# Patient Record
Sex: Female | Born: 1950 | Race: White | Hispanic: No | Smoking: Never smoker
Health system: Southern US, Community
[De-identification: ages and names within clinical notes are randomized; demographics above are authoritative.]

## PROBLEM LIST (undated history)

## (undated) DIAGNOSIS — F419 Anxiety disorder, unspecified: Secondary | ICD-10-CM

## (undated) DIAGNOSIS — I340 Nonrheumatic mitral (valve) insufficiency: Secondary | ICD-10-CM

## (undated) DIAGNOSIS — F32A Depression, unspecified: Secondary | ICD-10-CM

## (undated) DIAGNOSIS — C50919 Malignant neoplasm of unspecified site of unspecified female breast: Secondary | ICD-10-CM

## (undated) DIAGNOSIS — Z9221 Personal history of antineoplastic chemotherapy: Secondary | ICD-10-CM

## (undated) DIAGNOSIS — R519 Headache, unspecified: Secondary | ICD-10-CM

## (undated) DIAGNOSIS — Z923 Personal history of irradiation: Secondary | ICD-10-CM

## (undated) DIAGNOSIS — M810 Age-related osteoporosis without current pathological fracture: Secondary | ICD-10-CM

## (undated) HISTORY — DX: Nonrheumatic mitral (valve) insufficiency: I34.0

## (undated) HISTORY — PX: APPENDECTOMY: SHX54

## (undated) HISTORY — DX: Malignant neoplasm of unspecified site of unspecified female breast: C50.919

## (undated) HISTORY — DX: Depression, unspecified: F32.A

## (undated) HISTORY — DX: Headache, unspecified: R51.9

## (undated) HISTORY — PX: BREAST LUMPECTOMY: SHX2

## (undated) HISTORY — DX: Age-related osteoporosis without current pathological fracture: M81.0

## (undated) HISTORY — PX: CHOLECYSTECTOMY: SHX55

## (undated) HISTORY — DX: Anxiety disorder, unspecified: F41.9

---

## 1999-06-02 DIAGNOSIS — C50919 Malignant neoplasm of unspecified site of unspecified female breast: Secondary | ICD-10-CM

## 1999-06-02 HISTORY — DX: Malignant neoplasm of unspecified site of unspecified female breast: C50.919

## 1999-06-02 HISTORY — PX: BREAST LUMPECTOMY: SHX2

## 2010-06-01 HISTORY — PX: GALLBLADDER SURGERY: SHX652

## 2016-09-04 ENCOUNTER — Encounter: Payer: Self-pay | Admitting: Interventional Cardiology

## 2016-09-22 ENCOUNTER — Ambulatory Visit (INDEPENDENT_AMBULATORY_CARE_PROVIDER_SITE_OTHER): Payer: Medicare Other | Admitting: Interventional Cardiology

## 2016-09-22 ENCOUNTER — Encounter (INDEPENDENT_AMBULATORY_CARE_PROVIDER_SITE_OTHER): Payer: Self-pay

## 2016-09-22 ENCOUNTER — Encounter: Payer: Self-pay | Admitting: Interventional Cardiology

## 2016-09-22 VITALS — BP 120/80 | HR 51 | Ht 64.0 in | Wt 130.8 lb

## 2016-09-22 DIAGNOSIS — R5383 Other fatigue: Secondary | ICD-10-CM

## 2016-09-22 DIAGNOSIS — I340 Nonrheumatic mitral (valve) insufficiency: Secondary | ICD-10-CM

## 2016-09-22 DIAGNOSIS — R002 Palpitations: Secondary | ICD-10-CM

## 2016-09-22 NOTE — Patient Instructions (Signed)
Medication Instructions:  Your physician has recommended you make the following change in your medication:  Stop taking atenolol.   Labwork: None ordered.  Testing/Procedures: None ordered.  Follow-Up: Your physician recommends that you schedule a follow-up appointment in: 4-6 weeks with Dr. Irish Lack.   Any Other Special Instructions Will Be Listed Below (If Applicable).     If you need a refill on your cardiac medications before your next appointment, please call your pharmacy.Marland Kitchen

## 2016-09-22 NOTE — Progress Notes (Signed)
Cardiology Office Note   Date:  09/22/2016   ID:  Mariah Romero, DOB [birth year], MRN [id number]  PCP:  Marda Stalker, PA-C    No chief complaint on file.  palpitations  Wt Readings from Last 3 Encounters:  09/22/16 130 lb 12.8 oz (59.3 kg)       History of Present Illness: Mariah Romero is a 66 y.o. female who is being seen today for the evaluation of palpitations at the request of Marda Stalker, PA-C.  She was living in [place].  She was seen years ago for palpitations and she had a negative w/u with a monitor.  She was told she had MR as well.  Her last echo was several years ago.  She was supposed to have one in [place]. But the move to [place] happened before she left.  She was told that she was more sensitive to skipped beats than most.  THe patient has had low BP for many years.  Regardless, the patient was started on atenolol for palpitations.  She was told that she could take extra atenolol if beeded for palpitations.    No chest pain.  WOrk around the house is her most strenuous activity.  Since moving to [place] in Nov 2017, she has been less active.  She travels to see family as well.  No SHOB with activity.  She had more palpitations several weeks ago.       Past Medical History:  Diagnosis Date  . Breast cancer ([place]) 2001   S/P CHEMOTHERAPY  . Mitral valve regurgitation   . Osteoporosis     Past Surgical History:  Procedure Laterality Date  . APPENDECTOMY     AT AGE 75 YEARS OLD  . BREAST LUMPECTOMY     AT AGE 82 YEARS OLD     Current Outpatient Prescriptions  Medication Sig Dispense Refill  . b complex vitamins tablet Take 1 tablet by mouth daily.    Marland Kitchen Bioflavonoid Products (ESTER-C) TABS Take 1 tablet by mouth daily.    . Cholecalciferol (VITAMIN D3) LIQD by Does not apply route.    . citalopram (CELEXA) 20 MG tablet Take 20 mg by mouth daily.    . traZODone (DESYREL) 100 MG tablet Take 250 mg by mouth at bedtime.    . Turmeric 500 MG CAPS Take  1 capsule by mouth daily.    . vitamin B-12 (CYANOCOBALAMIN) 100 MCG tablet Take 100 mcg by mouth daily.     No current facility-administered medications for this visit.     Allergies:   Codeine; Ibuprofen; and Penicillins    Social History:  The patient  reports that she has never smoked. She has never used smokeless tobacco. She reports that she drinks alcohol. She reports that she does not use drugs.   Family History:  The patient's family history includes Breast cancer in her sister; CVA in her father; Dementia (age of onset: 78) in her mother; Diabetes in her mother; Hypertension in her mother; Melanoma in her father; Mental illness in her father.    ROS:  Please see the history of present illness.   Otherwise, review of systems are positive for palpitatons (reducing), and chancre sore.   All other systems are reviewed and negative.    PHYSICAL EXAM: VS:  BP 120/80 (BP Location: Left Arm, Patient Position: Sitting, Cuff Size: Normal)   Pulse (!) 51   Ht 5\' 4"  (1.626 m)   Wt 130 lb 12.8 oz (59.3 kg)   SpO2  98%   BMI 22.45 kg/m  , BMI Body mass index is 22.45 kg/m. GEN: Well nourished, well developed, in no acute distress  HEENT: normal  Neck: no JVD, carotid bruits, or masses Cardiac: RRR; 1/6 systolic murmur,; no rubs, or gallops,no edema  Respiratory:  clear to auscultation bilaterally, normal work of breathing GI: soft, nontender, nondistended, + BS MS: no deformity or atrophy  Skin: warm and dry, no rash Neuro:  Strength and sensation are intact Psych: euthymic mood, full affect   EKG:   The ekg ordered today demonstrates sinus bradycardia, no ST segment changes   Recent Labs: No results found for requested labs within last 8760 hours.   Lipid Panel No results found for: CHOL, TRIG, HDL, CHOLHDL, VLDL, LDLCALC, LDLDIRECT   Other studies Reviewed: Additional studies/ records that were reviewed today with results demonstrating: echo and Holter pending from old  records.   ASSESSMENT AND PLAN:  1. Mitral regurgitation: Mild by exam.  She brought a disk that has records.  She will get [place] the echo report and Holter monitor.  She was going to have an echo in [place]. But will wait to see her old report.  Nothing worrisome on exam.   2. Fatigue: Hopefully, this will improve with less atenolol.  She also has a h/o depression.   3. Palpitations: Improved.  Holter in the past was benign per her report.  Should be ok off of atenolol.  Currently bradycardic.  If this HR increases, her energy level may improve.   Current medicines are reviewed at length with the patient today.  The patient concerns regarding her medicines were addressed.  The following changes have been made:  No change Ball[place] and Labs/ tests ordered today include:   Orders Placed This Encounter  Procedures  . EKG 12-Lead    Recommend 150 minutes/week of aerobic exercise Low fat, low carb, high fiber diet recommended  Disposition:   FU in 4-6 weeks to check on HR, BP and palpitations   Signed, Larae Grooms, MD  09/22/2016 3:14 PM    [facility] [place], [place], [place]  [address] Phone: [phone]; Fax: [phone]

## 2016-10-30 ENCOUNTER — Ambulatory Visit: Payer: Medicare Other | Admitting: Interventional Cardiology

## 2016-11-05 ENCOUNTER — Ambulatory Visit: Payer: Medicare Other | Admitting: Interventional Cardiology

## 2016-11-24 ENCOUNTER — Encounter: Payer: Self-pay | Admitting: Interventional Cardiology

## 2016-11-24 ENCOUNTER — Ambulatory Visit (INDEPENDENT_AMBULATORY_CARE_PROVIDER_SITE_OTHER): Payer: Medicare Other | Admitting: Interventional Cardiology

## 2016-11-24 VITALS — BP 130/68 | HR 72 | Ht 64.0 in | Wt 131.8 lb

## 2016-11-24 DIAGNOSIS — I340 Nonrheumatic mitral (valve) insufficiency: Secondary | ICD-10-CM

## 2016-11-24 DIAGNOSIS — R002 Palpitations: Secondary | ICD-10-CM | POA: Diagnosis not present

## 2016-11-24 DIAGNOSIS — R5383 Other fatigue: Secondary | ICD-10-CM | POA: Diagnosis not present

## 2016-11-24 NOTE — Progress Notes (Signed)
Cardiology Office Note   Date:  11/24/2016   ID:  Mariah Romero, DOB 07-04-1950, MRN [id number]  PCP:  Marda Stalker, PA-C    No chief complaint on file. palpitations   Wt Readings from Last 3 Encounters:  11/24/16 131 lb 12.8 oz (59.8 kg)  09/22/16 130 lb 12.8 oz (59.3 kg)       History of Present Illness: Mariah Romero is a 66 y.o. female  Who has had palpitations.  She was living in [place].  She was seen years ago for palpitations and she had a negative w/u with a monitor.  She was told she had MR as well.  Her last echo was several years ago.  She was supposed to have one in [place]. But the move to [place] happened before she left.  She was told that she was more sensitive to skipped beats than most.  THe patient has had low BP for many years.  Regardless, the patient was started on atenolol for palpitations.  She was told that she could take extra atenolol if beeded for palpitations.    She had more palpitations and saw me in April 2018.  We stopped her beta blocker.  Since then, her energy is improved, but she feels more skipped beats.  They typically occur at night.  She is not exercising regularly.  She has silver sneakers available.  Denies : Chest pain. Dizziness. Leg edema. Nitroglycerin use. Orthopnea. Paroxysmal nocturnal dyspnea. Shortness of breath. Syncope.        Past Medical History:  Diagnosis Date  . Breast cancer ([place]) 2001   S/P CHEMOTHERAPY  . Mitral valve regurgitation   . Osteoporosis     Past Surgical History:  Procedure Laterality Date  . APPENDECTOMY     AT AGE 34 YEARS OLD  . BREAST LUMPECTOMY     AT AGE 70 YEARS OLD     Current Outpatient Prescriptions  Medication Sig Dispense Refill  . b complex vitamins tablet Take 1 tablet by mouth daily.    . Cholecalciferol (VITAMIN D3) LIQD by Does not apply route.    . citalopram (CELEXA) 20 MG tablet Take 20 mg by mouth daily.    . traZODone (DESYREL) 100 MG tablet Take 250 mg by  mouth at bedtime.    . Turmeric 500 MG CAPS Take 1 capsule by mouth daily.    . vitamin B-12 (CYANOCOBALAMIN) 100 MCG tablet Take 100 mcg by mouth daily.     No current facility-administered medications for this visit.     Allergies:   Codeine; Ibuprofen; and Penicillins    Social History:  The patient  reports that she has never smoked. She has never used smokeless tobacco. She reports that she drinks alcohol. She reports that she does not use drugs.   Family History:  The patient's family history includes Breast cancer in her sister; CVA in her father; Dementia (age of onset: 17) in her mother; Diabetes in her mother; Hypertension in her mother; Melanoma in her father; Mental illness in her father.    ROS:  Please see the history of present illness.   Otherwise, review of systems are positive for .   All other systems are reviewed and negative.    PHYSICAL EXAM: VS:  BP 130/68   Pulse 72   Ht 5\' 4"  (1.626 m)   Wt 131 lb 12.8 oz (59.8 kg)   SpO2 98%   BMI 22.62 kg/m  , BMI Body mass index is 22.62  kg/m. GEN: Well nourished, well developed, in no acute distress  HEENT: normal  Neck: no JVD, carotid bruits, or masses Cardiac: RRR; 2/6 early systolic murmur, no rubs, or gallops,no edema  Respiratory:  clear to auscultation bilaterally, normal work of breathing GI: soft, nontender, nondistended, + BS MS: no deformity or atrophy  Skin: warm and dry, no rash Neuro:  Strength and sensation are intact Psych: euthymic mood, full affect     Recent Labs: No results found for requested labs within last 8760 hours.   Lipid Panel No results found for: CHOL, TRIG, HDL, CHOLHDL, VLDL, LDLCALC, LDLDIRECT   Other studies Reviewed: Additional studies/ records that were reviewed today with results demonstrating: 2016 echo in [place]. Showed normal LVEF with moderate MR. 2005 Holter showed NSR, no AFib with only PACs. No SVT.   ASSESSMENT AND PLAN:  1. Mitral regurgitation:  Noted by her  prior cardiologist as moderate.  Will check echo and call her with results. 2. Palpitaitons: PACs documented in the past.  Sx are not bothersome.  Only occur when she is still.  She prefers being off of the beta blocker.  3. Fatigue: Improved off of beta blocker.   Current medicines are reviewed at length with the patient today.  The patient concerns regarding her medicines were addressed.  The following changes have been made:  No change  Labs/ tests ordered today include: echo  Orders Placed This Encounter  Procedures  . ECHOCARDIOGRAM COMPLETE    Recommend 150 minutes/week of aerobic exercise Low fat, low carb, high fiber diet recommended  Disposition:   FU in 1 year   Signed, Larae Grooms, MD  11/24/2016 1:02 PM    [facility] [place], [place], [place]  [address] Phone: [phone]; Fax: [phone]

## 2016-11-24 NOTE — Patient Instructions (Signed)
Medication Instructions:  Your physician recommends that you continue on your current medications as directed. Please refer to the Current Medication list given to you today.   Labwork: None ordered  Testing/Procedures: Your physician has requested that you have an echocardiogram. Echocardiography is a painless test that uses sound waves to create images of your heart. It provides your doctor with information about the size and shape of your heart and how well your heart's chambers and valves are working. This procedure takes approximately one hour. There are no restrictions for this procedure.  Follow-Up: Your physician wants you to follow-up in: 1 year with Dr. Varanasi. You will receive a reminder letter in the mail two months in advance. If you don't receive a letter, please call our office to schedule the follow-up appointment.   Any Other Special Instructions Will Be Listed Below (If Applicable).  Echocardiogram An echocardiogram, or echocardiography, uses sound waves (ultrasound) to produce an image of your heart. The echocardiogram is simple, painless, obtained within a short period of time, and offers valuable information to your health care provider. The images from an echocardiogram can provide information such as:  Evidence of coronary artery disease (CAD).  Heart size.  Heart muscle function.  Heart valve function.  Aneurysm detection.  Evidence of a past heart attack.  Fluid buildup around the heart.  Heart muscle thickening.  Assess heart valve function.  Tell a health care provider about:  Any allergies you have.  All medicines you are taking, including vitamins, herbs, eye drops, creams, and over-the-counter medicines.  Any problems you or family members have had with anesthetic medicines.  Any blood disorders you have.  Any surgeries you have had.  Any medical conditions you have.  Whether you are pregnant or may be pregnant. What happens before  the procedure? No special preparation is needed. Eat and drink normally. What happens during the procedure?  In order to produce an image of your heart, gel will be applied to your chest and a wand-like tool (transducer) will be moved over your chest. The gel will help transmit the sound waves from the transducer. The sound waves will harmlessly bounce off your heart to allow the heart images to be captured in real-time motion. These images will then be recorded.  You may need an IV to receive a medicine that improves the quality of the pictures. What happens after the procedure? You may return to your normal schedule including diet, activities, and medicines, unless your health care provider tells you otherwise. This information is not intended to replace advice given to you by your health care provider. Make sure you discuss any questions you have with your health care provider. Document Released: 05/15/2000 Document Revised: 01/04/2016 Document Reviewed: 01/23/2013 Elsevier Interactive Patient Education  2017 Elsevier Inc.    If you need a refill on your cardiac medications before your next appointment, please call your pharmacy.   

## 2016-12-08 ENCOUNTER — Ambulatory Visit (HOSPITAL_COMMUNITY): Payer: Medicare Other | Attending: Internal Medicine

## 2016-12-08 ENCOUNTER — Other Ambulatory Visit: Payer: Self-pay

## 2016-12-08 DIAGNOSIS — I340 Nonrheumatic mitral (valve) insufficiency: Secondary | ICD-10-CM

## 2016-12-08 DIAGNOSIS — I081 Rheumatic disorders of both mitral and tricuspid valves: Secondary | ICD-10-CM | POA: Insufficient documentation

## 2017-10-07 ENCOUNTER — Ambulatory Visit: Payer: Medicare Other | Admitting: Internal Medicine

## 2017-10-07 ENCOUNTER — Ambulatory Visit (INDEPENDENT_AMBULATORY_CARE_PROVIDER_SITE_OTHER)
Admission: RE | Admit: 2017-10-07 | Discharge: 2017-10-07 | Disposition: A | Discharge status: Home / Self Care | Payer: Medicare Other | Source: Ambulatory Visit | Attending: Internal Medicine | Admitting: Internal Medicine

## 2017-10-07 ENCOUNTER — Encounter: Payer: Self-pay | Admitting: Internal Medicine

## 2017-10-07 VITALS — BP 130/74 | HR 67 | Ht 64.0 in | Wt 132.0 lb

## 2017-10-07 DIAGNOSIS — J45991 Cough variant asthma: Secondary | ICD-10-CM | POA: Insufficient documentation

## 2017-10-07 LAB — NITRIC OXIDE: Nitric Oxide: 9

## 2017-10-07 NOTE — Progress Notes (Signed)
Subjective:     Patient ID: Mariah Romero, female   DOB: 1950/07/08,    MRN: [id number]  HPI  22 yowf never smoker healthy childhood growing up in [place] dx breast ca 2001  rx s/p chemo adriamycin/cytoxan/taxotere / RT R side completed around 2001 > shortly thereafter developed runny nose year round ever since "constant"  Daytime but  not noct then cough started around 2014 also year round but also absent sleeping but present p first cup of coffee so referred to pulmonary clinic 10/07/2017 by Dr   Harlene Ramus.   10/07/2017 1st [facility] office visit/ Araf Clugston   Chief Complaint  Patient presents with  . Pulmonary Consult    Referred by Dr. Radene Ou. Pt c/o cough for the past several years. She states she usually coughs up clear sputum, esp worse in the morning. She states when she coughs you can hear wheezing. She states "feel like there's a lump in my throat".    persistent daily watery rhinitis x 2001 and cough x 2014  Good ex tol but ? More severe cough  with walking  Uses lots of cough drops/ otc cold /allergy meds don't help or aren't tol well   Cleaning L ear provokes cough x 30 y  On prednisone now for tennis elbow x one week no effect on nose or cough     Kouffman Reflux v Neurogenic Cough Differentiator Reflux Comments  Do you awaken from a sound sleep coughing violently?                            With trouble breathing? 2-3 mon   Do you have choking episodes when you cannot  Get enough air, gasping for air ?              Yes lose voice   Do you usually cough when you lie down into  The bed, or when you just lie down to rest ?                          Every noct   Do you usually cough after meals or eating?         No    Do you cough when (or after) you bend over?    no   GERD SCORE     Kouffman Reflux v Neurogenic Cough Differentiator Neurogenic   Do you more-or-less cough all day long? sporadic   Does change of temperature make you cough? no   Does laughing or  chuckling cause you to cough? often   Do fumes (perfume, automobile fumes, burned  Toast, etc.,) cause you to cough ?      chlorox   Does speaking, singing, or talking on the phone cause you to cough   ?               Yes some    Neurogenic/Airway score      No obvious day to day or daytime variability or assoc excess/ purulent sputum or mucus plugs or hemoptysis or cp or chest tightness, subjective wheeze or overt sinus or hb symptoms. No unusual exposure hx or h/o childhood pna/ asthma or knowledge of premature birth.  Sleeping  Flat ok   without nocturnal  or early am exacerbation  of respiratory  c/o's or need for noct saba. Also denies any obvious fluctuation of symptoms with weather or environmental changes or other aggravating  or alleviating factors except as outlined above   Current Allergies, Complete Past Medical History, Past Surgical History, Family History, and Social History were reviewed in [employer] record.  ROS  The following are not active complaints unless bolded Hoarseness, sore throat, dysphagia, dental problems, itching, sneezing,  nasal congestion or discharge of excess watery mucus  or purulent secretions, ear ache,   fever, chills, sweats, unintended wt loss or wt gain, classically pleuritic or exertional cp,  orthopnea pnd or arm/hand swelling  or leg swelling, presyncope, palpitations, abdominal pain, anorexia, nausea, vomiting, diarrhea  or change in bowel habits or change in bladder habits, change in stools or change in urine, dysuria, hematuria,  rash, arthralgias, visual complaints, headache, numbness, weakness or ataxia or problems with walking or coordination,  change in mood or  memory.        Current Meds  Medication Sig  . b complex vitamins tablet Take 1 tablet by mouth daily.  . Cholecalciferol (VITAMIN D3) LIQD by Does not apply route.  . citalopram (CELEXA) 20 MG tablet Take 20 mg by mouth daily.  . traZODone (DESYREL) 100 MG  tablet Take 250 mg by mouth at bedtime.  . Turmeric 500 MG CAPS Take 1 capsule by mouth daily.  . vitamin B-12 (CYANOCOBALAMIN) 100 MCG tablet Take 100 mcg by mouth daily.     Review of Systems     Objective:   Physical Exam amb wf nad freq throat clearing    Wt Readings from Last 3 Encounters:  10/07/17 132 lb (59.9 kg)  11/24/16 131 lb 12.8 oz (59.8 kg)  09/22/16 130 lb 12.8 oz (59.3 kg)     Vital signs reviewed - Note on arrival 02 sats  96% on RA        HEENT: nl dentition, turbinates bilaterally, and oropharynx. Nl external ear canals with cough reflex on L    NECK :  without JVD/Nodes/TM/ nl carotid upstrokes bilaterally   LUNGS: no acc muscle use,  Nl contour chest which is clear to A and P bilaterally with cough on   exp maneuvers   CV:  RRR  no s3 or murmur or increase in P2, and no edema   ABD:  soft and nontender with nl inspiratory excursion in the supine position. No bruits or organomegaly appreciated, bowel sounds nl  MS:  Nl gait/ ext warm without deformities, calf tenderness, cyanosis or clubbing No obvious joint restrictions   SKIN: warm and dry without lesions    NEURO:  alert, approp, nl sensorium with  no motor or cerebellar deficits apparent.    CXR PA and Lateral:   10/07/2017 :    I personally reviewed images and  impression as follows:    pleural thickening R apex o/w wnl      Assessment:

## 2017-10-07 NOTE — Patient Instructions (Addendum)
Dysmista one puff twice daily in each nostril until you use it up and refill it if it's helping.  GERD (REFLUX)  is an extremely common cause of respiratory symptoms just like yours , many times with no obvious heartburn at all.    It can be treated with medication, but also with lifestyle changes including elevation of the head of your bed (ideally with 6 inch  bed blocks),  Smoking cessation, avoidance of late meals, excessive alcohol, and avoid fatty foods, chocolate, peppermint, colas, red wine, and acidic juices such as orange juice.  NO MINT OR MENTHOL PRODUCTS SO NO COUGH DROPS   USE SUGARLESS CANDY INSTEAD (Jolley ranchers or Stover's or Life Savers) or even ice chips will also do - the key is to swallow to prevent all throat clearing. NO OIL BASED VITAMINS - use powdered substitutes.  For drainage / throat tickle try take CHLORPHENIRAMINE  4 mg - take one every 4 hours as needed - available over the counter- may cause drowsiness so start with just a  dose or two one hour before bedtime and see how you tolerate it before trying in daytime    If still coughing :  Next step try prilosec otc 20mg   Take 30-60 min before first meal of the day and Pepcid ac (famotidine) 20 mg one hour before @  bedtime until return - both are over the counter but have to be taken perfectly regularly to be of help      Please remember to go to the  x-ray department downstairs in the basement  for your tests - we will call you with the results when they are available.      Please schedule a follow up office visit in 4 weeks, sooner if needed

## 2017-10-08 ENCOUNTER — Encounter: Payer: Self-pay | Admitting: Internal Medicine

## 2017-10-08 MED ORDER — AZELASTINE-FLUTICASONE 137-50 MCG/ACT NA SUSP: 1.0000 | Freq: Two times a day (BID) | NASAL | Status: DC

## 2017-10-08 NOTE — Progress Notes (Signed)
Spoke with pt and notified of results per Dr. Wert. Pt verbalized understanding and denied any questions. 

## 2017-10-08 NOTE — Assessment & Plan Note (Addendum)
L ear cough since she was in her 30's  - Spirometry 10/07/2017 wnl prior to any saba while on pred for elbow  -  FENO 10/07/2017  =   9  On prednisone for elbow which did not help cough   - 10/07/2017 trial of dymista and 1st gen H1 blockers per guidelines  If tol    The most common causes of chronic cough in immunocompetent adults include the following: upper airway cough syndrome (UACS), previously referred to as postnasal drip syndrome (PNDS), which is caused by variety of rhinosinus conditions; (2) asthma; (3) GERD; (4) chronic bronchitis from cigarette smoking or other inhaled environmental irritants; (5) nonasthmatic eosinophilic bronchitis; and (6) bronchiectasis.   These conditions, singly or in combination, have accounted for up to 94% of the causes of chronic cough in prospective studies.   Other conditions have constituted no >6% of the causes in prospective studies These have included bronchogenic carcinoma, chronic interstitial pneumonia, sarcoidosis, left ventricular failure, ACEI-induced cough, and aspiration from a condition associated with pharyngeal dysfunction.    Chronic cough is often simultaneously caused by more than one condition. A single cause has been found from 38 to 82% of the time, multiple causes from 18 to 62%. Multiply caused cough has been the result of three diseases up to 42% of the time.    Longstanding pnds suggest this is the mech for cough esp assoc with L vagal cough reflex and the lack of resolution on pred is against eos rhinitis/ bronchits and asthma but the cough on exp is typical of asthma so there is no clear dx here.  Pt insists she doesn't do well with meds so reasoable to work thru the ddx of chronic cough step by step starting with addressing her sense of pnds first but helping her to understand that throat clearing just fuels the fire and she may be able to prevent this by something simple  using non min/ menthol containing hard rock candies.  In  meantime will try to eliminate cholinergically mediated cough with Dymista/1st gen H1 blockers per guidelines  If tol and if not then try singulair/ allergy testing when returns  Reviewed in detail: The standardized cough guidelines published in Chest by Lissa Morales in 2006 are still the best available and consist of a multiple step process (up to 12!) , not a single office visit,  and are intended  to address this problem logically,  with an alogrithm dependent on response to empiric treatment at  each progressive step  to determine a specific diagnosis with  minimal addtional testing needed. Therefore if adherence is an issue or can't be accurately verified,  it's very unlikely the standard evaluation and treatment will be successful here.    Furthermore, response to therapy (other than acute cough suppression, which should only be used short term with avoidance of narcotic containing cough syrups if possible), can be a gradual process for which the patient is not likely to  perceive immediate benefit.  Unlike going to an eye doctor where the best perscription is almost always the first one and is immediately effective, this is almost never the case in the management of chronic cough syndromes. Therefore the patient needs to commit up front to consistently adhere to recommendations  for up to 6 weeks of therapy directed at the likely underlying problem(s) before the response can be reasonably evaluated.    Total time devoted to counseling  > 50 % of initial 60 min office visit:  review case with pt/ discussion of options/alternatives/ personally creating written customized instructions  in presence of pt  then going over those specific  Instructions directly with the pt including how to use all of the meds but in particular covering each new medication in detail and the difference between the maintenance= "automatic" meds and the prns using an action plan format for the latter (If this problem/symptom  => do that organization reading Left to right).  Please see AVS from this visit for a full list of these instructions which I personally wrote for this pt and  are unique to this visit.     device teaching [facility])  extended face to face time for this visit

## 2017-11-05 ENCOUNTER — Ambulatory Visit: Payer: Medicare Other | Admitting: Internal Medicine

## 2018-06-23 ENCOUNTER — Other Ambulatory Visit: Payer: Self-pay | Admitting: Gastroenterology

## 2018-06-23 DIAGNOSIS — R1084 Generalized abdominal pain: Secondary | ICD-10-CM

## 2018-06-29 ENCOUNTER — Encounter: Payer: Self-pay | Admitting: Radiology

## 2018-06-29 ENCOUNTER — Ambulatory Visit
Admission: RE | Admit: 2018-06-29 | Discharge: 2018-06-29 | Disposition: A | Discharge status: Home / Self Care | Payer: Medicare Other | Source: Ambulatory Visit | Attending: Gastroenterology | Admitting: Gastroenterology

## 2018-06-29 DIAGNOSIS — R1084 Generalized abdominal pain: Secondary | ICD-10-CM

## 2018-06-29 MED ORDER — IOPAMIDOL (ISOVUE-300) INJECTION 61%
100.0000 mL | Freq: Once | INTRAVENOUS | Status: AC | PRN
  Administered 2018-06-29: 100 mL via INTRAVENOUS

## 2019-03-14 ENCOUNTER — Other Ambulatory Visit: Payer: Self-pay | Admitting: Family Medicine

## 2019-03-14 DIAGNOSIS — Z13820 Encounter for screening for osteoporosis: Secondary | ICD-10-CM

## 2019-04-18 ENCOUNTER — Other Ambulatory Visit: Payer: Self-pay | Admitting: Family Medicine

## 2019-04-18 DIAGNOSIS — Z1231 Encounter for screening mammogram for malignant neoplasm of breast: Secondary | ICD-10-CM

## 2019-06-21 ENCOUNTER — Ambulatory Visit
Admission: RE | Admit: 2019-06-21 | Discharge: 2019-06-21 | Disposition: A | Discharge status: Home / Self Care | Payer: Medicare Other | Source: Ambulatory Visit | Attending: Family Medicine | Admitting: Family Medicine

## 2019-06-21 ENCOUNTER — Other Ambulatory Visit: Payer: Self-pay

## 2019-06-21 DIAGNOSIS — Z1231 Encounter for screening mammogram for malignant neoplasm of breast: Secondary | ICD-10-CM

## 2019-06-21 DIAGNOSIS — Z13820 Encounter for screening for osteoporosis: Secondary | ICD-10-CM

## 2019-06-26 ENCOUNTER — Telehealth: Payer: Self-pay | Admitting: Internal Medicine

## 2019-06-26 NOTE — Telephone Encounter (Signed)
Called the patient back and advised of response. Patient voiced understanding. Nothing further needed at this time.

## 2019-06-26 NOTE — Telephone Encounter (Signed)
Yes, needs to get it, no contraindication to my knowledge

## 2019-06-26 NOTE — Telephone Encounter (Signed)
Dr. Melvyn Novas, this patient has not been seen by you since 10/07/17. Based on that office note, is there any reason why she should not obtain vaccination? Please advise, thank you.

## 2019-07-31 ENCOUNTER — Ambulatory Visit: Payer: Medicare Other | Attending: Internal Medicine

## 2019-07-31 DIAGNOSIS — Z23 Encounter for immunization: Secondary | ICD-10-CM | POA: Insufficient documentation

## 2019-07-31 NOTE — Progress Notes (Signed)
   Covid-19 Vaccination Clinic  Name:  Mariah Romero    MRN: [id number] DOB: 10-15-1950  07/31/2019  Ms. Willmann was observed post Covid-19 immunization for 15 minutes without incidence. She was provided with Vaccine Information Sheet and instruction to access the V-Safe system.   Ms. Barfield was instructed to call 911 with any severe reactions post vaccine: Marland Kitchen Difficulty breathing  . Swelling of your face and throat  . A fast heartbeat  . A bad rash all over your body  . Dizziness and weakness    Immunizations Administered    Name Date Dose VIS Date Route   Pfizer COVID-19 Vaccine 07/31/2019 11:47 AM 0.3 mL 05/12/2019 Intramuscular   Manufacturer: [place]   Lot: [id number]   [place]: [id number]

## 2019-08-29 ENCOUNTER — Ambulatory Visit: Payer: Medicare Other | Attending: Internal Medicine

## 2019-08-29 DIAGNOSIS — Z23 Encounter for immunization: Secondary | ICD-10-CM

## 2019-08-29 NOTE — Progress Notes (Signed)
   Covid-19 Vaccination Clinic  Name:  Mariah Romero    MRN: [id number] DOB: 1950-11-14  08/29/2019  [name] was observed post Covid-19 immunization for 15 minutes without incident. She was provided with Vaccine Information Sheet and instruction to access the V-Safe system.   [name] was instructed to call 911 with any severe reactions post vaccine: Marland Kitchen Difficulty breathing  . Swelling of face and throat  . A fast heartbeat  . A bad rash all over body  . Dizziness and weakness   Immunizations Administered    Name Date Dose VIS Date Route   Pfizer COVID-19 Vaccine 08/29/2019 12:01 PM 0.3 mL 05/12/2019 Intramuscular   Manufacturer: [employer]   Lot: [id number]   [place]: [id number]

## 2019-09-11 NOTE — Progress Notes (Signed)
Cardiology Office Note   Date:  09/12/2019   ID:  Mariah Romero, DOB 1951/04/25, MRN [id number]  PCP:  Vernie Shanks, MD    No chief complaint on file.  Mitral regurgitation  Wt Readings from Last 3 Encounters:  09/12/19 122 lb (55.3 kg)  10/07/17 132 lb (59.9 kg)  11/24/16 131 lb 12.8 oz (59.8 kg)       History of Present Illness: Mariah Romero is a 70 y.o. female   Who has had palpitations.  She was living in [place]. She was seen years ago for palpitations and she had a negative w/u with a monitor. She was told she had MR as well. Her last echo was several years ago. She was supposed to have one in [place]. But the move to [place] happened before she left. She was told that she was more sensitive to skipped beats than most.  THe patient has had low BP for many years. Regardless, the patient was started on atenolol for palpitations. She was told that she could take extra atenolol if beeded for palpitations.   She had more palpitations and saw me in April 2018.  We stopped her beta blocker.    After that, her energy improved, but she felt more skipped beats.  They typically occurred at night.  Since the last visit, she has done well. It took about a month to adjust to being off atenolol.  She is happy she is off of this.  She is cutting down on the trazodone.   She is trying to get more walking in.  Her mother with Alzheimers moved in to her home.   Denies : Chest pain. Dizziness. Leg edema. Nitroglycerin use. Orthopnea. Palpitations. Paroxysmal nocturnal dyspnea.  Syncope.    She has some DOE.    Past Medical History:  Diagnosis Date  . Breast cancer ([place]) 2001   S/P CHEMOTHERAPY  . Mitral valve regurgitation   . Osteoporosis     Past Surgical History:  Procedure Laterality Date  . APPENDECTOMY     AT AGE 67 YEARS OLD  . BREAST LUMPECTOMY Right 2001   AT AGE 47 YEARS OLD     Current Outpatient Medications  Medication Sig Dispense Refill  .  Cholecalciferol (VITAMIN D3) LIQD by Does not apply route.    . citalopram (CELEXA) 20 MG tablet Take 20 mg by mouth daily.    . Multiple Vitamins-Minerals (CENTRUM SILVER PO) Take 1 tablet by mouth daily.    . traZODone (DESYREL) 50 MG tablet Take 50 mg by mouth at bedtime.     . vitamin B-12 (CYANOCOBALAMIN) 100 MCG tablet Take 1,000 mcg by mouth daily.      No current facility-administered medications for this visit.    Allergies:   Codeine, Oxycodone, Ibuprofen, and Penicillins    Social History:  The patient  reports that she has never smoked. She has never used smokeless tobacco. She reports current alcohol use. She reports that she does not use drugs.   Family History:  The patient's family history includes Asthma in her sister; Breast cancer in her sister; CVA in her father; Dementia (age of onset: 60) in her mother; Diabetes in her mother; Emphysema in her paternal grandmother; Hypertension in her mother; Melanoma in her father; Mental illness in her father.    ROS:  Please see the history of present illness.   Otherwise, review of systems are positive for stress, from mother moving in.   All other systems are  reviewed and negative.    PHYSICAL EXAM: VS:  BP 134/78   Pulse 63   Ht 5\' 4"  (1.626 m)   Wt 122 lb (55.3 kg)   SpO2 99%   BMI 20.94 kg/m  , BMI Body mass index is 20.94 kg/m. GEN: Well nourished, well developed, in no acute distress  HEENT: normal  Neck: no JVD, carotid bruits, or masses Cardiac: RRR; no murmurs, rubs, or gallops,no edema  Respiratory:  clear to auscultation bilaterally, normal work of breathing GI: soft, nontender, nondistended, + BS MS: no deformity or atrophy  Skin: warm and dry, no rash Neuro:  Strength and sensation are intact Psych: euthymic mood, full affect   EKG:   The ekg ordered today demonstrates NSR, RBBB   Recent Labs: No results found for requested labs within last 8760 hours.   Lipid Panel No results found for: CHOL,  TRIG, HDL, CHOLHDL, VLDL, LDLCALC, LDLDIRECT   Other studies Reviewed: Additional studies/ records that were reviewed today with results demonstrating: LDL 124  In 8/18.   ASSESSMENT AND PLAN:  1. Mitral regurgitation: Noted to be moderate in the past while in [place].  It was mild on the July 2018 echocardiogram done in [place].   2. Palpitations: She has had PACs documented in the past.  She was treated on atenolol in the past.  She feels better off of atenolol.  3. Fatigue: This was worse when she was taking a beta-blocker. 4. DOE: Mild.  She feels this is a chronic pulmonary issue.    Increase exercise to improve stamina.  SHe is interested in more exercise to help prevent dementia.    Current medicines are reviewed at length with the patient today.  The patient concerns regarding her medicines were addressed.  The following changes have been made:  No change  Labs/ tests ordered today include:  No orders of the defined types were placed in this encounter.   Recommend 150 minutes/week of aerobic exercise Low fat, low carb, high fiber diet recommended  Disposition:   FU in 1 year   Signed, Larae Grooms, MD  09/12/2019 2:28 PM    [facility] [address], [place], [place]  [address] Phone: [phone]; Fax: [phone]

## 2019-09-12 ENCOUNTER — Ambulatory Visit: Payer: Medicare Other | Admitting: Interventional Cardiology

## 2019-09-12 ENCOUNTER — Other Ambulatory Visit: Payer: Self-pay

## 2019-09-12 ENCOUNTER — Encounter: Payer: Self-pay | Admitting: Interventional Cardiology

## 2019-09-12 VITALS — BP 134/78 | HR 63 | Ht 64.0 in | Wt 122.0 lb

## 2019-09-12 DIAGNOSIS — I340 Nonrheumatic mitral (valve) insufficiency: Secondary | ICD-10-CM | POA: Diagnosis not present

## 2019-09-12 DIAGNOSIS — R002 Palpitations: Secondary | ICD-10-CM

## 2019-09-12 DIAGNOSIS — R5383 Other fatigue: Secondary | ICD-10-CM

## 2019-09-12 NOTE — Patient Instructions (Signed)
Medication Instructions:  Your physician recommends that you continue on your current medications as directed. Please refer to the Current Medication list given to you today.  *If you need a refill on your cardiac medications before your next appointment, please call your pharmacy*   Lab Work: None ordered  If you have labs (blood work) drawn today and your tests are completely normal, you will receive your results only by: Marland Kitchen MyChart Message (if you have MyChart) OR . A paper copy in the mail If you have any lab test that is abnormal or we need to change your treatment, we will call you to review the results.   Testing/Procedures: None ordered   Follow-Up: At [facility], you and your health needs are our priority.  As part of our continuing mission to provide you with exceptional heart care, we have created designated Provider Care Teams.  These Care Teams include your primary Cardiologist (physician) and Advanced Practice Providers (APPs -  Physician Assistants and Nurse Practitioners) who all work together to provide you with the care you need, when you need it.  We recommend signing up for the patient portal called "MyChart".  Sign up information is provided on this After Visit Summary.  MyChart is used to connect with patients for Virtual Visits (Telemedicine).  Patients are able to view lab/test results, encounter notes, upcoming appointments, etc.  Non-urgent messages can be sent to your provider as well.   To learn more about what you can do with MyChart, go to [email or web address].    Your next appointment:   2 year(s)  The format for your next appointment:   In Person  Provider:   You may see Casandra Doffing, MD or one of the following Advanced Practice Providers on your designated Care Team:    Melina Copa, PA-C  Ermalinda Barrios, PA-C    Other Instructions  High-Fiber Diet Fiber, also called dietary fiber, is a type of carbohydrate that is found in fruits,  vegetables, whole grains, and beans. A high-fiber diet can have many health benefits. Your health care provider may recommend a high-fiber diet to help:  Prevent constipation. Fiber can make your bowel movements more regular.  Lower your cholesterol.  Relieve the following conditions: ? Swelling of veins in the anus (hemorrhoids). ? Swelling and irritation (inflammation) of specific areas of the digestive tract (uncomplicated diverticulosis). ? A problem of the large intestine (colon) that sometimes causes pain and diarrhea (irritable bowel syndrome, IBS).  Prevent overeating as part of a weight-loss plan.  Prevent heart disease, type 2 diabetes, and certain cancers. What is my plan? The recommended daily fiber intake in grams (g) includes:  38 g for men age 70 or younger.  30 g for men over age 42.  46 g for women age 59 or younger.  21 g for women over age 84. You can get the recommended daily intake of dietary fiber by:  Eating a variety of fruits, vegetables, grains, and beans.  Taking a fiber supplement, if it is not possible to get enough fiber through your diet. What do I need to know about a high-fiber diet?  It is better to get fiber through food sources rather than from fiber supplements. There is not a lot of research about how effective supplements are.  Always check the fiber content on the nutrition facts label of any prepackaged food. Look for foods that contain 5 g of fiber or more per serving.  Talk with a diet and nutrition specialist (  dietitian) if you have questions about specific foods that are recommended or not recommended for your medical condition, especially if those foods are not listed below.  Gradually increase how much fiber you consume. If you increase your intake of dietary fiber too quickly, you may have bloating, cramping, or gas.  Drink plenty of water. Water helps you to digest fiber. What are tips for following this plan?  Eat a wide  variety of high-fiber foods.  Make sure that half of the grains that you eat each day are whole grains.  Eat breads and cereals that are made with whole-grain flour instead of refined flour or white flour.  Eat brown rice, bulgur wheat, or millet instead of white rice.  Start the day with a breakfast that is high in fiber, such as a cereal that contains 5 g of fiber or more per serving.  Use beans in place of meat in soups, salads, and pasta dishes.  Eat high-fiber snacks, such as berries, raw vegetables, nuts, and popcorn.  Choose whole fruits and vegetables instead of processed forms like juice or sauce. What foods can I eat?  Fruits Berries. Pears. Apples. Oranges. Avocado. Prunes and raisins. Dried figs. Vegetables Sweet potatoes. Spinach. Kale. Artichokes. Cabbage. Broccoli. Cauliflower. Green peas. Carrots. Squash. Grains Whole-grain breads. Multigrain cereal. Oats and oatmeal. Brown rice. Barley. Bulgur wheat. Millet. Quinoa. Bran muffins. Popcorn. Rye wafer crackers. Meats and other proteins Navy, kidney, and pinto beans. Soybeans. Split peas. Lentils. Nuts and seeds. Dairy Fiber-fortified yogurt. Beverages Fiber-fortified soy milk. Fiber-fortified orange juice. Other foods Fiber bars. The items listed above may not be a complete list of recommended foods and beverages. Contact a dietitian for more options. What foods are not recommended? Fruits Fruit juice. Cooked, strained fruit. Vegetables Fried potatoes. Canned vegetables. Well-cooked vegetables. Grains White bread. Pasta made with refined flour. White rice. Meats and other proteins Fatty cuts of meat. Fried chicken or fried fish. Dairy Milk. Yogurt. Cream cheese. Sour cream. Fats and oils Butters. Beverages Soft drinks. Other foods Cakes and pastries. The items listed above may not be a complete list of foods and beverages to avoid. Contact a dietitian for more information. Summary  Fiber is a type of  carbohydrate. It is found in fruits, vegetables, whole grains, and beans.  There are many health benefits of eating a high-fiber diet, such as preventing constipation, lowering blood cholesterol, helping with weight loss, and reducing your risk of heart disease, diabetes, and certain cancers.  Gradually increase your intake of fiber. Increasing too fast can result in cramping, bloating, and gas. Drink plenty of water while you increase your fiber.  The best sources of fiber include whole fruits and vegetables, whole grains, nuts, seeds, and beans. This information is not intended to replace advice given to you by your health care provider. Make sure you discuss any questions you have with your health care provider. Document Revised: 03/22/2017 Document Reviewed: 03/22/2017 Elsevier Patient Education  2020 [employer]   

## 2020-01-19 ENCOUNTER — Emergency Department (HOSPITAL_BASED_OUTPATIENT_CLINIC_OR_DEPARTMENT_OTHER): Payer: Medicare Other

## 2020-01-19 ENCOUNTER — Other Ambulatory Visit: Payer: Self-pay

## 2020-01-19 ENCOUNTER — Encounter (HOSPITAL_BASED_OUTPATIENT_CLINIC_OR_DEPARTMENT_OTHER): Payer: Self-pay | Admitting: *Deleted

## 2020-01-19 ENCOUNTER — Emergency Department (HOSPITAL_BASED_OUTPATIENT_CLINIC_OR_DEPARTMENT_OTHER)
Admission: EM | Admit: 2020-01-19 | Discharge: 2020-01-19 | Disposition: A | Discharge status: Home / Self Care | Payer: Medicare Other | Attending: Emergency Medicine | Admitting: Emergency Medicine

## 2020-01-19 DIAGNOSIS — R519 Headache, unspecified: Secondary | ICD-10-CM | POA: Insufficient documentation

## 2020-01-19 DIAGNOSIS — Z20822 Contact with and (suspected) exposure to covid-19: Secondary | ICD-10-CM | POA: Insufficient documentation

## 2020-01-19 DIAGNOSIS — J019 Acute sinusitis, unspecified: Secondary | ICD-10-CM | POA: Diagnosis not present

## 2020-01-19 DIAGNOSIS — R509 Fever, unspecified: Secondary | ICD-10-CM | POA: Insufficient documentation

## 2020-01-19 DIAGNOSIS — Z853 Personal history of malignant neoplasm of breast: Secondary | ICD-10-CM | POA: Diagnosis not present

## 2020-01-19 DIAGNOSIS — R5383 Other fatigue: Secondary | ICD-10-CM | POA: Insufficient documentation

## 2020-01-19 DIAGNOSIS — R05 Cough: Secondary | ICD-10-CM | POA: Insufficient documentation

## 2020-01-19 DIAGNOSIS — R059 Cough, unspecified: Secondary | ICD-10-CM

## 2020-01-19 LAB — CBC WITH DIFFERENTIAL/PLATELET
Abs Immature Granulocytes: 0.02 10*3/uL (ref 0.00–0.07)
Basophils Absolute: 0 10*3/uL (ref 0.0–0.1)
Basophils Relative: 1 %
Eosinophils Absolute: 0.1 10*3/uL (ref 0.0–0.5)
Eosinophils Relative: 2 %
HCT: 39.9 % (ref 36.0–46.0)
Hemoglobin: 13 g/dL (ref 12.0–15.0)
Immature Granulocytes: 0 %
Lymphocytes Relative: 15 %
Lymphs Abs: 1.1 10*3/uL (ref 0.7–4.0)
MCH: 29.6 pg (ref 26.0–34.0)
MCHC: 32.6 g/dL (ref 30.0–36.0)
MCV: 90.9 fL (ref 80.0–100.0)
Monocytes Absolute: 0.7 10*3/uL (ref 0.1–1.0)
Monocytes Relative: 10 %
Neutro Abs: 5.4 10*3/uL (ref 1.7–7.7)
Neutrophils Relative %: 72 %
Platelets: 267 10*3/uL (ref 150–400)
RBC: 4.39 MIL/uL (ref 3.87–5.11)
RDW: 12.8 % (ref 11.5–15.5)
WBC: 7.4 10*3/uL (ref 4.0–10.5)
nRBC: 0 % (ref 0.0–0.2)

## 2020-01-19 LAB — SARS CORONAVIRUS 2 BY RT PCR (HOSPITAL ORDER, PERFORMED IN ~~LOC~~ HOSPITAL LAB): SARS Coronavirus 2: NEGATIVE

## 2020-01-19 LAB — BASIC METABOLIC PANEL
Anion gap: 9 (ref 5–15)
BUN: 13 mg/dL (ref 8–23)
CO2: 26 mmol/L (ref 22–32)
Calcium: 9.9 mg/dL (ref 8.9–10.3)
Chloride: 97 mmol/L — ABNORMAL LOW (ref 98–111)
Creatinine, Ser: 0.58 mg/dL (ref 0.44–1.00)
GFR calc Af Amer: >60 mL/min (ref >60)
GFR calc non Af Amer: >60 mL/min (ref >60)
Glucose, Bld: 121 mg/dL — ABNORMAL HIGH (ref 70–99)
Potassium: 3.8 mmol/L (ref 3.5–5.1)
Sodium: 132 mmol/L — ABNORMAL LOW (ref 135–145)

## 2020-01-19 MED ORDER — PREDNISONE 20 MG PO TABS: 40.0000 mg | ORAL_TABLET | Freq: Every day | ORAL | 0 refills | Status: AC

## 2020-01-19 MED ORDER — PREDNISONE 20 MG PO TABS: 40.0000 mg | ORAL_TABLET | Freq: Every day | ORAL | 0 refills | Status: DC

## 2020-01-19 MED ORDER — DOXYCYCLINE HYCLATE 100 MG PO TABS: 100.0000 mg | ORAL_TABLET | Freq: Two times a day (BID) | ORAL | 0 refills | Status: DC

## 2020-01-19 MED ORDER — DOXYCYCLINE HYCLATE 100 MG PO TABS: 100.0000 mg | ORAL_TABLET | Freq: Two times a day (BID) | ORAL | 0 refills | Status: AC

## 2020-01-19 NOTE — Discharge Instructions (Addendum)
Take antibiotics as directed. Please take all of your antibiotics until finished.  Use prednisone as directed.   You can use the flonase you have for congestion.  You have RMSF titers pending. These will take a few days to come back. If your results are abnormal, you will be notified.   Return to the Emergency Dept for any difficulty breathing,

## 2020-01-19 NOTE — ED Triage Notes (Signed)
Cough x 15 days. Covid test x 2 have been negative. No pneumonia on CXR. Fever continues. No known Covid exposure.

## 2020-01-19 NOTE — ED Provider Notes (Signed)
[facility]ERGENCY DEPARTMENT Provider Note   CSN: [id number] Arrival date & time: 01/19/20  1645     History Chief Complaint  Patient presents with  . Cough    Mariah Romero is a 69 y.o. female asthma history of breast cancer, atrial valve regurgitation who presents for evaluation of persistent cough, fever, fatigue x3 days.  Patient reports that she was in [place] for the last 5 weeks.  She reports that while she was up there, she started developing a cough.  She states that her normal body temperature is 97 and states that she had been consistently between 98-99 with one episode of a temperature of 100.4.  She states that this is higher than her normal temperature.  She states she has continued to have persistent cough that is sometimes productive of phlegm.  She states she has felt fatigued with no energy.  She has also had intermittent headache.  She does not frequently get headaches.  This headache is not worse with positional changes.  She states it is a headache that is a dull ache over the entire part of her head.  She states that sometimes she has some nausea but denies any vomiting.  She has gotten Covid vaccinated.  Does not know of any COVID-19 exposure.  She was seen twice in [place] for evaluation of symptoms.  She had negative Covid test at that time.  Her chest x-ray was unremarkable.  She was discharged home on a 7-day course of doxycycline which she states she completed.  She denies any chest pain, abdominal pain, vomiting, numbness/weakness of arms or legs.  She does not know of any tick bite, insect bite.  She has not had any rash.  The history is provided by the patient.       Past Medical History:  Diagnosis Date  . Breast cancer ([place]) 2001   S/P CHEMOTHERAPY  . Mitral valve regurgitation   . Osteoporosis     Patient Active Problem List   Diagnosis Date Noted  . Cough variant asthma vs uacs  10/07/2017  . Mitral valve insufficiency 09/22/2016     Past Surgical History:  Procedure Laterality Date  . APPENDECTOMY     AT AGE 54 YEARS OLD  . BREAST LUMPECTOMY Right 2001   AT AGE 46 YEARS OLD     OB History   No obstetric history on file.     Family History  Problem Relation Age of Onset  . Dementia Mother 62  . Diabetes Mother   . Hypertension Mother   . Melanoma Father   . Mental illness Father   . CVA Father   . Breast cancer Sister   . Emphysema Paternal Grandmother        never smoker  . Asthma Sister        had asthma as a child    Social History   Tobacco Use  . Smoking status: Never Smoker  . Smokeless tobacco: Never Used  Vaping Use  . Vaping Use: Never used  Substance Use Topics  . Alcohol use: Yes  . Drug use: No    Home Medications Prior to Admission medications   Medication Sig Start Date End Date Taking? Authorizing Provider  Cholecalciferol (VITAMIN D3) LIQD by Does not apply route.   Yes [provider]  Multiple Vitamins-Minerals (CENTRUM SILVER PO) Take 1 tablet by mouth daily.   Yes [provider]  traZODone (DESYREL) 50 MG tablet Take 50 mg by mouth at  bedtime.    Yes [provider]  vitamin B-12 (CYANOCOBALAMIN) 100 MCG tablet Take 1,000 mcg by mouth daily.    Yes [provider]  citalopram (CELEXA) 20 MG tablet Take 20 mg by mouth daily.    [provider]  doxycycline (VIBRA-TABS) 100 MG tablet Take 1 tablet (100 mg total) by mouth 2 (two) times daily for 7 days. 01/19/20 01/26/20  Volanda Napoleon, PA-C  predniSONE (DELTASONE) 20 MG tablet Take 2 tablets (40 mg total) by mouth daily for 4 days. 01/19/20 01/23/20  Volanda Napoleon, PA-C    Allergies    Codeine, Oxycodone, Ibuprofen, and Penicillins  Review of Systems   Review of Systems  Constitutional: Positive for fatigue and fever.  Respiratory: Positive for cough. Negative for shortness of breath.   Cardiovascular: Negative for chest pain.  Gastrointestinal: Negative for  abdominal pain, nausea and vomiting.  Genitourinary: Negative for dysuria and hematuria.  Neurological: Positive for headaches. Negative for weakness and numbness.  All other systems reviewed and are negative.   Physical Exam Updated Vital Signs BP 135/67 (BP Location: Left Arm)   Pulse 68   Temp 98.3 F (36.8 C) (Oral)   Resp 18   Ht 5\' 4"  (1.626 m)   Wt 53.1 kg   SpO2 99%   BMI 20.08 kg/m   Physical Exam Vitals and nursing note reviewed.  Constitutional:      Appearance: Normal appearance. She is well-developed.     Comments: Sitting comfortably on examination table  HENT:     Head: Normocephalic and atraumatic.  Eyes:     General: Lids are normal.     Conjunctiva/sclera: Conjunctivae normal.     Pupils: Pupils are equal, round, and reactive to light.     Comments: PERRL. EOMs intact. No nystagmus. No neglect.   Neck:     Comments: Neck is supple and without rigidity.  Cardiovascular:     Rate and Rhythm: Normal rate and regular rhythm.     Pulses: Normal pulses.     Heart sounds: Normal heart sounds. No murmur heard.  No friction rub. No gallop.   Pulmonary:     Effort: Pulmonary effort is normal.     Breath sounds: Normal breath sounds.     Comments: Lungs clear to auscultation bilaterally.  Symmetric chest rise.  No wheezing, rales, rhonchi. Abdominal:     Palpations: Abdomen is soft. Abdomen is not rigid.     Tenderness: There is no abdominal tenderness. There is no guarding.     Comments: Abdomen is soft, non-distended, non-tender. No rigidity, No guarding. No peritoneal signs.  Musculoskeletal:        General: Normal range of motion.     Cervical back: Full passive range of motion without pain.  Skin:    General: Skin is warm and dry.     Capillary Refill: Capillary refill takes less than 2 seconds.     Comments: No rash  Neurological:     Mental Status: She is alert and oriented to person, place, and time.     Comments: Cranial nerves III-XII  intact Follows commands, Moves all extremities  5/5 strength to BUE and BLE  Sensation intact throughout all major nerve distributions No gait abnormalities  No slurred speech. No facial droop.   Psychiatric:        Speech: Speech normal.     ED Results / Procedures / Treatments   Labs (all labs ordered are listed, but only abnormal  results are displayed) Labs Reviewed  BASIC METABOLIC PANEL - Abnormal; Notable for the following components:      Result Value   Sodium 132 (*)    Chloride 97 (*)    Glucose, Bld 121 (*)    All other components within normal limits  SARS CORONAVIRUS 2 BY RT PCR (HOSPITAL ORDER, [facility])  CBC WITH DIFFERENTIAL/PLATELET  ROCKY MTN SPOTTED FVR ABS PNL(IGG+IGM)    EKG None  Radiology CT Head Wo Contrast  Result Date: 01/19/2020 CLINICAL DATA:  Headache EXAM: CT HEAD WITHOUT CONTRAST TECHNIQUE: Contiguous axial images were obtained from the base of the skull through the vertex without intravenous contrast. COMPARISON:  None. FINDINGS: Brain: No acute territorial infarction, hemorrhage, or intracranial mass. The ventricles are nonenlarged. Vascular: No hyperdense vessel or unexpected calcification. Skull: Normal. Negative for fracture or focal lesion. Sinuses/Orbits: Fluid level in the right maxillary sinus. Other: None IMPRESSION: 1. No CT evidence for acute intracranial abnormality. 2. Right maxillary sinusitis Electronically Signed   By: Donavan Foil M.D.   On: 01/19/2020 20:32   DG Chest Port 1 View  Result Date: 01/19/2020 CLINICAL DATA:  Cough, congestion and diarrhea x2 weeks. EXAM: PORTABLE CHEST 1 VIEW COMPARISON:  November 09, 2017 FINDINGS: There is no evidence of acute infiltrate, pleural effusion or pneumothorax. The heart size and mediastinal contours are within normal limits. The visualized skeletal structures are unremarkable. IMPRESSION: No active disease. Electronically Signed   By: Virgina Norfolk M.D.   On:  01/19/2020 18:33    Procedures Procedures (including critical care time)  Medications Ordered in ED Medications - No data to display  ED Course  I have reviewed the triage vital signs and the nursing notes.  Pertinent labs & imaging results that were available during my care of the patient were reviewed by me and considered in my medical decision making (see chart for details).    MDM Rules/Calculators/A&P                          69 year old female who presents for evaluation of 3 weeks of persistent cough, generalized fatigue, intermittent fever, headache.  She reports that she was in [place] when symptoms started.  She was seen in [place] had 2 - Covid test.  She was discharged on a course of antibiotics which she has completed.  She states that despite taking doxycycline, she continues to have cough, feeling fatigued.  She states that her normal temperature is 97 and at home, she has been running between 98-99.  She had one episode of 100.4 initial onset of symptoms.  None since.  On initially arrival, she is afebrile, nontoxic-appearing.  Vital signs are stable.  On exam, she has no evidence of rash.  No neuro deficits noted.  Consider viral infection versus COVID-19.  Also given that she has 3 weeks of persistent headache which is abnormal for her, will plan for CT head for any evaluation of acute space-occupying lesion.  History/physical exam not concerning for meningitis, CVA, dural venous thrombosis.  Also, consider [facility] spotted fever.  No history of tick bites and no rash noted on exam but given that she has had persistent symptoms, will plan for RMSF titers.  BMP shows normal BUN and creatinine.  Covid negative.  CBC shows no leukocytosis.  Hemoglobin stable.  Chest x-ray negative for any acute abnormality.  CT head shows right sided sinusitis. No other intracranial abnormalities.  I did discuss with patient.  She is still symptomatic with congestion.  We will plan for  another course of doxycycline.  Also encouraged use of Flonase.  Patient instructed that she has [facility] spotted fever titer sent. At this time, patient exhibits no emergent life-threatening condition that require further evaluation in ED or admission. Patient had ample opportunity for questions and discussion. All patient's questions were answered with full understanding. Strict return precautions discussed. Patient expresses understanding and agreement to plan.   Aleenah Winger was evaluated in Emergency Department on 01/19/2020 for the symptoms described in the history of present illness. She was evaluated in the context of the global COVID-19 pandemic, which necessitated consideration that the patient might be at risk for infection with the SARS-CoV-2 virus that causes COVID-19. Institutional protocols and algorithms that pertain to the evaluation of patients at risk for COVID-19 are in a state of rapid change based on information released by regulatory bodies including the CDC and federal and state organizations. These policies and algorithms were followed during the patient's care in the ED.  Portions of this note were generated with [employer]. Dictation errors may occur despite best attempts at proofreading   Final Clinical Impression(s) / ED Diagnoses Final diagnoses:  Cough  Acute sinusitis, recurrence not specified, unspecified location    Rx / DC Orders ED Discharge Orders         Ordered    doxycycline (VIBRA-TABS) 100 MG tablet  2 times daily,   Status:  Discontinued        01/19/20 2110    predniSONE (DELTASONE) 20 MG tablet  Daily,   Status:  Discontinued        01/19/20 2110    doxycycline (VIBRA-TABS) 100 MG tablet  2 times daily        01/19/20 2111    predniSONE (DELTASONE) 20 MG tablet  Daily        01/19/20 2111           Volanda Napoleon, PA-C 01/19/20 2325    Lucrezia Starch, MD 01/22/20 [phone]

## 2020-01-27 LAB — ROCKY MTN SPOTTED FVR ABS PNL(IGG+IGM)
RMSF IgG: NEGATIVE
RMSF IgM: 0.2 index (ref 0.00–0.89)

## 2020-06-01 ENCOUNTER — Encounter (HOSPITAL_BASED_OUTPATIENT_CLINIC_OR_DEPARTMENT_OTHER): Payer: Self-pay | Admitting: Emergency Medicine

## 2020-06-01 DIAGNOSIS — S0512XA Contusion of eyeball and orbital tissues, left eye, initial encounter: Secondary | ICD-10-CM | POA: Insufficient documentation

## 2020-06-01 DIAGNOSIS — Z853 Personal history of malignant neoplasm of breast: Secondary | ICD-10-CM | POA: Diagnosis not present

## 2020-06-01 DIAGNOSIS — Y92002 Bathroom of unspecified non-institutional (private) residence single-family (private) house as the place of occurrence of the external cause: Secondary | ICD-10-CM | POA: Diagnosis not present

## 2020-06-01 DIAGNOSIS — S0990XA Unspecified injury of head, initial encounter: Secondary | ICD-10-CM | POA: Insufficient documentation

## 2020-06-01 DIAGNOSIS — R42 Dizziness and giddiness: Secondary | ICD-10-CM | POA: Diagnosis not present

## 2020-06-01 DIAGNOSIS — R519 Headache, unspecified: Secondary | ICD-10-CM | POA: Diagnosis not present

## 2020-06-01 DIAGNOSIS — W19XXXA Unspecified fall, initial encounter: Secondary | ICD-10-CM | POA: Diagnosis not present

## 2020-06-01 NOTE — ED Triage Notes (Addendum)
Per spouse, he found pt on the floor of the bathroom. States she drank ETOH last night. Also c/o N/V. C/o pain to R side of head and bruising around L eye. Last alcohol 645a.

## 2020-06-02 ENCOUNTER — Emergency Department (HOSPITAL_BASED_OUTPATIENT_CLINIC_OR_DEPARTMENT_OTHER): Payer: Medicare Other

## 2020-06-02 ENCOUNTER — Other Ambulatory Visit: Payer: Self-pay

## 2020-06-02 ENCOUNTER — Emergency Department (HOSPITAL_BASED_OUTPATIENT_CLINIC_OR_DEPARTMENT_OTHER)
Admission: EM | Admit: 2020-06-02 | Discharge: 2020-06-02 | Disposition: A | Discharge status: Home / Self Care | Payer: Medicare Other | Attending: Emergency Medicine | Admitting: Emergency Medicine

## 2020-06-02 DIAGNOSIS — W19XXXA Unspecified fall, initial encounter: Secondary | ICD-10-CM

## 2020-06-02 DIAGNOSIS — S0512XA Contusion of eyeball and orbital tissues, left eye, initial encounter: Secondary | ICD-10-CM

## 2020-06-02 DIAGNOSIS — R519 Headache, unspecified: Secondary | ICD-10-CM | POA: Diagnosis not present

## 2020-06-02 DIAGNOSIS — S0990XA Unspecified injury of head, initial encounter: Secondary | ICD-10-CM

## 2020-06-02 DIAGNOSIS — R42 Dizziness and giddiness: Secondary | ICD-10-CM

## 2020-06-02 DIAGNOSIS — Y92009 Unspecified place in unspecified non-institutional (private) residence as the place of occurrence of the external cause: Secondary | ICD-10-CM

## 2020-06-02 LAB — BASIC METABOLIC PANEL
Anion gap: 13 (ref 5–15)
BUN: 16 mg/dL (ref 8–23)
CO2: 23 mmol/L (ref 22–32)
Calcium: 9.5 mg/dL (ref 8.9–10.3)
Chloride: 100 mmol/L (ref 98–111)
Creatinine, Ser: 0.67 mg/dL (ref 0.44–1.00)
GFR, Estimated: >60 mL/min (ref >60)
Glucose, Bld: 84 mg/dL (ref 70–99)
Potassium: 3.7 mmol/L (ref 3.5–5.1)
Sodium: 136 mmol/L (ref 135–145)

## 2020-06-02 MED ORDER — ONDANSETRON 8 MG PO TBDP: 8.0000 mg | ORAL_TABLET | Freq: Three times a day (TID) | ORAL | 0 refills | Status: DC | PRN

## 2020-06-02 MED ORDER — MECLIZINE HCL 25 MG PO TABS: 25.0000 mg | ORAL_TABLET | Freq: Three times a day (TID) | ORAL | 0 refills | Status: DC | PRN

## 2020-06-02 MED ORDER — ONDANSETRON HCL 4 MG/2ML IJ SOLN
4.0000 mg | Freq: Once | INTRAMUSCULAR | Status: AC
  Administered 2020-06-02: 4 mg via INTRAVENOUS
  Filled 2020-06-02: qty 2

## 2020-06-02 MED ORDER — SODIUM CHLORIDE 0.9 % IV BOLUS
1000.0000 mL | Freq: Once | INTRAVENOUS | Status: AC
  Administered 2020-06-02: 1000 mL via INTRAVENOUS

## 2020-06-02 MED ORDER — DIPHENHYDRAMINE HCL 50 MG/ML IJ SOLN
25.0000 mg | Freq: Once | INTRAMUSCULAR | Status: AC
  Administered 2020-06-02: 25 mg via INTRAVENOUS
  Filled 2020-06-02: qty 1

## 2020-06-02 NOTE — ED Provider Notes (Signed)
[facility] Provider Note: Mariah Dell, MD, FACEP  CSN: [id number] MRN: [id number] ARRIVAL: 06/01/20 at 1821 ROOM: [facility]13/[facility]   CHIEF COMPLAINT  Fall   HISTORY OF PRESENT ILLNESS  06/02/20 3:14 AM Mariah Romero is a 70 y.o. female who had "too much to drink" the night before last (New Year's Eve).  She fell and hit her head at least once but possibly more than once.  She has a left periorbital hematoma.  She has had nausea and vomiting as well as dizziness.  The dizziness is a sense of being off balance and the room spinning.  It is worse with standing or with movement of her head.  She rates the pain in her head and around her left eye is a 7 out of 10, worse with movement.  It is unknown if she lost consciousness as she was found on the floor by her husband.  Her last drink was about 6:45 AM yesterday.  She has not had anything to eat since.   Past Medical History:  Diagnosis Date  . Breast cancer ([facility]) 2001   S/P CHEMOTHERAPY  . Mitral valve regurgitation   . Osteoporosis     Past Surgical History:  Procedure Laterality Date  . APPENDECTOMY     AT AGE 45 YEARS OLD  . BREAST LUMPECTOMY Right 2001   AT AGE 5 YEARS OLD    Family History  Problem Relation Age of Onset  . Dementia Mother 63  . Diabetes Mother   . Hypertension Mother   . Melanoma Father   . Mental illness Father   . CVA Father   . Breast cancer Sister   . Emphysema Paternal Grandmother        never smoker  . Asthma Sister        had asthma as a child    Social History   Tobacco Use  . Smoking status: Never Smoker  . Smokeless tobacco: Never Used  Vaping Use  . Vaping Use: Never used  Substance Use Topics  . Alcohol use: Yes  . Drug use: No    Prior to Admission medications   Medication Sig Start Date End Date Taking? Authorizing Provider  meclizine (ANTIVERT) 25 MG tablet Take 1 tablet (25 mg total) by mouth 3 (three) times daily as needed for dizziness. 06/02/20  Yes Shahan Starks,  Jozi Malachi, MD  ondansetron (ZOFRAN ODT) 8 MG disintegrating tablet Take 1 tablet (8 mg total) by mouth every 8 (eight) hours as needed for nausea or vomiting. 06/02/20  Yes Mercia Dowe, MD  Cholecalciferol (VITAMIN D3) LIQD by Does not apply route.    [provider]  citalopram (CELEXA) 20 MG tablet Take 20 mg by mouth daily.    [provider]  Multiple Vitamins-Minerals (CENTRUM SILVER PO) Take 1 tablet by mouth daily.    [provider]  traZODone (DESYREL) 50 MG tablet Take 50 mg by mouth at bedtime.     [provider]  vitamin B-12 (CYANOCOBALAMIN) 100 MCG tablet Take 1,000 mcg by mouth daily.     [provider]    Allergies Codeine, Oxycodone, Ibuprofen, and Penicillins   REVIEW OF SYSTEMS  Negative except as noted here or in the History of Present Illness.   PHYSICAL EXAMINATION  Initial Vital Signs Blood pressure (!) 154/62, pulse (!) 101, temperature 98.9 F (37.2 C), temperature source Oral, resp. rate 18, height 5\' 4"  (1.626 m), weight 54.4 kg, SpO2 99 %.  Examination General: Well-developed, well-nourished  female in no acute distress; appearance consistent with age of record HENT: normocephalic; no hemotympanum; left periorbital hematoma:    Eyes: pupils equal, round and reactive to light; extraocular muscles intact; no nystagmus Neck: supple; nontender Heart: regular rate and rhythm Lungs: clear to auscultation bilaterally Abdomen: soft; nondistended; nontender; bowel sounds present Extremities: No deformity; full range of motion; pulses normal Neurologic: Awake, alert and oriented; motor function intact in all extremities and symmetric; no facial droop Skin: Warm and dry Psychiatric: Normal mood and affect   RESULTS  Summary of this visit's results, reviewed and interpreted by myself:   EKG Interpretation  Date/Time:    Ventricular Rate:    PR Interval:    QRS Duration:   QT Interval:    QTC Calculation:   R  Axis:     Text Interpretation:        Laboratory Studies: Results for orders placed or performed during the hospital encounter of 06/02/20 (from the past 24 hour(s))  Basic metabolic panel     Status: None   Collection Time: 06/02/20  3:37 AM  Result Value Ref Range   Sodium 136 135 - 145 mmol/L   Potassium 3.7 3.5 - 5.1 mmol/L   Chloride 100 98 - 111 mmol/L   CO2 23 22 - 32 mmol/L   Glucose, Bld 84 70 - 99 mg/dL   BUN 16 8 - 23 mg/dL   Creatinine, Ser 9.14 0.44 - 1.00 mg/dL   Calcium 9.5 8.9 - 78.2 mg/dL   GFR, Estimated >95 >62 mL/min   Anion gap 13 5 - 15   Imaging Studies: CT Head Wo Contrast  Result Date: 06/02/2020 CLINICAL DATA:  Found down. Right-sided head pain. Left periorbital bruising. EXAM: CT HEAD WITHOUT CONTRAST TECHNIQUE: Contiguous axial images were obtained from the base of the skull through the vertex without intravenous contrast. COMPARISON:  01/19/2020 FINDINGS: Brain: There is no mass, hemorrhage or extra-axial collection. The size and configuration of the ventricles and extra-axial CSF spaces are normal. The brain parenchyma is normal, without acute or chronic infarction. Vascular: No abnormal hyperdensity of the major intracranial arteries or dural venous sinuses. No intracranial atherosclerosis. Skull: The visualized skull base, calvarium and extracranial soft tissues are normal. Sinuses/Orbits: No fluid levels or advanced mucosal thickening of the visualized paranasal sinuses. No mastoid or middle ear effusion. The orbits are normal. IMPRESSION: Normal head CT. Electronically Signed   By: Deatra Robinson M.D.   On: 06/02/2020 03:49    ED COURSE and MDM  Nursing notes, initial and subsequent vitals signs, including pulse oximetry, reviewed and interpreted by myself.  Vitals:   06/01/20 1831 06/01/20 2150 06/02/20 0058 06/02/20 0333  BP: (!) 144/65 (!) 147/68 (!) 154/62 (!) 148/60  Pulse: 88 (!) 112 (!) 101 (!) 104  Resp: 18 20 18 16   Temp: 98.4 F (36.9 C)  98.5 F (36.9 C) 98.9 F (37.2 C)   TempSrc: Oral Oral Oral   SpO2: 100% 99% 99% 100%  Weight:      Height:       Medications  sodium chloride 0.9 % bolus 1,000 mL (1,000 mLs Intravenous New Bag/Given 06/02/20 0331)  ondansetron (ZOFRAN) injection 4 mg (4 mg Intravenous Given 06/02/20 0331)  diphenhydrAMINE (BENADRYL) injection 25 mg (25 mg Intravenous Given 06/02/20 0331)   4:12 AM The patient is likely dehydrated from overindulgence and subsequent poor oral intake.  No evidence of intracranial injury.   PROCEDURES  Procedures   ED DIAGNOSES  ICD-10-CM   1. Fall in home, initial encounter  W19.XXXA    Y92.009   2. Minor head injury, initial encounter  S09.90XA   3. Periorbital contusion of left eye, initial encounter  S05.12XA   4. Vertigo  R42        Mandalyn Pasqua, Jonny Ruiz, MD 06/02/20 [phone]

## 2020-06-02 NOTE — ED Notes (Signed)
Patient transported to CT 

## 2020-06-02 NOTE — ED Notes (Signed)
Discharge instructions and medications discussed with patient. Verbalized understanding. Departs ED at this time in wheelchair in stable condition.

## 2020-06-03 ENCOUNTER — Ambulatory Visit (INDEPENDENT_AMBULATORY_CARE_PROVIDER_SITE_OTHER): Payer: Medicare Other

## 2020-06-03 ENCOUNTER — Other Ambulatory Visit: Payer: Self-pay | Admitting: Family Medicine

## 2020-06-03 DIAGNOSIS — G319 Degenerative disease of nervous system, unspecified: Secondary | ICD-10-CM | POA: Diagnosis not present

## 2020-06-03 DIAGNOSIS — W19XXXA Unspecified fall, initial encounter: Secondary | ICD-10-CM

## 2020-06-03 DIAGNOSIS — S0512XA Contusion of eyeball and orbital tissues, left eye, initial encounter: Secondary | ICD-10-CM | POA: Diagnosis not present

## 2020-06-03 DIAGNOSIS — R110 Nausea: Secondary | ICD-10-CM | POA: Diagnosis not present

## 2020-06-03 DIAGNOSIS — S0990XA Unspecified injury of head, initial encounter: Secondary | ICD-10-CM | POA: Diagnosis not present

## 2020-06-03 DIAGNOSIS — R519 Headache, unspecified: Secondary | ICD-10-CM | POA: Diagnosis not present

## 2020-06-24 DIAGNOSIS — D225 Melanocytic nevi of trunk: Secondary | ICD-10-CM | POA: Diagnosis not present

## 2020-06-24 DIAGNOSIS — L814 Other melanin hyperpigmentation: Secondary | ICD-10-CM | POA: Diagnosis not present

## 2020-06-24 DIAGNOSIS — D1801 Hemangioma of skin and subcutaneous tissue: Secondary | ICD-10-CM | POA: Diagnosis not present

## 2020-06-24 DIAGNOSIS — L820 Inflamed seborrheic keratosis: Secondary | ICD-10-CM | POA: Diagnosis not present

## 2020-06-24 DIAGNOSIS — L738 Other specified follicular disorders: Secondary | ICD-10-CM | POA: Diagnosis not present

## 2020-06-24 DIAGNOSIS — L821 Other seborrheic keratosis: Secondary | ICD-10-CM | POA: Diagnosis not present

## 2020-06-24 DIAGNOSIS — L918 Other hypertrophic disorders of the skin: Secondary | ICD-10-CM | POA: Diagnosis not present

## 2020-07-04 DIAGNOSIS — R197 Diarrhea, unspecified: Secondary | ICD-10-CM | POA: Diagnosis not present

## 2020-07-04 DIAGNOSIS — R1011 Right upper quadrant pain: Secondary | ICD-10-CM | POA: Diagnosis not present

## 2020-07-26 DIAGNOSIS — C50919 Malignant neoplasm of unspecified site of unspecified female breast: Secondary | ICD-10-CM | POA: Diagnosis not present

## 2020-07-26 DIAGNOSIS — M810 Age-related osteoporosis without current pathological fracture: Secondary | ICD-10-CM | POA: Diagnosis not present

## 2020-07-26 DIAGNOSIS — I10 Essential (primary) hypertension: Secondary | ICD-10-CM | POA: Diagnosis not present

## 2020-08-13 DIAGNOSIS — Z01812 Encounter for preprocedural laboratory examination: Secondary | ICD-10-CM | POA: Diagnosis not present

## 2020-08-16 DIAGNOSIS — D122 Benign neoplasm of ascending colon: Secondary | ICD-10-CM | POA: Diagnosis not present

## 2020-08-16 DIAGNOSIS — K635 Polyp of colon: Secondary | ICD-10-CM | POA: Diagnosis not present

## 2020-08-16 DIAGNOSIS — K5730 Diverticulosis of large intestine without perforation or abscess without bleeding: Secondary | ICD-10-CM | POA: Diagnosis not present

## 2020-08-16 DIAGNOSIS — K648 Other hemorrhoids: Secondary | ICD-10-CM | POA: Diagnosis not present

## 2020-08-16 DIAGNOSIS — Z1211 Encounter for screening for malignant neoplasm of colon: Secondary | ICD-10-CM | POA: Diagnosis not present

## 2020-08-20 DIAGNOSIS — K635 Polyp of colon: Secondary | ICD-10-CM | POA: Diagnosis not present

## 2020-08-20 DIAGNOSIS — D122 Benign neoplasm of ascending colon: Secondary | ICD-10-CM | POA: Diagnosis not present

## 2020-08-28 ENCOUNTER — Telehealth: Payer: Self-pay | Admitting: Interventional Cardiology

## 2020-08-28 NOTE — Telephone Encounter (Signed)
I spoke with patient.  She reports after eating breakfast she will have a major slump and feel exhausted.  Does not happen everyday.  Feels better after about an hour.  No chest pain or shortness of breath.  Not diabetic.  Has not been checking her BP.  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  I asked patient to contact PCP regarding this.  Patient would like to schedule appointment with Dr Irish Lack.  Appointment scheduled for May 17,2022 at 1:20

## 2020-08-28 NOTE — Telephone Encounter (Signed)
Pt c/o BP issue: STAT if pt c/o blurred vision, one-sided weakness or slurred speech  1. What are your last 5 BP readings? Patient do not have BP numbers  2. Are you having any other symptoms (ex. Dizziness, headache, blurred vision, passed out)? Headaches lately   3. What is your BP issue? When patient is sitting down sometimes it shoots up the patient feels very tired. Patient also wanted a a appt.

## 2020-09-16 DIAGNOSIS — R519 Headache, unspecified: Secondary | ICD-10-CM | POA: Diagnosis not present

## 2020-09-16 DIAGNOSIS — S060X0S Concussion without loss of consciousness, sequela: Secondary | ICD-10-CM | POA: Diagnosis not present

## 2020-09-16 DIAGNOSIS — R195 Other fecal abnormalities: Secondary | ICD-10-CM | POA: Diagnosis not present

## 2020-09-16 DIAGNOSIS — Z131 Encounter for screening for diabetes mellitus: Secondary | ICD-10-CM | POA: Diagnosis not present

## 2020-09-26 DIAGNOSIS — I10 Essential (primary) hypertension: Secondary | ICD-10-CM | POA: Diagnosis not present

## 2020-09-26 DIAGNOSIS — C50919 Malignant neoplasm of unspecified site of unspecified female breast: Secondary | ICD-10-CM | POA: Diagnosis not present

## 2020-09-26 DIAGNOSIS — M810 Age-related osteoporosis without current pathological fracture: Secondary | ICD-10-CM | POA: Diagnosis not present

## 2020-10-15 ENCOUNTER — Ambulatory Visit: Payer: Medicare Other | Admitting: Interventional Cardiology

## 2020-11-20 ENCOUNTER — Ambulatory Visit: Payer: Medicare Other | Admitting: Diagnostic Neuroimaging

## 2020-11-29 DIAGNOSIS — U071 COVID-19: Secondary | ICD-10-CM

## 2020-11-29 HISTORY — DX: COVID-19: U07.1

## 2020-12-03 NOTE — Progress Notes (Signed)
Cardiology Office Note   Date:  12/04/2020   ID:  Mariah Romero, DOB 1951-01-13, MRN [id number]  PCP:  Vernie Shanks, MD    No chief complaint on file.  palpitations  Wt Readings from Last 3 Encounters:  12/04/20 127 lb 3.2 oz (57.7 kg)  06/01/20 120 lb (54.4 kg)  01/19/20 117 lb (53.1 kg)       History of Present Illness: Mariah Romero is a 70 y.o. female   Who has had palpitations.   She was living in [place].  She was seen years ago for palpitations and she had a negative w/u with a monitor.  She was told she had MR as well.  Her last echo was several years ago.  She was supposed to have one in [place]. But the move to [place] happened before she left.  She was told that she was more sensitive to skipped beats than most.   THe patient has had low BP for many years.  Regardless, the patient was started on atenolol for palpitations.  She was told that she could take extra atenolol if beeded for palpitations.     She had more palpitations and saw me in April 2018.  We stopped her beta blocker.    After that, her energy improved, but she felt more skipped beats.  They typically occurred at night.  It took about a month to adjust to being off atenolol.  She was happy to be off of this.  She tried to cut down on the trazodone.    Her mother with Alzheimers moved in to her home.  This has been a source of stress and she has had increased palpitations.  She has a window to see if palpitations decrease with her mother visiting her sister.  She will likely be having an MRI so wants to wait on the monitor until after that MRI.    Denies : Chest pain. Dizziness. Leg edema. Nitroglycerin use. Orthopnea. Paroxysmal nocturnal dyspnea. Shortness of breath. Syncope.      Past Medical History:  Diagnosis Date   Breast cancer ([facility]) 2001   S/P CHEMOTHERAPY   Mitral valve regurgitation    Osteoporosis     Past Surgical History:  Procedure Laterality Date   APPENDECTOMY     AT AGE 43 YEARS  OLD   BREAST LUMPECTOMY Right 2001   AT AGE 19 YEARS OLD     Current Outpatient Medications  Medication Sig Dispense Refill   Biotin 10 MG CAPS Take by mouth.     Cholecalciferol (VITAMIN D3) LIQD by Does not apply route.     citalopram (CELEXA) 20 MG tablet Take 20 mg by mouth daily.     meclizine (ANTIVERT) 25 MG tablet Take 1 tablet (25 mg total) by mouth 3 (three) times daily as needed for dizziness. 30 tablet 0   Multiple Vitamins-Minerals (CENTRUM SILVER PO) Take 1 tablet by mouth daily.     traZODone (DESYREL) 50 MG tablet Take 50 mg by mouth at bedtime.      vitamin B-12 (CYANOCOBALAMIN) 100 MCG tablet Take 1,000 mcg by mouth daily.      No current facility-administered medications for this visit.    Allergies:   Codeine, Oxycodone, Ibuprofen, and Penicillins    Social History:  The patient  reports that she has never smoked. She has never used smokeless tobacco. She reports current alcohol use. She reports that she does not use drugs.   Family History:  The patient's  family history includes Asthma in her sister; Breast cancer in her sister; CVA in her father; Dementia (age of onset: 3) in her mother; Diabetes in her mother; Emphysema in her paternal grandmother; Hypertension in her mother; Melanoma in her father; Mental illness in her father.    ROS:  Please see the history of present illness.   Otherwise, review of systems are positive for stress in her life.   All other systems are reviewed and negative.    PHYSICAL EXAM: VS:  BP 104/60   Pulse 69   Ht 5\' 4"  (1.626 m)   Wt 127 lb 3.2 oz (57.7 kg)   SpO2 96%   BMI 21.83 kg/m  , BMI Body mass index is 21.83 kg/m. GEN: Well nourished, well developed, in no acute distress HEENT: normal Neck: no JVD, carotid bruits, or masses Cardiac: RRR; no murmurs, rubs, or gallops,no edema  Respiratory:  clear to auscultation bilaterally, normal work of breathing GI: soft, nontender, nondistended, + BS MS: no deformity or  atrophy Skin: warm and dry, no rash Neuro:  Strength and sensation are intact Psych: euthymic mood, full affect   EKG:   The ekg ordered today demonstrates NSR, RBBB   Recent Labs: 01/19/2020: Hemoglobin 13.0; Platelets 267 06/02/2020: BUN 16; Creatinine, Ser 0.67; Potassium 3.7; Sodium 136   Lipid Panel No results found for: CHOL, TRIG, HDL, CHOLHDL, VLDL, LDLCALC, LDLDIRECT   Other studies Reviewed: Additional studies/ records that were reviewed today with results demonstrating: labs reviewed.   ASSESSMENT AND PLAN:  Mitral regurgitation: no CHF.  Appears euvolemic. Palpitations: PACs. PLan for monitor when she is ready.  She will wait until after her MRI.  If her symptoms resolve with decreased strength at her house, she may decide to hold off.  Regardless, no syncope.  The symptoms do not sound life-threatening.  She had a negative monitor on 02/09/2017. DOE/fatigue: Increase activity.  Activity limited when her Mom moved.    Current medicines are reviewed at length with the patient today.  The patient concerns regarding her medicines were addressed.  The following changes have been made:  No change  Labs/ tests ordered today include:  No orders of the defined types were placed in this encounter.   Recommend 150 minutes/week of aerobic exercise Low fat, low carb, high fiber diet recommended  Disposition:   FU in 1 year   Signed, Larae Grooms, MD  12/04/2020 2:27 PM    [facility] [address], [place], [place]  [address] Phone: [phone]; Fax: [phone]

## 2020-12-04 ENCOUNTER — Other Ambulatory Visit: Payer: Self-pay

## 2020-12-04 ENCOUNTER — Ambulatory Visit: Payer: Medicare Other | Admitting: Interventional Cardiology

## 2020-12-04 ENCOUNTER — Encounter: Payer: Self-pay | Admitting: Interventional Cardiology

## 2020-12-04 VITALS — BP 104/60 | HR 69 | Ht 64.0 in | Wt 127.2 lb

## 2020-12-04 DIAGNOSIS — I340 Nonrheumatic mitral (valve) insufficiency: Secondary | ICD-10-CM | POA: Diagnosis not present

## 2020-12-04 DIAGNOSIS — R002 Palpitations: Secondary | ICD-10-CM | POA: Diagnosis not present

## 2020-12-04 DIAGNOSIS — R5383 Other fatigue: Secondary | ICD-10-CM

## 2020-12-04 NOTE — Patient Instructions (Signed)
Medication Instructions:  Your physician recommends that you continue on your current medications as directed. Please refer to the Current Medication list given to you today.  *If you need a refill on your cardiac medications before your next appointment, please call your pharmacy*   Lab Work: none If you have labs (blood work) drawn today and your tests are completely normal, you will receive your results only by: [place] (if you have MyChart) OR A paper copy in the mail If you have any lab test that is abnormal or we need to change your treatment, we will call you to review the results.   Testing/Procedures: none   Follow-Up: At [facility], you and your health needs are our priority.  As part of our continuing mission to provide you with exceptional heart care, we have created designated Provider Care Teams.  These Care Teams include your primary Cardiologist (physician) and Advanced Practice Providers (APPs -  Physician Assistants and Nurse Practitioners) who all work together to provide you with the care you need, when you need it.  We recommend signing up for the patient portal called "MyChart".  Sign up information is provided on this After Visit Summary.  MyChart is used to connect with patients for Virtual Visits (Telemedicine).  Patients are able to view lab/test results, encounter notes, upcoming appointments, etc.  Non-urgent messages can be sent to your provider as well.   To learn more about what you can do with MyChart, go to [email or web address].    Your next appointment:   12 month(s)  The format for your next appointment:   In Person  Provider:   You may see Larae Grooms, MD or one of the following Advanced Practice Providers on your designated Care Team:   Melina Copa, PA-C Ermalinda Barrios, PA-C   Other Instructions Call us if you would like to wear a monitor

## 2020-12-05 ENCOUNTER — Encounter: Payer: Self-pay | Admitting: Neurology

## 2020-12-05 ENCOUNTER — Ambulatory Visit: Payer: Medicare Other | Admitting: Neurology

## 2020-12-05 VITALS — BP 118/67 | HR 73 | Ht 64.0 in | Wt 127.0 lb

## 2020-12-05 DIAGNOSIS — M542 Cervicalgia: Secondary | ICD-10-CM | POA: Insufficient documentation

## 2020-12-05 DIAGNOSIS — R519 Headache, unspecified: Secondary | ICD-10-CM | POA: Insufficient documentation

## 2020-12-05 MED ORDER — SUMATRIPTAN SUCCINATE 25 MG PO TABS: 25.0000 mg | ORAL_TABLET | ORAL | 6 refills | Status: DC | PRN

## 2020-12-05 NOTE — Progress Notes (Signed)
Chief Complaint  Patient presents with   New Patient (Initial Visit)    Room 12 alone. Referred for chronic, daily headaches. This started after falling in January 2022 and hitting her head (tripped on rug in bathroom). Also, history of breast cancer. She completed chemotherapy in September 2001.       ASSESSMENT AND PLAN  Mariah Romero is a 70 y.o. female   Headache, right-sided neck pain following fall in January 2022  Normal examination except significant tenderness around right nuchal line  CT scan of the brain showed no acute abnormality, evidence of mild bifrontal atrophy  Strong family history of dementia, also migraine headaches,  Headache has a lot of migraine features, I have suggested Imitrex 25 mg as needed, may mix together with NSAIDs or Tylenol  Also performed trigger point injection, see separate note  Only return to clinic for worsening symptoms  DIAGNOSTIC DATA (LABS, IMAGING, TESTING) - I reviewed patient records, labs, notes, testing and imaging myself where available.   HISTORICAL  Mariah Romero, is a 70 year old female, seen in request by her primary care physician Dr. Jacelyn Grip, [facility], for evaluation of frequent headaches, right occipital area tenderness, initial evaluation was on December 05, 2020.   I reviewed and summarized the referring note. PMHx  Depression, celexa 20mg , trazodone 50mg  qhs. Right breast cancer 2001, surgery, chemo, radiation therapy.  She reported extreme stress over the past many years, often felt tension in her shoulder, occasionally headaches.  She has strong family history of migraine headaches, her father, sister has migraines,  Since young, she has occasionally headache, few times each year around menstruation, but due to extreme stress  She complains of increased headaches since she fell in January 2022, she is tripped on a bathroom rug, fell forward, then bounced backwards, with transient loss of consciousness, develop  significant headache holoacranial, occipital area pain next day, was seen by primary care physician, had repeat CAT scan on January 2, 3, 2022, personally reviewed the film, no acute abnormality, mild atrophy involving bilateral frontal region  Over the past few months, she manages her headache by taking Tylenol only very clearly, but she has headache 3-4 times each week, during intense headache she complains of light noise sensitivity, her headache would improve by lying down in a dark quiet room for few hours,   She also suffered a severe bouts of headache in August 2021 following her upper respiratory infection, bronchitis, significant cough, had a CT head in August 2021 that was essentially normal  She fell again few days ago slipped and fell landed on her right shoulder, since then she has worsening right nuchal area pain, but denies radiating pain to bilateral upper extremity, denies gait abnormality.   PHYSICAL EXAM:   Vitals:   12/05/20 1515  Weight: 127 lb (57.6 kg)  Height: 5\' 4"  (1.626 m)   Not recorded     Body mass index is 21.8 kg/m.  PHYSICAL EXAMNIATION:  Gen: NAD, conversant, well nourised, well groomed                     Cardiovascular: Regular rate rhythm, no peripheral edema, warm, nontender. Eyes: Conjunctivae clear without exudates or hemorrhage Neck: Supple, no carotid bruits. Pulmonary: Clear to auscultation bilaterally   NEUROLOGICAL EXAM:  MENTAL STATUS: Speech:    Speech is normal; fluent and spontaneous with normal comprehension.  Cognition:     Orientation to time, place and person     Normal recent and  remote memory     Normal Attention span and concentration     Normal Language, naming, repeating,spontaneous speech     Fund of knowledge   CRANIAL NERVES: CN II: Visual fields are full to confrontation. Pupils are round equal and briskly reactive to light. CN III, IV, VI: extraocular movement are normal. No ptosis. CN V: Facial sensation is  intact to light touch CN VII: Face is symmetric with normal eye closure  CN VIII: Hearing is normal to causal conversation. CN IX, X: Phonation is normal. CN XI: Head turning and shoulder shrug are intact  MOTOR: There is no pronator drift of out-stretched arms. Muscle bulk and tone are normal. Muscle strength is normal.  REFLEXES: Reflexes are 2+ and symmetric at the biceps, triceps, knees, and ankles. Plantar responses are flexor.  SENSORY: Intact to light touch, pinprick and vibratory sensation are intact in fingers and toes.  COORDINATION: There is no trunk or limb dysmetria noted.  GAIT/STANCE: Posture is normal. Gait is steady with normal steps, base, arm swing, and turning. Heel and toe walking are normal. Tandem gait is normal.  Romberg is absent.  REVIEW OF SYSTEMS:  Full 14 system review of systems performed and notable only for as above All other review of systems were negative.   ALLERGIES: Allergies  Allergen Reactions   Codeine     RINGING IN THE EARS.    Oxycodone Itching   Ibuprofen Rash   Penicillins Swelling and Rash    HOME MEDICATIONS: Current Outpatient Medications  Medication Sig Dispense Refill   Biotin 10 MG CAPS Take by mouth.     Cholecalciferol (VITAMIN D3) LIQD by Does not apply route.     citalopram (CELEXA) 20 MG tablet Take 20 mg by mouth daily.     Multiple Vitamins-Minerals (CENTRUM SILVER PO) Take 1 tablet by mouth daily.     traZODone (DESYREL) 50 MG tablet Take 50 mg by mouth at bedtime.      vitamin B-12 (CYANOCOBALAMIN) 100 MCG tablet Take 1,000 mcg by mouth daily.      No current facility-administered medications for this visit.    PAST MEDICAL HISTORY: Past Medical History:  Diagnosis Date   Anxiety and depression    Breast cancer ([facility] [facility]) 2001   RIGHT SIDE. S/P CHEMOTHERAPY   Headache    Mitral valve regurgitation    Osteoporosis     PAST SURGICAL HISTORY: Past Surgical History:  Procedure Laterality Date    APPENDECTOMY     AT AGE 68 YEARS OLD   BREAST LUMPECTOMY Right 2001   AT AGE 50 YEARS OLD   CHOLECYSTECTOMY      FAMILY HISTORY: Family History  Problem Relation Age of Onset   Dementia Mother 13   Diabetes Mother    Hypertension Mother    Melanoma Father    Mental illness Father    CVA Father    Breast cancer Sister    Emphysema Paternal Grandmother        never smoker   Asthma Sister        had asthma as a child    SOCIAL HISTORY: Social History   Socioeconomic History   Marital status: Widowed    Spouse name: Not on file   Number of children: 2   Years of education: COLLEGE   Highest education level: Not on file  Occupational History   Occupation: RETIRED  Tobacco Use   Smoking status: Never   Smokeless tobacco: Never  Vaping Use  Vaping Use: Never used  Substance and Sexual Activity   Alcohol use: Yes    Comment: 2 drinks per day   Drug use: No   Sexual activity: Not on file  Other Topics Concern   Not on file  Social History Narrative   Lives with significant other and mother. 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   Right-handed.   2-3 cups caffeine per day.   Social Determinants of Health   Financial Resource Strain: Not on file  Food Insecurity: Not on file  Transportation Needs: Not on file  Physical Activity: Not on file  Stress: Not on file  Social Connections: Not on file  Intimate Partner Violence: Not on file      Marcial Pacas, M.D. Ph.D.  [facility] [address] [place], [place] [address] Ph: [phone] Fax: [phone]  CC:  Vernie Shanks, MD [place],  [place] [address]  Vernie Shanks, MD

## 2020-12-05 NOTE — Progress Notes (Signed)
   History: Frequent right-sided headaches since fall injury in January 2022, tenderness along right nuchal line  Trigger point injection along right nuchal line, right levator scapular, right upper trapezius  1.5 cc of bupivacaine 0.5% mixed with 1.5 cc betamethasone (6 mg/ML), used total of 3 cc, multiple injection site, Patient tolerated injection well

## 2020-12-05 NOTE — Patient Instructions (Signed)
Meds ordered this encounter  Medications   SUMAtriptan (IMITREX) 25 MG tablet    Sig: Take 1 tablet (25 mg total) by mouth every 2 (two) hours as needed for migraine. May repeat in 2 hours if headache persists or recurs.    Dispense:  10 tablet    Refill:  6

## 2020-12-09 DIAGNOSIS — R5383 Other fatigue: Secondary | ICD-10-CM | POA: Diagnosis not present

## 2020-12-09 DIAGNOSIS — R059 Cough, unspecified: Secondary | ICD-10-CM | POA: Diagnosis not present

## 2020-12-09 DIAGNOSIS — U071 COVID-19: Secondary | ICD-10-CM | POA: Diagnosis not present

## 2020-12-09 DIAGNOSIS — R509 Fever, unspecified: Secondary | ICD-10-CM | POA: Diagnosis not present

## 2020-12-11 ENCOUNTER — Telehealth: Payer: Self-pay | Admitting: Neurology

## 2020-12-11 NOTE — Telephone Encounter (Signed)
Agree to be evaluated by PCP first

## 2020-12-11 NOTE — Telephone Encounter (Signed)
I returned the call to the patient. She has new onset of low back and groin pain that started on 12/09/20. She tested positive for Covid-19 on 12/08/20. She is also reporting a fall a little less than one week ago (tripped over her sandal and fell in garage). She is unsure what caused the discomfort. She is going to contact her PCP today for further evaluation.

## 2020-12-11 NOTE — Telephone Encounter (Signed)
Pt is asking for a call to discuss sever back pain that she has had for 2 days now.  Pt also has Covid-19.  Pt unsure if her back pain stems from Covid-19 or a fall that she had a few weeks ago.  Please call pt to discuss.

## 2020-12-13 DIAGNOSIS — M25551 Pain in right hip: Secondary | ICD-10-CM | POA: Diagnosis not present

## 2020-12-16 ENCOUNTER — Other Ambulatory Visit: Payer: Self-pay | Admitting: Family Medicine

## 2020-12-16 ENCOUNTER — Ambulatory Visit
Admission: RE | Admit: 2020-12-16 | Discharge: 2020-12-16 | Disposition: A | Discharge status: Home / Self Care | Payer: Medicare Other | Source: Ambulatory Visit | Attending: Family Medicine | Admitting: Family Medicine

## 2020-12-16 DIAGNOSIS — M1612 Unilateral primary osteoarthritis, left hip: Secondary | ICD-10-CM | POA: Diagnosis not present

## 2020-12-16 DIAGNOSIS — M25552 Pain in left hip: Secondary | ICD-10-CM

## 2020-12-27 ENCOUNTER — Other Ambulatory Visit: Payer: Self-pay | Admitting: Family Medicine

## 2020-12-27 DIAGNOSIS — U071 COVID-19: Secondary | ICD-10-CM | POA: Diagnosis not present

## 2020-12-27 DIAGNOSIS — M810 Age-related osteoporosis without current pathological fracture: Secondary | ICD-10-CM | POA: Diagnosis not present

## 2020-12-27 DIAGNOSIS — Z1231 Encounter for screening mammogram for malignant neoplasm of breast: Secondary | ICD-10-CM

## 2020-12-27 DIAGNOSIS — I10 Essential (primary) hypertension: Secondary | ICD-10-CM | POA: Diagnosis not present

## 2020-12-27 DIAGNOSIS — C50919 Malignant neoplasm of unspecified site of unspecified female breast: Secondary | ICD-10-CM | POA: Diagnosis not present

## 2021-01-02 DIAGNOSIS — Z6822 Body mass index (BMI) 22.0-22.9, adult: Secondary | ICD-10-CM | POA: Diagnosis not present

## 2021-01-02 DIAGNOSIS — M7918 Myalgia, other site: Secondary | ICD-10-CM | POA: Diagnosis not present

## 2021-01-02 DIAGNOSIS — Z8616 Personal history of COVID-19: Secondary | ICD-10-CM | POA: Diagnosis not present

## 2021-01-02 DIAGNOSIS — M545 Low back pain, unspecified: Secondary | ICD-10-CM | POA: Diagnosis not present

## 2021-01-02 DIAGNOSIS — R5383 Other fatigue: Secondary | ICD-10-CM | POA: Diagnosis not present

## 2021-01-02 DIAGNOSIS — E559 Vitamin D deficiency, unspecified: Secondary | ICD-10-CM | POA: Diagnosis not present

## 2021-01-02 DIAGNOSIS — E538 Deficiency of other specified B group vitamins: Secondary | ICD-10-CM | POA: Diagnosis not present

## 2021-01-03 ENCOUNTER — Ambulatory Visit
Admission: RE | Admit: 2021-01-03 | Discharge: 2021-01-03 | Disposition: A | Discharge status: Home / Self Care | Payer: Medicare Other | Source: Ambulatory Visit | Attending: Family Medicine | Admitting: Family Medicine

## 2021-01-03 ENCOUNTER — Other Ambulatory Visit: Payer: Self-pay

## 2021-01-03 DIAGNOSIS — Z1231 Encounter for screening mammogram for malignant neoplasm of breast: Secondary | ICD-10-CM | POA: Diagnosis not present

## 2021-03-12 DIAGNOSIS — M810 Age-related osteoporosis without current pathological fracture: Secondary | ICD-10-CM | POA: Diagnosis not present

## 2021-03-12 DIAGNOSIS — C50919 Malignant neoplasm of unspecified site of unspecified female breast: Secondary | ICD-10-CM | POA: Diagnosis not present

## 2021-03-12 DIAGNOSIS — I10 Essential (primary) hypertension: Secondary | ICD-10-CM | POA: Diagnosis not present

## 2021-03-25 ENCOUNTER — Telehealth: Payer: Self-pay | Admitting: Neurology

## 2021-03-25 NOTE — Telephone Encounter (Signed)
Pt called, would like a call from the nurse to discuss a sooner appt. Having headaches four out of five days. Would like a call from the nurse to discuss may be getting worked in with Dr. Krista Blue. Do not want to see an NP.

## 2021-03-26 NOTE — Telephone Encounter (Signed)
Called pt. She accepted work in appt for 03/27/21 at [facility] w/ Dr. Krista Blue. She will check in at 8/815am.

## 2021-03-26 NOTE — Telephone Encounter (Signed)
Attempted to call pt, Mariah Romero. No available appts at this time, will look out for cancellations

## 2021-03-27 ENCOUNTER — Encounter: Payer: Self-pay | Admitting: Neurology

## 2021-03-27 ENCOUNTER — Ambulatory Visit: Payer: Medicare Other | Admitting: Neurology

## 2021-03-27 VITALS — BP 121/68 | HR 67 | Ht 64.0 in | Wt 128.0 lb

## 2021-03-27 DIAGNOSIS — Z853 Personal history of malignant neoplasm of breast: Secondary | ICD-10-CM | POA: Diagnosis not present

## 2021-03-27 DIAGNOSIS — R760 Raised antibody titer: Secondary | ICD-10-CM | POA: Diagnosis not present

## 2021-03-27 DIAGNOSIS — R7989 Other specified abnormal findings of blood chemistry: Secondary | ICD-10-CM | POA: Diagnosis not present

## 2021-03-27 DIAGNOSIS — R519 Headache, unspecified: Secondary | ICD-10-CM

## 2021-03-27 DIAGNOSIS — R700 Elevated erythrocyte sedimentation rate: Secondary | ICD-10-CM | POA: Diagnosis not present

## 2021-03-27 DIAGNOSIS — R799 Abnormal finding of blood chemistry, unspecified: Secondary | ICD-10-CM | POA: Diagnosis not present

## 2021-03-27 DIAGNOSIS — R748 Abnormal levels of other serum enzymes: Secondary | ICD-10-CM | POA: Diagnosis not present

## 2021-03-27 MED ORDER — METHOCARBAMOL 500 MG PO TABS: 500.0000 mg | ORAL_TABLET | Freq: Every day | ORAL | 3 refills | Status: DC | PRN

## 2021-03-27 NOTE — Progress Notes (Signed)
Chief Complaint  Patient presents with   Follow-up    New rm, alone, states headaches have increased, would like to discuss medication       ASSESSMENT  AND PLAN  Takerra Cerritos is a 70 y.o. female   Headache, right-sided neck pain following fall in January 2022  Normal examination except significant tenderness around right nuchal line  CT scan of the brain showed no acute abnormality, evidence of mild bifrontal atrophy  Strong family history of dementia, also migraine headaches,  She is very concerned about her frequent headaches, with her big responsibility of taking care of multiple family members, she wants to make sure there is no underlying brain pathology, especially with her history of breast cancer, for more extensive imaging study, MRI of the brain with without contrast  Laboratory evaluation including ESR C-reactive protein  Her headache is triggered by right musculoskeletal pain, I have suggested heating pad, massage therapy, referral to physical therapy,  May take muscle relaxant with Aleve as needed  DIAGNOSTIC DATA (LABS, IMAGING, TESTING) - I reviewed patient records, labs, notes, testing and imaging myself where available.   HISTORICAL  Shakeera Grater, is a 70 year old female, seen in request by her primary care physician Dr. Jacelyn Grip, Edwyna Shell, for evaluation of frequent headaches, right occipital area tenderness, initial evaluation was on December 05, 2020.   I reviewed and summarized the referring note. PMHx  Depression, celexa 6m, trazodone 532mqhs. Right breast cancer 2001, surgery, chemo, radiation therapy.  She reported extreme stress over the past many years, often felt tension in her shoulder, occasionally headaches.  She has strong family history of migraine headaches, her father, sister has migraines,  Since young, she has occasionally headache, few times each year around menstruation, but due to extreme stress  She complains of increased headaches  since she fell in January 2022, she is tripped on a bathroom rug, fell forward, then bounced backwards, with transient loss of consciousness, develop significant headache holoacranial, occipital area pain next day, was seen by primary care physician, had repeat CAT scan on January 2, 3, 2022, personally reviewed the film, no acute abnormality, mild atrophy involving bilateral frontal region  Over the past few months, she manages her headache by taking Tylenol only very clearly, but she has headache 3-4 times each week, during intense headache she complains of light noise sensitivity, her headache would improve by lying down in a dark quiet room for few hours,   She also suffered a severe bouts of headache in August 2021 following her upper respiratory infection, bronchitis, significant cough, had a CT head in August 2021 that was essentially normal  She fell again few days ago slipped and fell landed on her right shoulder, since then she has worsening right nuchal area pain, but denies radiating pain to bilateral upper extremity, denies gait abnormality.  UPDATE Mar 27 2021: Today patient complains of headache, 5 days out of 7, very painful, she tried to avoid over-the-counter medications, sometimes ibuprofen up to 3 tablets seems to provide some help, oftentimes started at the right mastoid region, spreading forward, become lateralized severe pressure headaches, after overnight sleep her headache usually improved  Previous nerve block on December 05, 2020 was helpful, but the benefit only last for few weeks  Tried Imitrex 25 mg as needed, which did not help her much, she is not taking trazodone 50 mg every night for sleep, also on Celexa 20 mg daily for anxiety   PHYSICAL EXAM:   Vitals:  03/27/21 0831  BP: 121/68  Pulse: 67  Weight: 128 lb (58.1 kg)  Height: '5\' 4"'  (1.626 m)   Not recorded     Body mass index is 21.97 kg/m.  PHYSICAL EXAMNIATION:  Gen: NAD, conversant, well nourised,  well groomed                     Cardiovascular: Regular rate rhythm, no peripheral edema, warm, nontender. Eyes: Conjunctivae clear without exudates or hemorrhage Neck: Supple, no carotid bruits. Pulmonary: Clear to auscultation bilaterally   NEUROLOGICAL EXAM:  MENTAL STATUS: Speech/cognition: Awake, alert, oriented to history taking and casual conversation,   CRANIAL NERVES: CN II: Visual fields are full to confrontation. Pupils are round equal and briskly reactive to light. CN III, IV, VI: extraocular movement are normal. No ptosis. CN V: Facial sensation is intact to light touch CN VII: Face is symmetric with normal eye closure  CN VIII: Hearing is normal to causal conversation. CN IX, X: Phonation is normal. CN XI: Head turning and shoulder shrug are intact  MOTOR: Significant right mastoid tenderness upon deep palpitation There is no pronator drift of out-stretched arms. Muscle bulk and tone are normal. Muscle strength is normal.  REFLEXES: Reflexes are 2+ and symmetric at the biceps, triceps, knees, and ankles. Plantar responses are flexor.  SENSORY: Intact to light touch, pinprick and vibratory sensation are intact in fingers and toes.  COORDINATION: There is no trunk or limb dysmetria noted.  GAIT/STANCE: Posture is normal. Gait is steady with normal steps, base, arm swing, and turning. Heel and toe walking are normal. Tandem gait is normal.  Romberg is absent.  REVIEW OF SYSTEMS:  Full 14 system review of systems performed and notable only for as above All other review of systems were negative.   ALLERGIES: Allergies  Allergen Reactions   Codeine     RINGING IN THE EARS.    Oxycodone Itching   Ibuprofen Rash   Penicillins Swelling and Rash    HOME MEDICATIONS: Current Outpatient Medications  Medication Sig Dispense Refill   Biotin 10 MG CAPS Take by mouth.     Cholecalciferol (VITAMIN D3) LIQD by Does not apply route.     citalopram (CELEXA) 20 MG  tablet Take 20 mg by mouth daily.     Multiple Vitamins-Minerals (CENTRUM SILVER PO) Take 1 tablet by mouth daily.     SUMAtriptan (IMITREX) 25 MG tablet Take 1 tablet (25 mg total) by mouth every 2 (two) hours as needed for migraine. May repeat in 2 hours if headache persists or recurs. 10 tablet 6   traZODone (DESYREL) 50 MG tablet Take 50 mg by mouth at bedtime.      vitamin B-12 (CYANOCOBALAMIN) 100 MCG tablet Take 1,000 mcg by mouth daily.      No current facility-administered medications for this visit.    PAST MEDICAL HISTORY: Past Medical History:  Diagnosis Date   Anxiety and depression    Breast cancer ([place]) 2001   RIGHT SIDE. S/P CHEMOTHERAPY   Headache    Mitral valve regurgitation    Osteoporosis     PAST SURGICAL HISTORY: Past Surgical History:  Procedure Laterality Date   APPENDECTOMY     AT AGE 7 YEARS OLD   BREAST LUMPECTOMY Right 2001   AT AGE 73 YEARS OLD   CHOLECYSTECTOMY      FAMILY HISTORY: Family History  Problem Relation Age of Onset   Dementia Mother 14   Diabetes Mother  Hypertension Mother    Melanoma Father    Mental illness Father    CVA Father    Breast cancer Sister    Emphysema Paternal Grandmother        never smoker   Asthma Sister        had asthma as a child    SOCIAL HISTORY: Social History   Socioeconomic History   Marital status: Widowed    Spouse name: Not on file   Number of children: 2   Years of education: COLLEGE   Highest education level: Not on file  Occupational History   Occupation: RETIRED  Tobacco Use   Smoking status: Never   Smokeless tobacco: Never  Vaping Use   Vaping Use: Never used  Substance and Sexual Activity   Alcohol use: Yes    Comment: 2 drinks per day   Drug use: No   Sexual activity: Not on file  Other Topics Concern   Not on file  Social History Narrative   Lives with significant other and mother. 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   Right-handed.   2-3 cups caffeine per day.    Social Determinants of Health   Financial Resource Strain: Not on file  Food Insecurity: Not on file  Transportation Needs: Not on file  Physical Activity: Not on file  Stress: Not on file  Social Connections: Not on file  Intimate Partner Violence: Not on file      Marcial Pacas, M.D. Ph.D.  [facility] [address] [place], [place] [address] Ph: [phone] Fax: [phone]  CC:  Vernie Shanks, MD [place],   [id number]  Vernie Shanks, MD

## 2021-03-28 LAB — COMPREHENSIVE METABOLIC PANEL
ALT: 18 IU/L (ref 0–32)
AST: 28 IU/L (ref 0–40)
Albumin/Globulin Ratio: 2.9 — ABNORMAL HIGH (ref 1.2–2.2)
Albumin: 5 g/dL — ABNORMAL HIGH (ref 3.8–4.8)
Alkaline Phosphatase: 58 IU/L (ref 44–121)
BUN/Creatinine Ratio: 15 (ref 12–28)
BUN: 11 mg/dL (ref 8–27)
Bilirubin Total: 0.5 mg/dL (ref 0.0–1.2)
CO2: 24 mmol/L (ref 20–29)
Calcium: 10.7 mg/dL — ABNORMAL HIGH (ref 8.7–10.3)
Chloride: 98 mmol/L (ref 96–106)
Creatinine, Ser: 0.71 mg/dL (ref 0.57–1.00)
Globulin, Total: 1.7 g/dL (ref 1.5–4.5)
Glucose: 85 mg/dL (ref 70–99)
Potassium: 4.2 mmol/L (ref 3.5–5.2)
Sodium: 136 mmol/L (ref 134–144)
Total Protein: 6.7 g/dL (ref 6.0–8.5)
eGFR: 92 mL/min/{1.73_m2} (ref >59)

## 2021-03-28 LAB — CK: Total CK: 125 U/L (ref 32–182)

## 2021-03-28 LAB — C-REACTIVE PROTEIN: CRP: <1 mg/L (ref 0–10)

## 2021-03-28 LAB — SEDIMENTATION RATE: Sed Rate: 2 mm/hr (ref 0–40)

## 2021-03-28 LAB — ANA W/REFLEX IF POSITIVE: Anti Nuclear Antibody (ANA): NEGATIVE

## 2021-03-28 LAB — TSH: TSH: 1.4 u[IU]/mL (ref 0.450–4.500)

## 2021-04-09 ENCOUNTER — Ambulatory Visit: Payer: Medicare Other | Attending: Neurology

## 2021-04-09 ENCOUNTER — Other Ambulatory Visit: Payer: Self-pay

## 2021-04-09 DIAGNOSIS — G4486 Cervicogenic headache: Secondary | ICD-10-CM | POA: Insufficient documentation

## 2021-04-09 DIAGNOSIS — G8929 Other chronic pain: Secondary | ICD-10-CM | POA: Insufficient documentation

## 2021-04-09 DIAGNOSIS — M5441 Lumbago with sciatica, right side: Secondary | ICD-10-CM | POA: Diagnosis not present

## 2021-04-09 DIAGNOSIS — Z853 Personal history of malignant neoplasm of breast: Secondary | ICD-10-CM | POA: Diagnosis not present

## 2021-04-09 DIAGNOSIS — R519 Headache, unspecified: Secondary | ICD-10-CM | POA: Diagnosis not present

## 2021-04-09 DIAGNOSIS — M542 Cervicalgia: Secondary | ICD-10-CM | POA: Insufficient documentation

## 2021-04-09 NOTE — Therapy (Signed)
OUTPATIENT PHYSICAL THERAPY CERVICAL EVALUATION   Patient Name: Mariah Romero MRN: [id number] DOB:23-Jan-1951, 70 y.o., female Today's Date: 04/09/2021   PT End of Session - 04/09/21 1322     Visit Number 1    Number of Visits 13    Date for PT Re-Evaluation 06/04/21    Authorization Type [employer] $30 copay    PT Start Time 1315    PT Stop Time 1400    PT Time Calculation (min) 45 min    Activity Tolerance Patient tolerated treatment well    Behavior During Therapy WFL for tasks assessed/performed             Past Medical History:  Diagnosis Date   Anxiety and depression    Breast cancer ([place]) 2001   RIGHT SIDE. S/P CHEMOTHERAPY   Headache    Mitral valve regurgitation    Osteoporosis    Past Surgical History:  Procedure Laterality Date   APPENDECTOMY     AT AGE 67 YEARS OLD   BREAST LUMPECTOMY Right 2001   AT AGE 35 YEARS OLD   CHOLECYSTECTOMY     Patient Active Problem List   Diagnosis Date Noted   New onset of headaches 03/27/2021   History of breast cancer 03/27/2021   Bilateral headaches 12/05/2020   Neck pain on right side 12/05/2020   Cough variant asthma vs uacs  10/07/2017   Mitral valve insufficiency 09/22/2016    PCP: Vernie Shanks, MD  REFERRING PROVIDER: Marcial Pacas, MD  REFERRING DIAG: R51.9 (ICD-10-CM) - New onset of headaches Z85.3 (ICD-10-CM) - History of breast cancer   THERAPY DIAG:  Neck pain  Cervicogenic headache  Chronic bilateral low back pain with right-sided sciatica  ONSET DATE: 03/27/21  SUBJECTIVE:                                                                                                                                                                                                         SUBJECTIVE STATEMENT: Pt reports of worsening headaches over the last year. Pt also reports of R sided neck pain after fall in January 2022. During the fall in January, she had n/v and "spinning sensation" dizziness initially.  Currently she is not feeling dizziness. Headaches are happening 3 days /week. They usually come on at night in the morning sometimes she wakes up with headache and after 2 cups of coffee it usually goes away. Has had nerve block in 11/2020 but it was only helpful for few weeks. Pt is reporting recent onset of lower back pain that sometimes goes  down to R posteriro leg to knee. It is worst with sitting.  PERTINENT HISTORY:  Hx of vertigo like symptoms  PAIN:  Are you having pain? Yes VAS scale: 8/10 (neck pain/headache);  Pain location: Neck pain/headache, lower back Pain orientation: Right and Left  PAIN TYPE: aching and sharp Pain description: intermittent  Aggravating factors: looking down, sleeping, bending forward, bending backward, first few steps after standing Relieving factors: rest, medications  PRECAUTIONS: None  WEIGHT BEARING RESTRICTIONS No  FALLS:  Has patient fallen in last 6 months? No Number of falls: 0  LIVING ENVIRONMENT: Lives with: lives with their spouse OCCUPATION: retired  PLOF: Independent  PATIENT GOALS improve neck pain and headaches  OBJECTIVE:   DIAGNOSTIC FINDINGS:  06/03/20: CT of head: FINDINGS: Brain: Mild atrophy. No evidence of acute infarction, hemorrhage, hydrocephalus, extra-axial collection or mass lesion/mass effect.   Vascular: No hyperdense vessel or unexpected calcification.   Skull: Normal. Negative for fracture or focal lesion.   Sinuses/Orbits: No acute finding.   Other: None  COGNITION: Overall cognitive status: Within functional limits for tasks assessed     CERVICAL AROM/PROM  A/PROM A/PROM (deg) pre session 04/09/2021 AROM Post manual therapy  Flexion 70   Extension 65   Right lateral flexion 30   Left lateral flexion 26   Right rotation 66 85  Left rotation 71 75   (Blank rows = not tested)  Lumbar AROM  A/PROM A/PROM (deg) 04/09/2021  Flexion 100% but very guarded and had to use hands to walk them up  the leg to get back to standing  Extension 10% painful  Right lateral flexion 100%  Left lateral flexion 10% painful    UE AROM/PROM:    LE MMT: 5/5 bil hip flexion, hip abduction, hip adduction  UE MMT: 5/5 grossly with bil shoulder flexion, abduction, external rotation, elbow flexion, extension and grip strength   SPINAL SEGMENTAL MOBILITY:  Decreased upper thoracic mobility, decreased 1st rib mobility bil, decreased costovertebral mobility bil in upper thoracic spine  FUNCTIONAL TESTS:  5 times sit to stand: 10.85 sec  TODAY'S TREATMENT:  R 1st rib mobilization Movement with mobilization: R lateral glide of pelvis with left lumbar flexion: 10x Therex: Standing pelvis shift to R: 20x Seated 1st rib mobilization with pt mobilizingng 1st rib forward and down with R hand as she looks down with her head turned to left   PATIENT EDUCATION:  Education details: discussed findings of the session Person educated: Patient Education method: Explanation Education comprehension: verbalized understanding   HOME EXERCISE PROGRAM: Self 1st rib mobilization, standing pelvis shift to R   ASSESSMENT:  CLINICAL IMPRESSION: Patient is a 70 y.o. female who was seen today for physical therapy evaluation and treatment for neck pain and headaches. Objective impairments include decreased ROM, decreased strength, dizziness, increased fascial restrictions, increased muscle spasms, impaired flexibility, postural dysfunction, and pain. These impairments are limiting patient from cleaning, driving, and sleeping . Personal factors including Past/current experiences, Time since onset of injury/illness/exacerbation, and 1 comorbidity: hx of vertigo  are also affecting patient's functional outcome. Patient will benefit from skilled PT to address above impairments and improve overall function.  REHAB POTENTIAL: Good  CLINICAL DECISION MAKING: Stable/uncomplicated  EVALUATION COMPLEXITY:  Low   GOALS: Goals reviewed with patient? Yes  SHORT TERM GOALS:  STG Name Target Date Goal status  1 Pt will report 50% reduction in her headache at night to improve sleeping Baseline: 3 headaches per week 05/07/2021 INITIAL  2 Pt will  report 50% reduction in her back pain with sit to stand transfers Baseline: It takes 10 steps before patient can ully stand up straight after sitting 05/07/2021 INITIAL   LONG TERM GOALS:   LTG Name Target Date Goal status  1 Pt will demo at least 75 deg of pain free cervical ROM with bil rotation to improve ability to drive [id number] deg to R; 71 deg to left (eval) 06/04/2021 INITIAL  2 Pt will demo 100% lumbar ROM without pain to improve ability to bend and pick things off the floor Baseline: extension in L lateral flexion are 10% with pain (Eval) 06/04/2021 INITIAL  3 Pt will report headache frequency to <2/month to improve overall function Baseline:3 headaches/week (eval) 06/04/2021 INITIAL   PLAN: PT FREQUENCY: 1x/week  PT DURATION: 8 weeks  PLANNED INTERVENTIONS: Therapeutic exercises, Therapeutic activity, Neuro Muscular re-education, Balance training, Gait training, Patient/Family education, Joint mobilization, Vestibular training, Canalith repositioning, Spinal mobilization, Cryotherapy, Moist heat, Traction, and Manual therapy  PLAN FOR NEXT SESSION: Review HEP, continue with 1st rib mobilization, core stabilization   Kerrie Pleasure, PT 04/09/2021, 2:21 PM

## 2021-04-13 ENCOUNTER — Ambulatory Visit
Admission: RE | Admit: 2021-04-13 | Discharge: 2021-04-13 | Disposition: A | Discharge status: Home / Self Care | Payer: Medicare Other | Source: Ambulatory Visit | Attending: Neurology | Admitting: Neurology

## 2021-04-13 DIAGNOSIS — Z853 Personal history of malignant neoplasm of breast: Secondary | ICD-10-CM

## 2021-04-13 DIAGNOSIS — R519 Headache, unspecified: Secondary | ICD-10-CM | POA: Diagnosis not present

## 2021-04-13 MED ORDER — GADOBENATE DIMEGLUMINE 529 MG/ML IV SOLN
11.0000 mL | Freq: Once | INTRAVENOUS | Status: AC | PRN
  Administered 2021-04-13: 11 mL via INTRAVENOUS

## 2021-04-17 ENCOUNTER — Ambulatory Visit: Payer: Medicare Other

## 2021-04-17 ENCOUNTER — Other Ambulatory Visit: Payer: Self-pay

## 2021-04-17 DIAGNOSIS — M542 Cervicalgia: Secondary | ICD-10-CM

## 2021-04-17 DIAGNOSIS — M5441 Lumbago with sciatica, right side: Secondary | ICD-10-CM | POA: Diagnosis not present

## 2021-04-17 DIAGNOSIS — G8929 Other chronic pain: Secondary | ICD-10-CM | POA: Diagnosis not present

## 2021-04-17 DIAGNOSIS — G4486 Cervicogenic headache: Secondary | ICD-10-CM

## 2021-04-17 DIAGNOSIS — R519 Headache, unspecified: Secondary | ICD-10-CM | POA: Diagnosis not present

## 2021-04-17 DIAGNOSIS — Z853 Personal history of malignant neoplasm of breast: Secondary | ICD-10-CM | POA: Diagnosis not present

## 2021-04-17 NOTE — Therapy (Signed)
OUTPATIENT PHYSICAL THERAPY TREATMENT NOTE   Patient Name: Mariah Romero MRN: [id number] DOB:1950-06-24, 70 y.o., female Today's Date: 04/17/2021  PCP: Vernie Shanks, MD   REFERRING PROVIDER: Marcial Pacas, MD   REFERRING DIAG: R51.9 (ICD-10-CM) - New onset of headaches Z85.3 (ICD-10-CM) - History of breast cancer    ONSET DATE: 03/27/21   PT End of Session - 04/17/21 1531     Visit Number 2    Number of Visits 13    Date for PT Re-Evaluation 06/04/21    Authorization Type [employer] $30 copay    PT Start Time 2878    PT Stop Time 1530    PT Time Calculation (min) 45 min    Activity Tolerance Patient tolerated treatment well    Behavior During Therapy WFL for tasks assessed/performed             Past Medical History:  Diagnosis Date   Anxiety and depression    Breast cancer ([place]) 2001   RIGHT SIDE. S/P CHEMOTHERAPY   Headache    Mitral valve regurgitation    Osteoporosis    Past Surgical History:  Procedure Laterality Date   APPENDECTOMY     AT AGE 62 YEARS OLD   BREAST LUMPECTOMY Right 2001   AT AGE 83 YEARS OLD   CHOLECYSTECTOMY     Patient Active Problem List   Diagnosis Date Noted   New onset of headaches 03/27/2021   History of breast cancer 03/27/2021   Bilateral headaches 12/05/2020   Neck pain on right side 12/05/2020   Cough variant asthma vs uacs  10/07/2017   Mitral valve insufficiency 09/22/2016    THERAPY DIAG:  Neck pain  Cervicogenic headache  Chronic bilateral low back pain with right-sided sciatica  PERTINENT HISTORY: hx of vertigo like symptoms  PRECAUTIONS: none  SUBJECTIVE: Pt reports she has had 2 headaches since last session. They come on at the of the day at night and usually gone by the morning.   PAIN:  Are you having pain? Yes VAS scale: 2/10 Pain location: R side of neck Pain orientation: Right  PAIN TYPE: aching Pain description: intermittent  Aggravating factors: turning head Relieving factors: rest, medication      OBJECTIVE:   CERVICAL AROM/PROM   A/PROM A/PROM (deg) pre session 04/09/2021 AROM Post manual therapy 06/09/20 AROM 04/11/21 Post session  Flexion 70     Extension 65     Right lateral flexion 30     Left lateral flexion 26     Right rotation 66 85 85  Left rotation 71 75 82   (Blank rows = not tested)  TODAY'S TREATMENT:  04/17/21: Grade IV lateral glides in lower cervical and upper thoracic spine and upper cervical spine bil Grade IV central and unilateral on R PA mobilization in upper and mid thoracic spine Grade V thoracic extension mobilization in upper thoracic spine Prone on elbows cervical retraction with extension: 10x Seated lateral flexion stretch: 3 x 20" R and L    PATIENT EDUCATION:  Education details: discussed findings of the session Person educated: Patient Education method: Explanation Education comprehension: verbalized understanding     HOME EXERCISE PROGRAM: Self 1st rib mobilization, standing pelvis shift to R- discontinued both stretche on 04/17/21 Added Access Code [email or web address]   ASSESSMENT:   CLINICAL IMPRESSION: Pt has significant tightness in R C4-7 region which limits her R lateral bending, L lateral bending and L rotation. She also has hypomobility in upper and mid thoracic spine. HEP was  updated to address this issues.   REHAB POTENTIAL: Good   CLINICAL DECISION MAKING: Stable/uncomplicated   EVALUATION COMPLEXITY: Low     GOALS: Goals reviewed with patient? Yes   SHORT TERM GOALS:   STG Name Target Date Goal status  1 Pt will report 50% reduction in her headache at night to improve sleeping Baseline: 3 headaches per week 05/07/2021 INITIAL  2 Pt will report 50% reduction in her back pain with sit to stand transfers Baseline: It takes 10 steps before patient can ully stand up straight after sitting 05/07/2021 INITIAL    LONG TERM GOALS:    LTG Name Target Date Goal status  1 Pt will demo at least 75 deg of pain free cervical  ROM with bil rotation to improve ability to drive [id number] deg to R; 71 deg to left (eval) 06/04/2021 INITIAL  2 Pt will demo 100% lumbar ROM without pain to improve ability to bend and pick things off the floor Baseline: extension in L lateral flexion are 10% with pain (Eval) 06/04/2021 INITIAL  3 Pt will report headache frequency to <2/month to improve overall function Baseline:3 headaches/week (eval) 06/04/2021 INITIAL    PLAN: PT FREQUENCY: 1x/week   PT DURATION: 8 weeks   PLANNED INTERVENTIONS: Therapeutic exercises, Therapeutic activity, Neuro Muscular re-education, Balance training, Gait training, Patient/Family education, Joint mobilization, Vestibular training, Canalith repositioning, Spinal mobilization, Cryotherapy, Moist heat, Traction, and Manual therapy   PLAN FOR NEXT SESSION: Review HEP, continue with 1st rib mobilization, core stabilization     Kerrie Pleasure, PT 04/17/2021, 3:33 PM

## 2021-05-02 ENCOUNTER — Ambulatory Visit: Payer: Medicare Other

## 2021-05-09 ENCOUNTER — Ambulatory Visit: Payer: Medicare Other

## 2021-05-12 ENCOUNTER — Encounter: Payer: Self-pay | Admitting: Family

## 2021-05-12 ENCOUNTER — Ambulatory Visit (INDEPENDENT_AMBULATORY_CARE_PROVIDER_SITE_OTHER): Payer: Medicare Other | Admitting: Family

## 2021-05-12 ENCOUNTER — Other Ambulatory Visit: Payer: Self-pay

## 2021-05-12 VITALS — BP 130/70 | HR 64 | Temp 97.0°F | Ht 64.0 in | Wt 128.4 lb

## 2021-05-12 DIAGNOSIS — Z7689 Persons encountering health services in other specified circumstances: Secondary | ICD-10-CM

## 2021-05-12 DIAGNOSIS — R519 Headache, unspecified: Secondary | ICD-10-CM

## 2021-05-12 DIAGNOSIS — F321 Major depressive disorder, single episode, moderate: Secondary | ICD-10-CM

## 2021-05-12 DIAGNOSIS — M810 Age-related osteoporosis without current pathological fracture: Secondary | ICD-10-CM | POA: Diagnosis not present

## 2021-05-12 DIAGNOSIS — E785 Hyperlipidemia, unspecified: Secondary | ICD-10-CM | POA: Diagnosis not present

## 2021-05-12 DIAGNOSIS — N644 Mastodynia: Secondary | ICD-10-CM

## 2021-05-12 DIAGNOSIS — G8929 Other chronic pain: Secondary | ICD-10-CM | POA: Diagnosis not present

## 2021-05-12 DIAGNOSIS — I340 Nonrheumatic mitral (valve) insufficiency: Secondary | ICD-10-CM

## 2021-05-12 DIAGNOSIS — F5101 Primary insomnia: Secondary | ICD-10-CM

## 2021-05-12 DIAGNOSIS — Z636 Dependent relative needing care at home: Secondary | ICD-10-CM | POA: Diagnosis not present

## 2021-05-12 DIAGNOSIS — E559 Vitamin D deficiency, unspecified: Secondary | ICD-10-CM | POA: Diagnosis not present

## 2021-05-12 DIAGNOSIS — E538 Deficiency of other specified B group vitamins: Secondary | ICD-10-CM | POA: Diagnosis not present

## 2021-05-12 NOTE — Progress Notes (Signed)
Provider: Ein Rijo FNP-C   Vernie Shanks, MD  Patient Care Team: Vernie Shanks, MD as PCP - General (Family Medicine) Mariah Booze, MD as PCP - Cardiology (Cardiology) Marcial Pacas, MD as Consulting Physician (Neurology)  Extended Emergency Contact Information Primary Emergency Contact: [name] Phone: [phone] Relation: Significant other Secondary Emergency Contact: [place] Phone: [phone] Relation: Son  Code Status:  Full Code  Goals of care: Advanced Directive information Advanced Directives 05/12/2021  Does Patient Have a Medical Advance Directive? No  Type of Advance Directive -  Would patient like information on creating a medical advance directive? -     Chief Complaint  Patient presents with   Establish Care    New patient to establish care. Patient had tenderness and throbbing in area of breast where she had lumpectomy. Patient had breast cancer 2 years ago. Patient would like to discuss pap smears. She has beem having headaches since fall in [place] she is seeing PT for that. Since having COVID she has more fatigue    HPI:  Pt is a 70 y.o. female seen today to Establish care for medical management of chronic diseases.Has a medical history migraine headache,right breast cancer,Hyperlipidemia,major depression,Mitral valve regurgitation ,Osteoporosis,vitamin D deficiency,vitamin B 12 deficiency,chronic fatigue among others.  Complains of right breast tenderness with constant throbbing pain.concerned with breast tenderness since she had lumpectomy in 2001 states latest mammogram done 12/2020 did not show any abnormalities but thinks mammogram does not scan well area of concern.  Has had fatigue since she had COVID-19 early this year. Tends to get tired easily. She denies any shortness of breath.  On vitamin B 12 supplement.   Headaches - occurs on right side of the head.off imitrex states  had a Fall in January,2022 tripped in the bathroom which started the headache.follows up with Neurologist Dr.Yan at [facility]  Also has PT exercises.   Depression - started after the son was diagnosed with schizophrenia has improved since treated.Celexa has been effective.would like to wean off. Also has increase stress level caring for her elderly mother though states this has improved since her other sisters are now helping takes turn for her mother to visit one for few months at the time in [place] and [place].she stills has her for about three months.Has tried care giver support groups but did not like it.   Has hx of mitral valve regurgitation and heart murmur.states HR sometimes goes up to 114 b/minute.Mostly occurs at night feels like skipped beat.denies any dizziness,chest tightness,dyspnea,chest pain,edema ,shortness of breath or syncope. Follows up with cardiologist Dr.Varanasi Jayadeep last seen 12/04/2020.plans for Heart monitor. Has never smoked or use any tobacco. She does drink 14 glasses of alcohol per week. Drinks coffee. Does little exercise by walking and has stairs at home that she climbs.  Has no difficulties performing her ADL's  or managing her medication.   Takes Trazodone for insomnia.   Will obtain previous records to verify immunization for Pneumococcal ,Tdap and Zoster vaccine.Also need to verify last colonoscopy  Has had 2 COVID-19 vaccine states won't be getting any more of the booster vaccine since she had COVID-19 infection already.  Osteoporosis - latest bone density reviewed done 06/21/2019 Left femur Neck T- score was -2.7 current on calcium -vit D supplement.     Past Medical History:  Diagnosis Date   Anxiety and depression    Breast cancer ([place]) 2001   RIGHT SIDE. S/P CHEMOTHERAPY  COVID 11/29/2020   Headache    Mitral valve regurgitation    Osteoporosis    Past Surgical History:  Procedure Laterality Date   APPENDECTOMY     AT AGE  72 YEARS OLD   BREAST LUMPECTOMY Right 2001   AT AGE 68 YEARS OLD   CHOLECYSTECTOMY     GALLBLADDER SURGERY  2012    Allergies  Allergen Reactions   Codeine     RINGING IN THE EARS.    Other Other (See Comments)    PAIN KILLERS   Oxycodone Itching   Sulfa Antibiotics    Ibuprofen Rash   Penicillins Swelling and Rash    Allergies as of 05/12/2021       Reactions   Codeine    RINGING IN THE EARS.   Other Other (See Comments)   PAIN KILLERS   Oxycodone Itching   Sulfa Antibiotics    Ibuprofen Rash   Penicillins Swelling, Rash        Medication List        Accurate as of May 12, 2021  2:11 PM. If you have any questions, ask your nurse or doctor.          STOP taking these medications    CENTRUM SILVER PO Stopped by: Otis Peak, CMA   SUMAtriptan 25 MG tablet Commonly known as: Imitrex Stopped by: Otis Peak, CMA   Vitamin D3 Liqd Stopped by: Otis Peak, CMA       TAKE these medications    BENEFIBER DRINK MIX PO Take 1 Package by mouth daily.   Biotin 10 MG Caps Take by mouth. What changed: Another medication with the same name was removed. Continue taking this medication, and follow the directions you see here. Changed by: Otis Peak, CMA   calcium citrate 950 (200 Ca) MG tablet Commonly known as: CALCITRATE - dosed in mg elemental calcium Take 200 mg of elemental calcium by mouth daily.   citalopram 20 MG tablet Commonly known as: CELEXA Take 20 mg by mouth daily. What changed: Another medication with the same name was removed. Continue taking this medication, and follow the directions you see here. Changed by: Otis Peak, CMA   folic acid 818 MCG tablet Commonly known as: FOLVITE Take 400 mcg by mouth daily.   methocarbamol 500 MG tablet Commonly known as: ROBAXIN Take 500 mg by mouth daily as needed for muscle spasms. What changed: Another medication with the same name was removed. Continue  taking this medication, and follow the directions you see here. Changed by: Otis Peak, CMA   PROBIOTIC-10 PO Take by mouth.   traZODone 50 MG tablet Commonly known as: DESYREL Take 50 mg by mouth at bedtime. What changed: Another medication with the same name was removed. Continue taking this medication, and follow the directions you see here. Changed by: Otis Peak, CMA   Vitamin B-12 5000 MCG Tbdp Take by mouth. What changed: Another medication with the same name was removed. Continue taking this medication, and follow the directions you see here. Changed by: Otis Peak, CMA   Vitamin D Infant 10 MCG/ML Liqd Generic drug: cholecalciferol Take 400 Units by mouth daily.   ZINC PO Take by mouth.        Review of Systems  Constitutional:  Positive for fatigue. Negative for appetite change, chills, fever and unexpected weight change.  HENT:  Positive for rhinorrhea. Negative for congestion, dental problem, ear discharge, ear pain, facial swelling,  hearing loss, nosebleeds, postnasal drip, sinus pressure, sinus pain, sneezing, sore throat, tinnitus and trouble swallowing.        Gum bleeding -dentist   Eyes:  Negative for pain, discharge, redness, itching and visual disturbance.       Dry eyes   Respiratory:  Negative for cough, chest tightness, shortness of breath and wheezing.        Breast tenderness/throbbing constantly  Cardiovascular:  Negative for chest pain and leg swelling.       Mitral valve regurgitation with palpitation at times 114 b/min follows up with cardiologist Dr.Varanasi   Gastrointestinal:  Negative for abdominal distention, abdominal pain, blood in stool, constipation, nausea and vomiting.       Flatulence and diarrhea sometimes   Endocrine: Negative for cold intolerance, heat intolerance, polydipsia, polyphagia and polyuria.  Genitourinary:  Negative for difficulty urinating, dysuria, flank pain, frequency and urgency.   Musculoskeletal:  Positive for back pain. Negative for arthralgias, gait problem, joint swelling, myalgias, neck pain and neck stiffness.       Osteoporosis   Skin:  Negative for color change, pallor, rash and wound.  Neurological:  Positive for headaches. Negative for dizziness, syncope, speech difficulty, weakness, light-headedness and numbness.       Follows up with Neurologist Dr[facility]Yan at [facility] for headache   Hematological:  Does not bruise/bleed easily.  Psychiatric/Behavioral:  Positive for agitation and sleep disturbance. Negative for behavioral problems, confusion, hallucinations, self-injury and suicidal ideas. The patient is nervous/anxious.        Depression  Increase stress level caring for elderly mother    Immunization History  Administered Date(s) Administered   PFIZER(Purple Top)SARS-COV-2 Vaccination 07/31/2019, 08/29/2019   Pertinent  Health Maintenance Due  Topic Date Due   COLONOSCOPY (Pts 45-52yr Insurance coverage will need to be confirmed)  Never done   INFLUENZA VACCINE  Never done   MAMMOGRAM  01/04/2023   DEXA SCAN  Completed   Fall Risk 01/19/2020 06/01/2020 05/12/2021  Falls in the past year? - - 0  Was there an injury with Fall? - - 0  Fall Risk Category Calculator - - 0  Fall Risk Category - - Low  Patient Fall Risk Level Low fall risk Low fall risk Low fall risk  Patient at Risk for Falls Due to - - No Fall Risks  Fall risk Follow up - - Falls evaluation completed   Functional Status Survey:    Vitals:   05/12/21 1345  BP: 130/70  Pulse: 64  Temp: (!) 97 F (36.1 C)  TempSrc: Temporal  SpO2: 98%  Weight: 128 lb 6.4 oz (58.2 kg)  Height: '5\' 4"'  (1.626 m)   Body mass index is 22.04 kg/m. Physical Exam Vitals reviewed.  Constitutional:      General: She is not in acute distress.    Appearance: Normal appearance. She is normal weight. She is not ill-appearing or diaphoretic.  HENT:     Head: Normocephalic.     Right Ear:  Tympanic membrane, ear canal and external ear normal. There is no impacted cerumen.     Left Ear: Tympanic membrane, ear canal and external ear normal. There is no impacted cerumen.     Nose: Nose normal. No congestion or rhinorrhea.     Mouth/Throat:     Mouth: Mucous membranes are moist.     Pharynx: Oropharynx is clear. No oropharyngeal exudate or posterior oropharyngeal erythema.  Eyes:     General: No scleral icterus.  Right eye: No discharge.        Left eye: No discharge.     Extraocular Movements: Extraocular movements intact.     Conjunctiva/sclera: Conjunctivae normal.     Pupils: Pupils are equal, round, and reactive to light.  Neck:     Vascular: No carotid bruit.  Cardiovascular:     Rate and Rhythm: Normal rate and regular rhythm.     Pulses: Normal pulses.     Heart sounds: Normal heart sounds. No murmur heard.   No friction rub. No gallop.  Pulmonary:     Effort: Pulmonary effort is normal. No respiratory distress.     Breath sounds: Normal breath sounds. No wheezing, rhonchi or rales.  Chest:     Chest wall: No tenderness.  Breasts:    Right: Tenderness present.    Abdominal:     General: Bowel sounds are normal. There is no distension.     Palpations: Abdomen is soft. There is no mass.     Tenderness: There is no abdominal tenderness. There is no right CVA tenderness, left CVA tenderness, guarding or rebound.  Musculoskeletal:        General: No swelling or tenderness. Normal range of motion.     Cervical back: Normal range of motion. No rigidity or tenderness.     Right lower leg: No edema.     Left lower leg: No edema.  Lymphadenopathy:     Cervical: No cervical adenopathy.  Skin:    General: Skin is warm and dry.     Coloration: Skin is not pale.     Findings: No bruising, erythema, lesion or rash.  Neurological:     Mental Status: She is alert and oriented to person, place, and time.     Cranial Nerves: No cranial nerve deficit.     Sensory:  No sensory deficit.     Motor: No weakness.     Coordination: Coordination normal.     Gait: Gait normal.  Psychiatric:        Mood and Affect: Mood normal.        Speech: Speech normal.        Behavior: Behavior normal.        Thought Content: Thought content normal.        Judgment: Judgment normal.    Labs reviewed: Recent Labs    06/02/20 0337 03/27/21 0915  NA 136 136  K 3.7 4.2  CL 100 98  CO2 23 24  GLUCOSE 84 85  BUN 16 11  CREATININE 0.67 0.71  CALCIUM 9.5 10.7*   Recent Labs    03/27/21 0915  AST 28  ALT 18  ALKPHOS 58  BILITOT 0.5  PROT 6.7  ALBUMIN 5.0*   No results for input(s): WBC, NEUTROABS, HGB, HCT, MCV, PLT in the last 8760 hours. Lab Results  Component Value Date   TSH 1.400 03/27/2021   No results found for: HGBA1C No results found for: CHOL, HDL, LDLCALC, LDLDIRECT, TRIG, CHOLHDL  Significant Diagnostic Results in last 30 days:  MR BRAIN W WO CONTRAST  Result Date: 04/14/2021  [facility] [address] [place], [place] [address] [phone] NEUROIMAGING REPORT STUDY DATE: 04/13/2021 PATIENT NAME: Mariah Romero DOB: 10-18-1950 MRN: [id number] EXAM: MRI Brain with and without contrast ORDERING CLINICIAN: Marcial Pacas MD, PhD CLINICAL HISTORY: 70 year old woman with headaches and history of breast cancer COMPARISON FILMS: None TECHNIQUE:MRI of the brain with and without contrast was obtained utilizing 5 mm axial  slices with T1, T2, T2 flair, SWI and diffusion weighted views.  T1 sagittal, T2 coronal and postcontrast views in the axial and coronal plane were obtained. CONTRAST: 11 ml [employer] IMAGING SITE: [facility], [place]. FINDINGS: On sagittal images, the spinal cord is imaged caudally to C3 and is normal in caliber.   The contents of the posterior fossa are of normal size and position.   The pituitary gland and optic chiasm appear normal.    Brain volume appears normal for age.   The ventricles are  normal in size and without distortion.  There are no abnormal extra-axial collections of fluid.  In the pons and hemispheres, there are a few punctate T2/FLAIR hyperintense foci..  The cerebellum appears normal.   The deep gray matter appears normal.  Diffusion weighted images are normal.  Susceptibility weighted images are normal.   The orbits appear normal.   The VIIth/VIIIth nerve complex appears normal.  The mastoid air cells appear normal.  The paranasal sinuses appear normal.  Flow voids are identified within the vertebral, basilar and internal carotid arteries.  After the infusion of contrast material, a normal enhancement pattern is noted.   This MRI of the brain with and without contrast shows the following: 1.  Scattered punctate T2/FLAIR hyperintense foci in the hemispheres and pons most consistent with mild chronic microvascular ischemic change.  None of the foci appear to be acute. 2.  Normal enhancement pattern. 3.  No acute findings. INTERPRETING PHYSICIAN: Richard A. Felecia Shelling, MD, PhD, FAAN Certified in  Neuroimaging by [employer]    Assessment/Plan  1. Encounter to establish care Available medical records reviewed.will need previous records to update immunization.Declines further COVID-19 booster vaccine. Need records for colonoscopy.   2. Breast pain in female Hx of right breast cancer with lumpectomy done in 2001  Right breast upper outer quadrant old surgical incision noted.Area tender to palpation and lumpy no swelling or drainage noted. Will order diagnostic mammogram ? Ultrasound if indicated.made aware breast imaging center will call for appointment.  - MM DIAG BREAST TOMO UNI RIGHT; Future  3. Hyperlipidemia LDL goal <100 No recent labs for review  - continue on a low fat diet and exercise  - Lipid panel; Future  4. Current moderate episode of major depressive disorder, unspecified whether recurrent ([place]) Mood stable  - continue on Celexa will attempt  gradual dose reduction next visit during the summer.   - Area psychiatry and counseling service numbers given to call and schedule appointment with Counsellor   - CBC with Differential/Platelet; Future - CMP with eGFR([facility]); Future - TSH; Future  5. Vitamin D deficiency Continue on Vitamin D supplement  Check level  - Vitamin D, 1,25-dihydroxy; Future  6. Vitamin B12 deficiency Continue on vitamin B 12 supplement  Will check level  - Vitamin B12; Future  7. Primary insomnia Continue on trazodone  Sleep hygiene   8. Osteoporosis without current pathological fracture, unspecified osteoporosis type bone density reviewed done 06/21/2019 Left femur Neck T- score was -2.7  - continue on calcium -vit D supplement.  - continue weight bearing exercise   9. Nonrheumatic mitral valve regurgitation Negative exam finding today  Continue to follow up with Cardiologist   10. Chronic nonintractable headache, unspecified headache type Has improved.off imtrex  - continue to follow up with Neurologist  11.Caregiver [place]  Cares for her mother who has dementia.stress level has improved since her other two sisters are taking turns to care for  their mother but still has 3 months with her. Encouraged to get a support group and consider personal care assistant to help.  - Area psychiatry and counseling service numbers given to call and schedule appointment with Counsellor    Family/ staff Communication: Reviewed plan of care with patient verbalized understanding.   Labs/tests ordered:  - CBC with Differential/Platelet - CMP with eGFR([facility]) - TSH - Lipid panel - Vitamin B 12 - Hep C Antibody - Vitamin D, 1,25-dihydroxy; Future  Next Appointment : 6 months for medical management of chronic issues.Fasting Labs in one week or sooner.     Sandrea Hughs, NP

## 2021-05-16 ENCOUNTER — Encounter (INDEPENDENT_AMBULATORY_CARE_PROVIDER_SITE_OTHER): Payer: Medicare Other | Admitting: Ophthalmology

## 2021-05-16 ENCOUNTER — Other Ambulatory Visit: Payer: Self-pay | Admitting: Family

## 2021-05-16 ENCOUNTER — Ambulatory Visit: Payer: Medicare Other

## 2021-05-16 ENCOUNTER — Other Ambulatory Visit: Payer: Self-pay

## 2021-05-16 DIAGNOSIS — H43813 Vitreous degeneration, bilateral: Secondary | ICD-10-CM

## 2021-05-16 DIAGNOSIS — H2513 Age-related nuclear cataract, bilateral: Secondary | ICD-10-CM | POA: Diagnosis not present

## 2021-05-16 DIAGNOSIS — N644 Mastodynia: Secondary | ICD-10-CM

## 2021-05-20 ENCOUNTER — Other Ambulatory Visit: Payer: Self-pay

## 2021-05-20 ENCOUNTER — Other Ambulatory Visit: Payer: Medicare Other

## 2021-05-20 DIAGNOSIS — E538 Deficiency of other specified B group vitamins: Secondary | ICD-10-CM

## 2021-05-20 DIAGNOSIS — E559 Vitamin D deficiency, unspecified: Secondary | ICD-10-CM

## 2021-05-20 DIAGNOSIS — E785 Hyperlipidemia, unspecified: Secondary | ICD-10-CM

## 2021-05-20 DIAGNOSIS — F321 Major depressive disorder, single episode, moderate: Secondary | ICD-10-CM

## 2021-05-23 ENCOUNTER — Ambulatory Visit: Payer: Medicare Other

## 2021-05-24 LAB — COMPLETE METABOLIC PANEL WITH GFR
AG Ratio: 2 (calc) (ref 1.0–2.5)
ALT: 16 U/L (ref 6–29)
AST: 23 U/L (ref 10–35)
Albumin: 4.5 g/dL (ref 3.6–5.1)
Alkaline phosphatase (APISO): 44 U/L (ref 37–153)
BUN: 11 mg/dL (ref 7–25)
CO2: 29 mmol/L (ref 20–32)
Calcium: 10 mg/dL (ref 8.6–10.4)
Chloride: 101 mmol/L (ref 98–110)
Creat: 0.64 mg/dL (ref 0.50–1.05)
Globulin: 2.3 g/dL (calc) (ref 1.9–3.7)
Glucose, Bld: 85 mg/dL (ref 65–99)
Potassium: 4 mmol/L (ref 3.5–5.3)
Sodium: 137 mmol/L (ref 135–146)
Total Bilirubin: 0.7 mg/dL (ref 0.2–1.2)
Total Protein: 6.8 g/dL (ref 6.1–8.1)
eGFR: 96 mL/min/{1.73_m2} (ref >=60)

## 2021-05-24 LAB — CBC WITH DIFFERENTIAL/PLATELET
Absolute Monocytes: 428 cells/uL (ref 200–950)
Basophils Absolute: 41 cells/uL (ref 0–200)
Basophils Relative: 1.2 %
Eosinophils Absolute: 150 cells/uL (ref 15–500)
Eosinophils Relative: 4.4 %
HCT: 38.9 % (ref 35.0–45.0)
Hemoglobin: 13.1 g/dL (ref 11.7–15.5)
Lymphs Abs: 945 cells/uL (ref 850–3900)
MCH: 29.8 pg (ref 27.0–33.0)
MCHC: 33.7 g/dL (ref 32.0–36.0)
MCV: 88.6 fL (ref 80.0–100.0)
MPV: 9.9 fL (ref 7.5–12.5)
Monocytes Relative: 12.6 %
Neutro Abs: 1836 cells/uL (ref 1500–7800)
Neutrophils Relative %: 54 %
Platelets: 219 10*3/uL (ref 140–400)
RBC: 4.39 10*6/uL (ref 3.80–5.10)
RDW: 12.6 % (ref 11.0–15.0)
Total Lymphocyte: 27.8 %
WBC: 3.4 10*3/uL — ABNORMAL LOW (ref 3.8–10.8)

## 2021-05-24 LAB — LIPID PANEL
Cholesterol: 273 mg/dL — ABNORMAL HIGH (ref <200)
HDL: 111 mg/dL (ref >=50)
LDL Cholesterol (Calc): 143 mg/dL (calc) — ABNORMAL HIGH
Non-HDL Cholesterol (Calc): 162 mg/dL (calc) — ABNORMAL HIGH (ref <130)
Total CHOL/HDL Ratio: 2.5 (calc) (ref <5.0)
Triglycerides: 85 mg/dL (ref <150)

## 2021-05-24 LAB — VITAMIN D 1,25 DIHYDROXY
Vitamin D 1, 25 (OH)2 Total: 48 pg/mL (ref 18–72)
Vitamin D2 1, 25 (OH)2: <8 pg/mL
Vitamin D3 1, 25 (OH)2: 48 pg/mL

## 2021-05-24 LAB — VITAMIN B12: Vitamin B-12: >2000 pg/mL — ABNORMAL HIGH (ref 200–1100)

## 2021-05-24 LAB — TSH: TSH: 1.34 mIU/L (ref 0.40–4.50)

## 2021-05-27 ENCOUNTER — Encounter: Payer: Self-pay | Admitting: Family

## 2021-05-27 ENCOUNTER — Telehealth: Payer: Self-pay

## 2021-05-27 ENCOUNTER — Other Ambulatory Visit: Payer: Self-pay

## 2021-05-27 ENCOUNTER — Ambulatory Visit (INDEPENDENT_AMBULATORY_CARE_PROVIDER_SITE_OTHER): Payer: Medicare Other | Admitting: Family

## 2021-05-27 DIAGNOSIS — Z0000 Encounter for general adult medical examination without abnormal findings: Secondary | ICD-10-CM | POA: Diagnosis not present

## 2021-05-27 NOTE — Patient Instructions (Signed)
[name] , Thank you for taking time to come for your Medicare Wellness Visit. I appreciate your ongoing commitment to your health goals. Please review the following plan we discussed and let me know if I can assist you in the future.   Screening recommendations/referrals: Colonoscopy : Awaiting records from [facility] : up to date  Bone Density Up to date  Recommended yearly ophthalmology/optometry visit for glaucoma screening and checkup Recommended yearly dental visit for hygiene and checkup  Vaccinations: Influenza vaccine : Received  Pneumococcal vaccine : Awaiting records  Tdap vaccine : Due  Shingles vaccine : completed at the pharmacy.No records     Advanced directives: No   Conditions/risks identified: Advance age female > 17 years old   Next appointment: 1 year    Preventive Care 22 Years and Older, Female Preventive care refers to lifestyle choices and visits with your health care provider that can promote health and wellness. What does preventive care include? A yearly physical exam. This is also called an annual well check. Dental exams once or twice a year. Routine eye exams. Ask your health care provider how often you should have your eyes checked. Personal lifestyle choices, including: Daily care of your teeth and gums. Regular physical activity. Eating a healthy diet. Avoiding tobacco and drug use. Limiting alcohol use. Practicing safe sex. Taking low-dose aspirin every day. Taking vitamin and mineral supplements as recommended by your health care provider. What happens during an annual well check? The services and screenings done by your health care provider during your annual well check will depend on your age, overall health, lifestyle risk factors, and family history of disease. Counseling  Your health care provider may ask you questions about your: Alcohol use. Tobacco use. Drug use. Emotional well-being. Home and relationship  well-being. Sexual activity. Eating habits. History of falls. Memory and ability to understand (cognition). Work and work Statistician. Reproductive health. Screening  You may have the following tests or measurements: Height, weight, and BMI. Blood pressure. Lipid and cholesterol levels. These may be checked every 5 years, or more frequently if you are over 80 years old. Skin check. Lung cancer screening. You may have this screening every year starting at age 65 if you have a 30-pack-year history of smoking and currently smoke or have quit within the past 15 years. Fecal occult blood test (FOBT) of the stool. You may have this test every year starting at age 23. Flexible sigmoidoscopy or colonoscopy. You may have a sigmoidoscopy every 5 years or a colonoscopy every 10 years starting at age 1. Hepatitis C blood test. Hepatitis B blood test. Sexually transmitted disease (STD) testing. Diabetes screening. This is done by checking your blood sugar (glucose) after you have not eaten for a while (fasting). You may have this done every 1-3 years. Bone density scan. This is done to screen for osteoporosis. You may have this done starting at age 38. Mammogram. This may be done every 1-2 years. Talk to your health care provider about how often you should have regular mammograms. Talk with your health care provider about your test results, treatment options, and if necessary, the need for more tests. Vaccines  Your health care provider may recommend certain vaccines, such as: Influenza vaccine. This is recommended every year. Tetanus, diphtheria, and acellular pertussis (Tdap, Td) vaccine. You may need a Td booster every 10 years. Zoster vaccine. You may need this after age 13. Pneumococcal 13-valent conjugate (PCV13) vaccine. One dose is recommended after  age 41. Pneumococcal polysaccharide (PPSV23) vaccine. One dose is recommended after age 43. Talk to your health care provider about which  screenings and vaccines you need and how often you need them. This information is not intended to replace advice given to you by your health care provider. Make sure you discuss any questions you have with your health care provider. Document Released: 06/14/2015 Document Revised: 02/05/2016 Document Reviewed: 03/19/2015 Elsevier Interactive Patient Education  2017 [place] Prevention in the Home Falls can cause injuries. They can happen to people of all ages. There are many things you can do to make your home safe and to help prevent falls. What can I do on the outside of my home? Regularly fix the edges of walkways and driveways and fix any cracks. Remove anything that might make you trip as you walk through a door, such as a raised step or threshold. Trim any bushes or trees on the path to your home. Use bright outdoor lighting. Clear any walking paths of anything that might make someone trip, such as rocks or tools. Regularly check to see if handrails are loose or broken. Make sure that both sides of any steps have handrails. Any raised decks and porches should have guardrails on the edges. Have any leaves, snow, or ice cleared regularly. Use sand or salt on walking paths during winter. Clean up any spills in your garage right away. This includes oil or grease spills. What can I do in the bathroom? Use night lights. Install grab bars by the toilet and in the tub and shower. Do not use towel bars as grab bars. Use non-skid mats or decals in the tub or shower. If you need to sit down in the shower, use a plastic, non-slip stool. Keep the floor dry. Clean up any water that spills on the floor as soon as it happens. Remove soap buildup in the tub or shower regularly. Attach bath mats securely with double-sided non-slip rug tape. Do not have throw rugs and other things on the floor that can make you trip. What can I do in the bedroom? Use night lights. Make sure that you have a  light by your bed that is easy to reach. Do not use any sheets or blankets that are too big for your bed. They should not hang down onto the floor. Have a firm chair that has side arms. You can use this for support while you get dressed. Do not have throw rugs and other things on the floor that can make you trip. What can I do in the kitchen? Clean up any spills right away. Avoid walking on wet floors. Keep items that you use a lot in easy-to-reach places. If you need to reach something above you, use a strong step stool that has a grab bar. Keep electrical cords out of the way. Do not use floor polish or wax that makes floors slippery. If you must use wax, use non-skid floor wax. Do not have throw rugs and other things on the floor that can make you trip. What can I do with my stairs? Do not leave any items on the stairs. Make sure that there are handrails on both sides of the stairs and use them. Fix handrails that are broken or loose. Make sure that handrails are as long as the stairways. Check any carpeting to make sure that it is firmly attached to the stairs. Fix any carpet that is loose or worn. Avoid having throw rugs  at the top or bottom of the stairs. If you do have throw rugs, attach them to the floor with carpet tape. Make sure that you have a light switch at the top of the stairs and the bottom of the stairs. If you do not have them, ask someone to add them for you. What else can I do to help prevent falls? Wear shoes that: Do not have high heels. Have rubber bottoms. Are comfortable and fit you well. Are closed at the toe. Do not wear sandals. If you use a stepladder: Make sure that it is fully opened. Do not climb a closed stepladder. Make sure that both sides of the stepladder are locked into place. Ask someone to hold it for you, if possible. Clearly mark and make sure that you can see: Any grab bars or handrails. First and last steps. Where the edge of each step  is. Use tools that help you move around (mobility aids) if they are needed. These include: Canes. Walkers. Scooters. Crutches. Turn on the lights when you go into a dark area. Replace any light bulbs as soon as they burn out. Set up your furniture so you have a clear path. Avoid moving your furniture around. If any of your floors are uneven, fix them. If there are any pets around you, be aware of where they are. Review your medicines with your doctor. Some medicines can make you feel dizzy. This can increase your chance of falling. Ask your doctor what other things that you can do to help prevent falls. This information is not intended to replace advice given to you by your health care provider. Make sure you discuss any questions you have with your health care provider. Document Released: 03/14/2009 Document Revised: 10/24/2015 Document Reviewed: 06/22/2014 Elsevier Interactive Patient Education  2017 [employer].

## 2021-05-27 NOTE — Telephone Encounter (Signed)
I called [facility] They have no record of patient receiving Shingrix vaccine or Influenza vaccine since patient has no record of receiving vaccine in office. I also called [facility] and had to leave voicemail requesting Colonoscopy results. Message routed to PCP Ngetich, Nelda Bucks, NP .

## 2021-05-27 NOTE — Progress Notes (Signed)
This service is provided via telemedicine  No vital signs collected/recorded due to the encounter was a telemedicine visit.   Location of patient (ex: home, work):  Home  Patient consents to a telephone visit:  Yes  Location of the provider (ex: office, home):  [employer].   Name of any referring provider:  Camyah Pultz, Nelda Bucks, NP   Names of all persons participating in the telemedicine service and their role in the encounter:  Patient, [name], [place], [place], Webb Silversmith, NP.    Time spent on call: 8 minutes spent on the phone with Medical Assistant.      Subjective:   Mariah Romero is a 70 y.o. female who presents for Medicare Annual (Subsequent) preventive examination.  Review of Systems     Cardiac Risk Factors include: advanced age (>75men, >3 women)     Objective:    Today's Vitals   05/27/21 1447  PainSc: 2    There is no height or weight on file to calculate BMI.  Advanced Directives 05/27/2021 05/12/2021 04/09/2021 06/01/2020 01/19/2020  Does Patient Have a Medical Advance Directive? No No No Yes No  Type of Advance Directive - - Public librarian;Living will -  Would patient like information on creating a medical advance directive? No - Patient declined - No - Patient declined - -    Current Medications (verified) Outpatient Encounter Medications as of 05/27/2021  Medication Sig   Biotin 10 MG CAPS Take by mouth.   calcium citrate (CALCITRATE - DOSED IN MG ELEMENTAL CALCIUM) 950 (200 Ca) MG tablet Take 200 mg of elemental calcium by mouth daily.   cholecalciferol (VITAMIN D INFANT) 10 MCG/ML LIQD Take 400 Units by mouth daily.   citalopram (CELEXA) 20 MG tablet Take 20 mg by mouth daily.   Cyanocobalamin (VITAMIN B-12) 5000 MCG TBDP Take by mouth.   folic acid (FOLVITE) 440 MCG tablet Take 400 mcg by mouth daily.   ibuprofen (ADVIL) 200 MG tablet Take 400 mg by mouth daily as needed.   methocarbamol (ROBAXIN) 500 MG tablet Take  500 mg by mouth daily as needed for muscle spasms.   Probiotic Product (PROBIOTIC-10 PO) Take by mouth.   traZODone (DESYREL) 50 MG tablet Take 50 mg by mouth at bedtime.   Wheat Dextrin (BENEFIBER DRINK MIX PO) Take 1 Package by mouth daily.   [DISCONTINUED] Multiple Vitamins-Minerals (ZINC PO) Take by mouth. (Patient not taking: Reported on 05/12/2021)   No facility-administered encounter medications on file as of 05/27/2021.    Allergies (verified) Codeine, Other, Oxycodone, Sulfa antibiotics, Ibuprofen, and Penicillins   History: Past Medical History:  Diagnosis Date   Anxiety and depression    Breast cancer ([place]) 2001   RIGHT SIDE. S/P CHEMOTHERAPY   COVID 11/29/2020   Headache    Mitral valve regurgitation    Osteoporosis    Past Surgical History:  Procedure Laterality Date   APPENDECTOMY     AT AGE 53 YEARS OLD   BREAST LUMPECTOMY Right 2001   AT AGE 11 YEARS OLD   CHOLECYSTECTOMY     GALLBLADDER SURGERY  2012   Family History  Problem Relation Age of Onset   Dementia Mother 58   Diabetes Mother    Hypertension Mother    Melanoma Father    Mental illness Father    CVA Father    Breast cancer Sister    Emphysema Paternal Grandmother        never smoker   Asthma Sister  had asthma as a child   Social History   Socioeconomic History   Marital status: Widowed    Spouse name: Not on file   Number of children: 2   Years of education: COLLEGE   Highest education level: Not on file  Occupational History   Occupation: RETIRED  Tobacco Use   Smoking status: Never   Smokeless tobacco: Never  Vaping Use   Vaping Use: Never used  Substance and Sexual Activity   Alcohol use: Yes    Comment: 14 drinks a week.   Drug use: No   Sexual activity: Not on file  Other Topics Concern   Not on file  Social History Narrative   Tobacco use, amount per day now: Never   Past tobacco use, amount per day: Never   How many years did you use tobacco:   Alcohol use  (drinks per week): 14   Diet: Low fat   Do you drink/eat things with caffeine: yes   Marital status:   Widowed                               What year were you married? 1981   Do you live in a house, apartment, assisted living, condo, trailer, etc.?    Is it one or more stories? 2   How many persons live in your home? 2-3   Do you have pets in your home?( please list) Sometimes a family dog.   Highest Level of education completed? College.   Current or past profession: Pharmacist, hospital, Forensic psychologist.   Do you exercise? Little                                 Type and how often? Walk & Stair Climbing.   Do you have a living will? No   Do you have a DNR form?    No                               If not, do you want to discuss one?   Do you have signed POA/HPOA forms? No                        If so, please bring to you appointment      Do you have any difficulty bathing or dressing yourself? No   Do you have any difficulty preparing food or eating? No   Do you have any difficulty managing your medications? No   Do you have any difficulty managing your finances? No   Do you have any difficulty affording your medications? No   Social Determinants of Radio broadcast assistant Strain: Not on file  Food Insecurity: Not on file  Transportation Needs: Not on file  Physical Activity: Not on file  Stress: Not on file  Social Connections: Not on file    Tobacco Counseling Counseling given: Not Answered   Clinical Intake:  Pre-visit preparation completed: No  Pain : 0-10 Pain Score: 2  (2 to 7) Pain Type: Chronic pain Pain Location: Back Pain Orientation: Mid Pain Radiating Towards: no Pain Descriptors / Indicators: Aching, Headache Pain Onset: Other (comment) (several years) Pain Frequency: Intermittent Pain Relieving Factors: heating pad,Advil Effect of Pain on Daily Activities: Not really  Pain Relieving Factors:  heating pad,Advil  BMI - recorded: 22.04 Nutritional Status: BMI  of 19-24  Normal Nutritional Risks: None Diabetes: No  How often do you need to have someone help you when you read instructions, pamphlets, or other written materials from your doctor or pharmacy?: 1 - Never What is the last grade level you completed in school?: 4 Colleges  Diabetic?No   Interpreter Needed?: No  Information entered by :: Jeramiah Mccaughey,FNP-C   Activities of Daily Living In your present state of health, do you have any difficulty performing the following activities: 05/27/2021  Hearing? N  Vision? N  Difficulty concentrating or making decisions? N  Walking or climbing stairs? N  Dressing or bathing? N  Doing errands, shopping? N  Preparing Food and eating ? N  Using the Toilet? N  In the past six months, have you accidently leaked urine? N  Do you have problems with loss of bowel control? N  Managing your Medications? N  Managing your Finances? N  Housekeeping or managing your Housekeeping? N  Some recent data might be hidden    Patient Care Team: Gurveer Colucci, Nelda Bucks, NP as PCP - General (Family Medicine) [name], MD as PCP - Cardiology (Cardiology) Marcial Pacas, MD as Consulting Physician (Neurology)  Indicate any recent Medical Services you may have received from other than [facility] providers in the past year (date may be approximate).     Assessment:   This is a routine wellness examination for Mariah Romero.  Hearing/Vision screen No results found.  Dietary issues and exercise activities discussed: Current Exercise Habits: Home exercise routine, Type of exercise: stretching;walking;strength training/weights, Time (Minutes): 30, Frequency (Times/Week): 2, Weekly Exercise (Minutes/Week): 60, Intensity: Moderate   Goals Addressed             This Visit's Progress    Patient Stated       - Would like to increase more exercise walking,weight lifting and strengthening  - Dietary changes        Depression Screen No flowsheet data found.  Fall  Risk Fall Risk  05/27/2021 05/12/2021  Falls in the past year? 0 0  Number falls in past yr: 0 0  Injury with Fall? 0 0  Risk for fall due to : No Fall Risks No Fall Risks  Follow up Falls evaluation completed Falls evaluation completed    [place]:  Any stairs in or around the home? Yes  If so, are there any without handrails? No  Home free of loose throw rugs in walkways, pet beds, electrical cords, etc? No  Adequate lighting in your home to reduce risk of falls? Yes   ASSISTIVE DEVICES UTILIZED TO PREVENT FALLS:  Life alert? No  Use of a cane, walker or w/c? No  Grab bars in the bathroom? yes Shower chair or bench in shower? Yes  Elevated toilet seat or a handicapped toilet? No   TIMED UP AND GO:  Was the test performed? No .  Length of time to ambulate 10 feet: N/A sec.   Gait steady and fast without use of assistive device  Cognitive Function:        Immunizations Immunization History  Administered Date(s) Administered   PFIZER(Purple Top)SARS-COV-2 Vaccination 07/31/2019, 08/29/2019    TDAP status: Due, Education has been provided regarding the importance of this vaccine. Advised may receive this vaccine at local pharmacy or Health Dept. Aware to provide a copy of the vaccination record if obtained from local pharmacy or  Health Dept. Verbalized acceptance and understanding.  Flu Vaccine status: Up to date  Pneumococcal vaccine status: Due, Education has been provided regarding the importance of this vaccine. Advised may receive this vaccine at local pharmacy or Health Dept. Aware to provide a copy of the vaccination record if obtained from local pharmacy or Health Dept. Verbalized acceptance and understanding.  Covid-19 vaccine status: Declined, Education has been provided regarding the importance of this vaccine but patient still declined. Advised may receive this vaccine at local pharmacy or Health Dept.or vaccine clinic.  Aware to provide a copy of the vaccination record if obtained from local pharmacy or Health Dept. Verbalized acceptance and understanding.  Qualifies for Shingles Vaccine? Yes   Zostavax completed Yes   Shingrix Completed?: Yes per patient has completed   Screening Tests Health Maintenance  Topic Date Due   Pneumonia Vaccine 75+ Years old (1 - PCV) Never done   Hepatitis C Screening  Never done   TETANUS/TDAP  Never done   COLONOSCOPY (Pts 45-21yrs Insurance coverage will need to be confirmed)  Never done   Zoster Vaccines- Shingrix (1 of 2) Never done   INFLUENZA VACCINE  Never done   COVID-19 Vaccine (3 - Booster for Pfizer series) 05/28/2021 (Originally 10/24/2019)   MAMMOGRAM  01/04/2023   DEXA SCAN  Completed   HPV VACCINES  Aged Out    Health Maintenance  Health Maintenance Due  Topic Date Due   Pneumonia Vaccine 50+ Years old (1 - PCV) Never done   Hepatitis C Screening  Never done   TETANUS/TDAP  Never done   COLONOSCOPY (Pts 45-46yrs Insurance coverage will need to be confirmed)  Never done   Zoster Vaccines- Shingrix (1 of 2) Never done   INFLUENZA VACCINE  Never done    Colorectal cancer screening: Type of screening: Colonoscopy. Completed Had colonoscopy done by [facility] prior will obtain records. Repeat every advised to repeat in 10  years  Mammogram status: Completed 01/03/2021 . Repeat every year  Bone Density status: Completed 06/21/2019 . Results reflect: Bone density results: OSTEOPOROSIS. Repeat every 2 years.  Lung Cancer Screening: (Low Dose CT Chest recommended if Age 4-80 years, 30 pack-year currently smoking OR have quit w/in 15years.) does not qualify.   Lung Cancer Screening Referral: no   Additional Screening:  Hepatitis C Screening: does not qualify; Completed No declined   Vision Screening: Recommended annual ophthalmology exams for early detection of glaucoma and other disorders of the eye. Is the patient up to date with their annual  eye exam?  Yes  Who is the provider or what is the name of the office in which the patient attends annual eye exams? Dr.Groat  If pt is not established with a provider, would they like to be referred to a provider to establish care? No .   Dental Screening: Recommended annual dental exams for proper oral hygiene  Community Resource Referral / Chronic Care Management: CRR required this visit?  No   CCM required this visit?  No      Plan:     I have personally reviewed and noted the following in the patients chart:   Medical and social history Use of alcohol, tobacco or illicit drugs  Current medications and supplements including opioid prescriptions.  Functional ability and status Nutritional status Physical activity Advanced directives List of other physicians Hospitalizations, surgeries, and ER visits in previous 12 months Vitals Screenings to include cognitive, depression, and falls Referrals and appointments  In addition, I  have reviewed and discussed with patient certain preventive protocols, quality metrics, and best practice recommendations. A written personalized care plan for preventive services as well as general preventive health recommendations were provided to patient.     Sandrea Hughs, NP   05/27/2021   Nurse Notes: - will obtain colonoscopy results done at [facility]  - Had Influenza vaccine already need update  - states has had shingles vaccine at the pharmacy

## 2021-05-27 NOTE — Telephone Encounter (Signed)
Noted  

## 2021-05-28 ENCOUNTER — Encounter: Payer: Self-pay | Admitting: Family

## 2021-05-28 ENCOUNTER — Ambulatory Visit (INDEPENDENT_AMBULATORY_CARE_PROVIDER_SITE_OTHER): Payer: Medicare Other | Admitting: Family

## 2021-05-28 ENCOUNTER — Other Ambulatory Visit: Payer: Self-pay

## 2021-05-28 ENCOUNTER — Telehealth: Payer: Self-pay | Admitting: Family

## 2021-05-28 DIAGNOSIS — F321 Major depressive disorder, single episode, moderate: Secondary | ICD-10-CM | POA: Diagnosis not present

## 2021-05-28 DIAGNOSIS — N951 Menopausal and female climacteric states: Secondary | ICD-10-CM

## 2021-05-28 DIAGNOSIS — E538 Deficiency of other specified B group vitamins: Secondary | ICD-10-CM | POA: Diagnosis not present

## 2021-05-28 DIAGNOSIS — D709 Neutropenia, unspecified: Secondary | ICD-10-CM | POA: Diagnosis not present

## 2021-05-28 DIAGNOSIS — E785 Hyperlipidemia, unspecified: Secondary | ICD-10-CM

## 2021-05-28 MED ORDER — VITAMIN C-ROSE HIPS 500 MG PO TABS: 500.0000 mg | ORAL_TABLET | Freq: Every day | ORAL | 0 refills | Status: AC

## 2021-05-28 MED ORDER — VITAMIN B-12 1000 MCG PO TABS: 1000.0000 ug | ORAL_TABLET | Freq: Every day | ORAL | 0 refills | Status: AC

## 2021-05-28 NOTE — Progress Notes (Signed)
This service is provided via telemedicine  No vital signs collected/recorded due to the encounter was a telemedicine visit.   Location of patient (ex: home, work):  Home.  Patient consents to a telephone visit:  Yes  Location of the provider (ex: office, home):  [employer].   Name of any referring provider:  Genifer Lazenby, Nelda Bucks, NP   Names of all persons participating in the telemedicine service and their role in the encounter:  Patient, [name], [place], [place], Webb Silversmith, NP.    Time spent on call: 8 minutes spent on the phone with Medical Assistant.      Provider: Marlowe Sax FNP-C  Heather Mckendree, Nelda Bucks, NP  Patient Care Team: Buddy Loeffelholz, Nelda Bucks, NP as PCP - General (Family Medicine) [name], MD as PCP - Cardiology (Cardiology) Marcial Pacas, MD as Consulting Physician (Neurology)  Extended Emergency Contact Information Primary Emergency Contact: [name] [place] Phone: [phone] Relation: Significant other Secondary Emergency Contact: [place] Phone: [phone] Relation: Son  Code Status:  Full Code  Goals of care: Advanced Directive information Advanced Directives 05/28/2021  Does Patient Have a Medical Advance Directive? No  Type of Advance Directive -  Would patient like information on creating a medical advance directive? No - Patient declined     Chief Complaint  Patient presents with   Medical Management of Chronic Issues    Discuss lab results.     HPI:  Pt is a 70 y.o. female seen today for an acute visit for discuss of recent labs. Blood work results discussed and reviewed with today as below. Electrolytes,kidney,liver  function,Thyroid level,Hemoglobin and glucose were all within normal range.   WBC was low at  3.4    Vitamin B 12 was high > 2000 has been taking 5000 and sometimes 10,000  Cholesterol 273,HDL 111,TRG 85,LDL 143 does not eat fried foods.Likes butter and  cheese. Discussed dietary modification and exercise.    Past Medical History:  Diagnosis Date   Anxiety and depression    Breast cancer ([place]) 2001   RIGHT SIDE. S/P CHEMOTHERAPY   COVID 11/29/2020   Headache    Mitral valve regurgitation    Osteoporosis    Past Surgical History:  Procedure Laterality Date   APPENDECTOMY     AT AGE 30 YEARS OLD   BREAST LUMPECTOMY Right 2001   AT AGE 89 YEARS OLD   CHOLECYSTECTOMY     GALLBLADDER SURGERY  2012    Allergies  Allergen Reactions   Codeine     RINGING IN THE EARS.    Other Other (See Comments)    PAIN KILLERS   Oxycodone Itching   Sulfa Antibiotics    Ibuprofen Rash   Penicillins Swelling and Rash    Outpatient Encounter Medications as of 05/28/2021  Medication Sig   Biotin 10 MG CAPS Take by mouth.   calcium citrate (CALCITRATE - DOSED IN MG ELEMENTAL CALCIUM) 950 (200 Ca) MG tablet Take 200 mg of elemental calcium by mouth daily.   cholecalciferol (VITAMIN D INFANT) 10 MCG/ML LIQD Take 400 Units by mouth daily.   citalopram (CELEXA) 20 MG tablet Take 20 mg by mouth daily.   Cyanocobalamin (VITAMIN B-12) 5000 MCG TBDP Take by mouth.   folic acid (FOLVITE) 545 MCG tablet Take 400 mcg by mouth daily.   ibuprofen (ADVIL) 200 MG tablet Take 400 mg by mouth daily as needed.   methocarbamol (ROBAXIN) 500 MG tablet Take 500 mg by mouth  daily as needed for muscle spasms.   Probiotic Product (PROBIOTIC-10 PO) Take by mouth.   traZODone (DESYREL) 50 MG tablet Take 50 mg by mouth at bedtime.   Wheat Dextrin (BENEFIBER DRINK MIX PO) Take 1 Package by mouth daily.   No facility-administered encounter medications on file as of 05/28/2021.    Review of Systems  Constitutional:  Negative for appetite change, chills, fatigue, fever and unexpected weight change.  Respiratory:  Negative for cough, chest tightness, shortness of breath and wheezing.   Cardiovascular:  Negative for chest pain, palpitations and leg swelling.   Endocrine: Negative for cold intolerance, heat intolerance, polydipsia, polyphagia and polyuria.  Genitourinary:  Negative for difficulty urinating, dysuria, flank pain and frequency.       Vaginal dryness   Psychiatric/Behavioral:  Negative for agitation, behavioral problems and confusion.    Immunization History  Administered Date(s) Administered   Influenza Split 03/09/2019   Influenza, High Dose Seasonal PF 03/10/2018, 04/04/2020   PFIZER(Purple Top)SARS-COV-2 Vaccination 07/31/2019, 08/29/2019, 05/13/2020   Pneumococcal Conjugate-13 12/19/2015   Pneumococcal Polysaccharide-23 03/29/2001, 03/09/2019   Td 03/09/2019   Tdap 03/14/2008, 03/09/2019   Zoster, Live 07/29/2012, 04/04/2020, 11/08/2020   Pertinent  Health Maintenance Due  Topic Date Due   COLONOSCOPY (Pts 45-84yr Insurance coverage will need to be confirmed)  Never done   INFLUENZA VACCINE  12/30/2020   MAMMOGRAM  01/04/2023   DEXA SCAN  Completed   Fall Risk 01/19/2020 06/01/2020 05/12/2021 05/27/2021 05/28/2021  Falls in the past year? - - 0 0 0  Was there an injury with Fall? - - 0 0 0  Fall Risk Category Calculator - - 0 0 0  Fall Risk Category - - Low Low Low  Patient Fall Risk Level Low fall risk Low fall risk Low fall risk Low fall risk Low fall risk  Patient at Risk for Falls Due to - - No Fall Risks No Fall Risks No Fall Risks  Fall risk Follow up - - Falls evaluation completed Falls evaluation completed Falls evaluation completed   Functional Status Survey:    There were no vitals filed for this visit. There is no height or weight on file to calculate BMI. Physical Exam Unable to complete on telephone visit   Labs reviewed: Recent Labs    06/02/20 0337 03/27/21 0915 05/20/21 1026  NA 136 136 137  K 3.7 4.2 4.0  CL 100 98 101  CO2 '23 24 29  ' GLUCOSE 84 85 85  BUN '16 11 11  ' CREATININE 0.67 0.71 0.64  CALCIUM 9.5 10.7* 10.0   Recent Labs    03/27/21 0915 05/20/21 1026  AST 28 23  ALT 18 16   ALKPHOS 58  --   BILITOT 0.5 0.7  PROT 6.7 6.8  ALBUMIN 5.0*  --    Recent Labs    05/20/21 1026  WBC 3.4*  NEUTROABS 1,836  HGB 13.1  HCT 38.9  MCV 88.6  PLT 219   Lab Results  Component Value Date   TSH 1.34 05/20/2021   No results found for: HGBA1C Lab Results  Component Value Date   CHOL 273 (H) 05/20/2021   HDL 111 05/20/2021   LDLCALC 143 (H) 05/20/2021   TRIG 85 05/20/2021   CHOLHDL 2.5 05/20/2021    Significant Diagnostic Results in last 30 days:  No results found.  Assessment/Plan 1. Hyperlipidemia LDL goal <100 LDL 143  Dietary modification and exercise at least 3 three times per week for 30 minutes advised. -  will recheck lipid panel in 6 months  - DASH eating information provided on AVS  2. Vitamin B12 deficiency Vitamin B 12 > 2000  Has been taking 5000 mcg to 10,000 mcg tablet advised to reduce to 1000 mcg tablet daily. Does eat meat etc which I think she is getting enough from her dietary intake.will recheck Vitamin B12 level in 6 months then adjust if needed.   3. Neutropenia, unspecified type (HCC) WBC 3.4 unclear etiology could be from previous chemo or Radiation.  -advised to take Vitamin C 500 mg tablet daily  - increase fruit intake in diet   Family/ staff Communication: Reviewed plan of care with patient verbalized understanding   Labs/tests ordered:  - CBC with Differential/Platelet - CMP with eGFR([facility]) - TSH - Lipid panel - Vitamin B 12  Next Appointment: 6 months for medical management of chronic issues.Fasting Labs prior to visit.   I connected with  Khandi Bruski on 05/28/21 by a Telephone enabled telemedicine application and verified that I am speaking with the correct person using two identifiers.   I discussed the limitations of evaluation and management by telemedicine. The patient expressed understanding and agreed to proceed.   Spent 12 minutes of non-face to face with patient  >50% time spent counseling;  reviewing medical record; tests; labs; and developing future plan of care.    Sandrea Hughs, NP

## 2021-05-28 NOTE — Telephone Encounter (Signed)
Called patient to discuss lab work but no answer.left Voicemail for patient to call back at office number [phone].

## 2021-05-28 NOTE — Telephone Encounter (Signed)
Patient labs discussed today with PCP [place], Nelda Bucks, NP via Telehealth Visit.

## 2021-05-28 NOTE — Patient Instructions (Signed)
- Decrease vitamin B 12 from 5000 - 10,000 mcg tablet to 1000 mcg tablet daily   - take Vitamin C 500 mg tablet once daily due to low white blood cells  - Dietary modification and exercise at least 3 times per week for 30 minutes advised.  DASH Eating Plan DASH stands for Dietary Approaches to Stop Hypertension. The DASH eating plan is a healthy eating plan that has been shown to: Reduce high blood pressure (hypertension). Reduce your risk for type 2 diabetes, heart disease, and stroke. Help with weight loss. What are tips for following this plan? Reading food labels Check food labels for the amount of salt (sodium) per serving. Choose foods with less than 5 percent of the Daily Value of sodium. Generally, foods with less than 300 milligrams (mg) of sodium per serving fit into this eating plan. To find whole grains, look for the word "whole" as the first word in the ingredient list. Shopping Buy products labeled as "low-sodium" or "no salt added." Buy fresh foods. Avoid canned foods and pre-made or frozen meals. Cooking Avoid adding salt when cooking. Use salt-free seasonings or herbs instead of table salt or sea salt. Check with your health care provider or pharmacist before using salt substitutes. Do not fry foods. Cook foods using healthy methods such as baking, boiling, grilling, roasting, and broiling instead. Cook with heart-healthy oils, such as olive, canola, avocado, soybean, or sunflower oil. Meal planning  Eat a balanced diet that includes: 4 or more servings of fruits and 4 or more servings of vegetables each day. Try to fill one-half of your plate with fruits and vegetables. 6-8 servings of whole grains each day. Less than 6 oz (170 g) of lean meat, poultry, or fish each day. A 3-oz (85-g) serving of meat is about the same size as a deck of cards. One egg equals 1 oz (28 g). 2-3 servings of low-fat dairy each day. One serving is 1 cup (237 mL). 1 serving of nuts, seeds, or  beans 5 times each week. 2-3 servings of heart-healthy fats. Healthy fats called omega-3 fatty acids are found in foods such as walnuts, flaxseeds, fortified milks, and eggs. These fats are also found in cold-water fish, such as sardines, salmon, and mackerel. Limit how much you eat of: Canned or prepackaged foods. Food that is high in trans fat, such as some fried foods. Food that is high in saturated fat, such as fatty meat. Desserts and other sweets, sugary drinks, and other foods with added sugar. Full-fat dairy products. Do not salt foods before eating. Do not eat more than 4 egg yolks a week. Try to eat at least 2 vegetarian meals a week. Eat more home-cooked food and less restaurant, buffet, and fast food. Lifestyle When eating at a restaurant, ask that your food be prepared with less salt or no salt, if possible. If you drink alcohol: Limit how much you use to: 0-1 drink a day for women who are not pregnant. 0-2 drinks a day for men. Be aware of how much alcohol is in your drink. In the [place] one drink equals one 12 oz bottle of beer (355 mL), one 5 oz glass of wine (148 mL), or one 1 oz glass of hard liquor (44 mL). General information Avoid eating more than 2,300 mg of salt a day. If you have hypertension, you may need to reduce your sodium intake to 1,500 mg a day. Work with your health care provider to maintain a healthy body  weight or to lose weight. Ask what an ideal weight is for you. Get at least 30 minutes of exercise that causes your heart to beat faster (aerobic exercise) most days of the week. Activities may include walking, swimming, or biking. Work with your health care provider or dietitian to adjust your eating plan to your individual calorie needs. What foods should I eat? Fruits All fresh, dried, or frozen fruit. Canned fruit in natural juice (without added sugar). Vegetables Fresh or frozen vegetables (raw, steamed, roasted, or grilled). Low-sodium or  reduced-sodium tomato and vegetable juice. Low-sodium or reduced-sodium tomato sauce and tomato paste. Low-sodium or reduced-sodium canned vegetables. Grains Whole-grain or whole-wheat bread. Whole-grain or whole-wheat pasta. Brown rice. [place]. Bulgur. Whole-grain and low-sodium cereals. Pita bread. Low-fat, low-sodium crackers. Whole-wheat flour tortillas. Meats and other proteins Skinless chicken or [place]. Ground chicken or [place]. Pork with fat trimmed off. Fish and seafood. Egg whites. Dried beans, peas, or lentils. Unsalted nuts, nut butters, and seeds. Unsalted canned beans. Lean cuts of beef with fat trimmed off. Low-sodium, lean precooked or cured meat, such as sausages or meat loaves. Dairy Low-fat (1%) or fat-free (skim) milk. Reduced-fat, low-fat, or fat-free cheeses. Nonfat, low-sodium ricotta or cottage cheese. Low-fat or nonfat yogurt. Low-fat, low-sodium cheese. Fats and oils Soft margarine without trans fats. Vegetable oil. Reduced-fat, low-fat, or light mayonnaise and salad dressings (reduced-sodium). Canola, safflower, olive, avocado, soybean, and sunflower oils. Avocado. Seasonings and condiments Herbs. Spices. Seasoning mixes without salt. Other foods Unsalted popcorn and pretzels. Fat-free sweets. The items listed above may not be a complete list of foods and beverages you can eat. Contact a dietitian for more information. What foods should I avoid? Fruits Canned fruit in a light or heavy syrup. Fried fruit. Fruit in cream or butter sauce. Vegetables Creamed or fried vegetables. Vegetables in a cheese sauce. Regular canned vegetables (not low-sodium or reduced-sodium). Regular canned tomato sauce and paste (not low-sodium or reduced-sodium). Regular tomato and vegetable juice (not low-sodium or reduced-sodium). Angie Fava. Olives. Grains Baked goods made with fat, such as croissants, muffins, or some breads. Dry pasta or rice meal packs. Meats and other  proteins Fatty cuts of meat. Ribs. Fried meat. Berniece Salines. Bologna, salami, and other precooked or cured meats, such as sausages or meat loaves. Fat from the back of a pig (fatback). Bratwurst. Salted nuts and seeds. Canned beans with added salt. Canned or smoked fish. Whole eggs or egg yolks. Chicken or [place] with skin. Dairy Whole or 2% milk, cream, and half-and-half. Whole or full-fat cream cheese. Whole-fat or sweetened yogurt. Full-fat cheese. Nondairy creamers. Whipped toppings. Processed cheese and cheese spreads. Fats and oils Butter. Stick margarine. Lard. Shortening. Ghee. Bacon fat. Tropical oils, such as coconut, palm kernel, or palm oil. Seasonings and condiments Onion salt, garlic salt, seasoned salt, table salt, and sea salt. Worcestershire sauce. Tartar sauce. Barbecue sauce. Teriyaki sauce. Soy sauce, including reduced-sodium. Steak sauce. Canned and packaged gravies. Fish sauce. Oyster sauce. Cocktail sauce. Store-bought horseradish. Ketchup. Mustard. Meat flavorings and tenderizers. Bouillon cubes. Hot sauces. Pre-made or packaged marinades. Pre-made or packaged taco seasonings. Relishes. Regular salad dressings. Other foods Salted popcorn and pretzels. The items listed above may not be a complete list of foods and beverages you should avoid. Contact a dietitian for more information. Where to find more information National Heart, Lung, and Blood Institute: https://wilson-eaton.com/ American Heart Association: www.heart.org Academy of Nutrition and Dietetics: www.eatright.[place]: www.kidney.org Summary The DASH eating plan is a healthy eating plan  that has been shown to reduce high blood pressure (hypertension). It may also reduce your risk for type 2 diabetes, heart disease, and stroke. When on the DASH eating plan, aim to eat more fresh fruits and vegetables, whole grains, lean proteins, low-fat dairy, and heart-healthy fats. With the DASH eating plan, you should  limit salt (sodium) intake to 2,300 mg a day. If you have hypertension, you may need to reduce your sodium intake to 1,500 mg a day. Work with your health care provider or dietitian to adjust your eating plan to your individual calorie needs. This information is not intended to replace advice given to you by your health care provider. Make sure you discuss any questions you have with your health care provider. Document Revised: 04/21/2019 Document Reviewed: 04/21/2019 Elsevier Patient Education  2022 [employer].

## 2021-05-28 NOTE — Telephone Encounter (Signed)
Patient added to schedule for 3:15pm today 05/28/2021.

## 2021-06-04 ENCOUNTER — Ambulatory Visit: Payer: Medicare Other

## 2021-06-11 ENCOUNTER — Ambulatory Visit: Payer: Medicare Other

## 2021-06-13 ENCOUNTER — Telehealth: Payer: Self-pay

## 2021-06-13 NOTE — Telephone Encounter (Signed)
Yes.please send excuse for [employer] duty.

## 2021-06-13 NOTE — Telephone Encounter (Signed)
Letter pended and waiting on patient to bring copy of jury duty order letter to office. Letter will be completed once patient brings needed information to office.

## 2021-06-13 NOTE — Telephone Encounter (Signed)
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 She stated she needed letter ASAP. We would need a physical copy of the letter, correct.  Please advise

## 2021-06-16 NOTE — Telephone Encounter (Addendum)
Patient cam into office on Friday 06/13/21 she provided the necessary information, letter completed and given to patient.

## 2021-06-18 ENCOUNTER — Ambulatory Visit: Payer: Medicare Other

## 2021-06-25 ENCOUNTER — Ambulatory Visit
Admission: RE | Admit: 2021-06-25 | Discharge: 2021-06-25 | Disposition: A | Discharge status: Home / Self Care | Payer: Medicare Other | Source: Ambulatory Visit | Attending: Family | Admitting: Family

## 2021-06-25 ENCOUNTER — Other Ambulatory Visit: Payer: Self-pay

## 2021-06-25 DIAGNOSIS — R922 Inconclusive mammogram: Secondary | ICD-10-CM | POA: Diagnosis not present

## 2021-06-25 DIAGNOSIS — Z853 Personal history of malignant neoplasm of breast: Secondary | ICD-10-CM | POA: Diagnosis not present

## 2021-06-25 DIAGNOSIS — N644 Mastodynia: Secondary | ICD-10-CM

## 2021-06-25 HISTORY — DX: Personal history of antineoplastic chemotherapy: Z92.21

## 2021-06-25 HISTORY — DX: Personal history of irradiation: Z92.3

## 2021-07-18 DIAGNOSIS — Z760 Encounter for issue of repeat prescription: Secondary | ICD-10-CM | POA: Diagnosis not present

## 2021-07-31 ENCOUNTER — Ambulatory Visit: Payer: Medicare Other | Admitting: Neurology

## 2021-10-01 DIAGNOSIS — M67442 Ganglion, left hand: Secondary | ICD-10-CM | POA: Diagnosis not present

## 2021-10-01 DIAGNOSIS — R2232 Localized swelling, mass and lump, left upper limb: Secondary | ICD-10-CM | POA: Diagnosis not present

## 2021-10-07 DIAGNOSIS — H5202 Hypermetropia, left eye: Secondary | ICD-10-CM | POA: Diagnosis not present

## 2021-10-07 DIAGNOSIS — G43B0 Ophthalmoplegic migraine, not intractable: Secondary | ICD-10-CM | POA: Diagnosis not present

## 2021-10-07 DIAGNOSIS — H2513 Age-related nuclear cataract, bilateral: Secondary | ICD-10-CM | POA: Diagnosis not present

## 2021-10-07 DIAGNOSIS — H43812 Vitreous degeneration, left eye: Secondary | ICD-10-CM | POA: Diagnosis not present

## 2021-10-07 DIAGNOSIS — H11043 Peripheral pterygium, stationary, bilateral: Secondary | ICD-10-CM | POA: Diagnosis not present

## 2021-10-07 DIAGNOSIS — H52223 Regular astigmatism, bilateral: Secondary | ICD-10-CM | POA: Diagnosis not present

## 2021-10-07 DIAGNOSIS — H524 Presbyopia: Secondary | ICD-10-CM | POA: Diagnosis not present

## 2021-10-17 ENCOUNTER — Other Ambulatory Visit: Payer: Self-pay | Admitting: *Deleted

## 2021-10-17 MED ORDER — CITALOPRAM HYDROBROMIDE 20 MG PO TABS: ORAL_TABLET | ORAL | 1 refills | Status: DC

## 2021-10-17 MED ORDER — TRAZODONE HCL 50 MG PO TABS: 50.0000 mg | ORAL_TABLET | Freq: Every day | ORAL | 1 refills | Status: DC

## 2021-10-17 NOTE — Telephone Encounter (Signed)
Medication list updated and Pended Rx and sent to [facility] for approval due to [place] Warning.

## 2021-10-17 NOTE — Telephone Encounter (Signed)
Also requested refill on Trazodone. Pended and sent to [facility] for approval due to [place].

## 2021-10-17 NOTE — Telephone Encounter (Signed)
May refill citalopram 20 mg tablet take one and a half tablet ( 30 mg) by mouth daily as requested.

## 2021-10-17 NOTE — Telephone Encounter (Addendum)
Received fax from Mariah Romero stating that patient is requesting a refill on her Citalopram '20mg'$ .   Patient states that she is taking 1 1/2 tablet a day and needs a refill of #135.   Current medication list Does not reflect this.  Please Advise.

## 2021-10-22 ENCOUNTER — Telehealth: Payer: Self-pay

## 2021-10-22 NOTE — Telephone Encounter (Signed)
patient  called and left  message on clinical intake for medication  dosage questions .no answer  when called back, will call again later. MLP

## 2021-11-04 ENCOUNTER — Other Ambulatory Visit: Payer: Medicare Other

## 2021-11-04 DIAGNOSIS — D709 Neutropenia, unspecified: Secondary | ICD-10-CM | POA: Diagnosis not present

## 2021-11-04 DIAGNOSIS — F321 Major depressive disorder, single episode, moderate: Secondary | ICD-10-CM

## 2021-11-04 DIAGNOSIS — E538 Deficiency of other specified B group vitamins: Secondary | ICD-10-CM

## 2021-11-04 DIAGNOSIS — E785 Hyperlipidemia, unspecified: Secondary | ICD-10-CM | POA: Diagnosis not present

## 2021-11-05 LAB — CBC WITH DIFFERENTIAL/PLATELET
Absolute Monocytes: 429 cells/uL (ref 200–950)
Basophils Absolute: 41 cells/uL (ref 0–200)
Basophils Relative: 1.4 %
Eosinophils Absolute: 90 cells/uL (ref 15–500)
Eosinophils Relative: 3.1 %
HCT: 39.2 % (ref 35.0–45.0)
Hemoglobin: 13.4 g/dL (ref 11.7–15.5)
Lymphs Abs: 977 cells/uL (ref 850–3900)
MCH: 29.6 pg (ref 27.0–33.0)
MCHC: 34.2 g/dL (ref 32.0–36.0)
MCV: 86.5 fL (ref 80.0–100.0)
MPV: 10.1 fL (ref 7.5–12.5)
Monocytes Relative: 14.8 %
Neutro Abs: 1363 cells/uL — ABNORMAL LOW (ref 1500–7800)
Neutrophils Relative %: 47 %
Platelets: 233 10*3/uL (ref 140–400)
RBC: 4.53 10*6/uL (ref 3.80–5.10)
RDW: 12.5 % (ref 11.0–15.0)
Total Lymphocyte: 33.7 %
WBC: 2.9 10*3/uL — ABNORMAL LOW (ref 3.8–10.8)

## 2021-11-05 LAB — COMPLETE METABOLIC PANEL WITH GFR
AG Ratio: 2.1 (calc) (ref 1.0–2.5)
ALT: 19 U/L (ref 6–29)
AST: 26 U/L (ref 10–35)
Albumin: 4.6 g/dL (ref 3.6–5.1)
Alkaline phosphatase (APISO): 48 U/L (ref 37–153)
BUN: 11 mg/dL (ref 7–25)
CO2: 30 mmol/L (ref 20–32)
Calcium: 10.7 mg/dL — ABNORMAL HIGH (ref 8.6–10.4)
Chloride: 99 mmol/L (ref 98–110)
Creat: 0.61 mg/dL (ref 0.60–1.00)
Globulin: 2.2 g/dL (calc) (ref 1.9–3.7)
Glucose, Bld: 105 mg/dL — ABNORMAL HIGH (ref 65–99)
Potassium: 4.2 mmol/L (ref 3.5–5.3)
Sodium: 136 mmol/L (ref 135–146)
Total Bilirubin: 0.9 mg/dL (ref 0.2–1.2)
Total Protein: 6.8 g/dL (ref 6.1–8.1)
eGFR: 96 mL/min/{1.73_m2} (ref >=60)

## 2021-11-05 LAB — VITAMIN B12: Vitamin B-12: 1347 pg/mL — ABNORMAL HIGH (ref 200–1100)

## 2021-11-05 LAB — TSH: TSH: 1.7 mIU/L (ref 0.40–4.50)

## 2021-11-05 LAB — LIPID PANEL
Cholesterol: 294 mg/dL — ABNORMAL HIGH (ref <200)
HDL: 118 mg/dL (ref >=50)
LDL Cholesterol (Calc): 160 mg/dL (calc) — ABNORMAL HIGH
Non-HDL Cholesterol (Calc): 176 mg/dL (calc) — ABNORMAL HIGH (ref <130)
Total CHOL/HDL Ratio: 2.5 (calc) (ref <5.0)
Triglycerides: 65 mg/dL (ref <150)

## 2021-11-07 ENCOUNTER — Ambulatory Visit: Payer: Medicare Other | Admitting: Adult Health

## 2021-11-07 ENCOUNTER — Encounter: Payer: Self-pay | Admitting: Adult Health

## 2021-11-07 VITALS — BP 140/80 | HR 87 | Temp 98.6°F | Resp 20 | Ht 64.0 in | Wt 129.2 lb

## 2021-11-07 DIAGNOSIS — E785 Hyperlipidemia, unspecified: Secondary | ICD-10-CM | POA: Diagnosis not present

## 2021-11-07 DIAGNOSIS — D709 Neutropenia, unspecified: Secondary | ICD-10-CM | POA: Diagnosis not present

## 2021-11-07 DIAGNOSIS — F321 Major depressive disorder, single episode, moderate: Secondary | ICD-10-CM

## 2021-11-07 DIAGNOSIS — F5101 Primary insomnia: Secondary | ICD-10-CM | POA: Diagnosis not present

## 2021-11-07 DIAGNOSIS — Z1211 Encounter for screening for malignant neoplasm of colon: Secondary | ICD-10-CM | POA: Diagnosis not present

## 2021-11-07 NOTE — Patient Instructions (Signed)
Neutropenia °Neutropenia is a condition that occurs when you have low levels of neutrophils. Neutrophils are a type of white blood cells. They are made in the spongy center of bones (bone marrow). They fight infections. °Neutrophils are your body's main defense against infections. The fewer neutrophils you have and the longer your body remains without them, the greater your risk of getting a severe infection. °What are the causes? °This condition can occur if your body uses up or destroys neutrophils faster than your bone marrow can make them. Neutropenia may be caused by: °A bacterial or fungal infection. °Allergic disorders. °Reactions to some medicines. °An autoimmune disease. °An enlarged spleen. °This condition can also occur if your bone marrow does not produce enough neutrophils. This problem may be caused by: °Cancer. °Cancer treatments, such as radiation or chemotherapy. °Viral infections. °Medicines, such as phenytoin. °Vitamin B12 deficiency. °Diseases of the bone marrow. °Environmental toxins, such as insecticides. °What are the signs or symptoms? °This condition does not usually cause symptoms. If symptoms are present, they are usually caused by an underlying infection. Symptoms of an infection may include: °Fever. °Chills. °Swollen glands. °Mouth ulcers. °Cough. °Rash or skin infection. Skin may be red, swollen, or painful. °Abdominal or rectal pain. °Frequent urination or pain or burning with urination. °Because neutropenia weakens the immune system, symptoms of infection may be reduced. It is important to be aware of any changes in your body and talk to your health care provider. °How is this diagnosed? °This condition is diagnosed based on your medical history and a physical exam. Tests will also be done, such as: °A complete blood count (CBC). °Bone marrow biopsy. This is collecting a sample of bone marrow for testing. °A chest X-ray. °A urine culture. °A blood culture. °How is this  treated? °Treatment depends on the underlying cause and severity of your condition. Mild neutropenia may not require treatment. Treatment may include medicines, such as: °Antibiotic medicine given through an IV. °Antiviral medicines. °Antifungal medicines. °A medicine to increase production of neutrophils (colony-stimulating factor). You may get this medicine through an IV or by injection. °Steroids given through an IV. °If an underlying condition is causing neutropenia, you may need treatment for that condition. If medicines or cancer treatments are causing neutropenia, your health care provider may have you stop the medicines or treatment. °Follow these instructions at home: °Medicines ° °Take over-the-counter and prescription medicines only as told by your health care provider. °Get an annual flu shot. Ask your health care provider whether you or anyone you live with needs any other vaccines. °Eating and drinking °Do not share food utensils. °Do not eat unpasteurized foods. °Do not eat raw or undercooked meat, eggs, or seafood. °Do not eat unwashed, raw fruits or vegetables. °Lifestyle °Avoid exposure to groups of people or children. °Avoid being around people who are sick. °Avoid being around live plants or fresh flowers. °Avoid being around dirt or dust, such as in construction areas or gardens. Wear gloves if you are going to do yard work or gardening. °Do not provide direct care for pets. Avoid animal droppings. Do not clean litter boxes and bird cages. °Do not have sex unless your health care provider has approved. °Hygiene ° °Bathe daily. °Clean the area between the genitals and the anus (perineal area) after you urinate or have a bowel movement. If you are female, wipe from front to back. °Get regular dental care and brush your teeth with a soft toothbrush before and after meals. °Do not use   a regular razor. Use an electric razor to remove hair. Wash your hands often with soap and water for at least 20  seconds. Make sure others who come in contact with you also wash their hands. If soap and water are not available, use hand sanitizer. General instructions Take steps to reduce your risk of injury or infection. Follow any precautions as told by your health care provider. Take actions to avoid cuts and burns. For example: Be cautious when you use knives. Always cut away from yourself. Keep knives in protective sheaths or guards when not in use. Use oven mitts when you cook with a hot stove, oven, or grill. Stand a safe distance away from open fires. Do not use tampons, enemas, or rectal suppositories unless your health care provider has approved. Keep all follow-up visits. This is important. Contact a health care provider if: You have a cough. You have a sore throat. You develop sores in your mouth or anus. You have a warm, red, or tender area on your skin. You have red streaks on the skin. You develop a rash. You have swollen lymph nodes. You have frequent or painful urination. You have vaginal discharge or itching. Get help right away if: You have a fever. You have chills or shaking. You have nausea or vomiting. You have a lot of fatigue. You have shortness of breath. Summary Neutropenia is a condition that occurs when you have a lower-than-normal level of a type of white blood cell (neutrophils) in your body. This condition can occur if your body uses up or destroys neutrophils faster than your bone marrow can make them. Treatment depends on the underlying cause and severity of your condition. Mild neutropenia may not require treatment. Follow any precautions as told by your health care provider to reduce your risk for injury or infection. This information is not intended to replace advice given to you by your health care provider. Make sure you discuss any questions you have with your health care provider. Document Revised: 11/13/2020 Document Reviewed: 11/13/2020 Elsevier Patient  Education  [place].  High Cholesterol  High cholesterol is a condition in which the blood has high levels of a white, waxy substance similar to fat (cholesterol). The liver makes all the cholesterol that the body needs. The human body needs small amounts of cholesterol to help build cells. A person gets extra or excess cholesterol from the food that he or she eats. The blood carries cholesterol from the liver to the rest of the body. If you have high cholesterol, deposits (plaques) may build up on the walls of your arteries. Arteries are the blood vessels that carry blood away from your heart. These plaques make the arteries narrow and stiff. Cholesterol plaques increase your risk for heart attack and stroke. Work with your health care provider to keep your cholesterol levels in a healthy range. What increases the risk? The following factors may make you more likely to develop this condition: Eating foods that are high in animal fat (saturated fat) or cholesterol. Being overweight. Not getting enough exercise. A family history of high cholesterol (familial hypercholesterolemia). Use of tobacco products. Having diabetes. What are the signs or symptoms? In most cases, high cholesterol does not usually cause any symptoms. In severe cases, very high cholesterol levels can cause: Fatty bumps under the skin (xanthomas). A white or gray ring around the black center (pupil) of the eye. How is this diagnosed? This condition may be diagnosed based on the results of  a blood test. If you are older than 70 years of age, your health care provider may check your cholesterol levels every 4-6 years. You may be checked more often if you have high cholesterol or other risk factors for heart disease. The blood test for cholesterol measures: "Bad" cholesterol, or LDL cholesterol. This is the main type of cholesterol that causes heart disease. The desired level is less than 100 mg/dL (2.59  mmol/L). "Good" cholesterol, or HDL cholesterol. HDL helps protect against heart disease by cleaning the arteries and carrying the LDL to the liver for processing. The desired level for HDL is 60 mg/dL (1.55 mmol/L) or higher. Triglycerides. These are fats that your body can store or burn for energy. The desired level is less than 150 mg/dL (1.69 mmol/L). Total cholesterol. This measures the total amount of cholesterol in your blood and includes LDL, HDL, and triglycerides. The desired level is less than 200 mg/dL (5.17 mmol/L). How is this treated? Treatment for high cholesterol starts with lifestyle changes, such as diet and exercise. Diet changes. You may be asked to eat foods that have more fiber and less saturated fats or added sugar. Lifestyle changes. These may include regular exercise, maintaining a healthy weight, and quitting use of tobacco products. Medicines. These are given when diet and lifestyle changes have not worked. You may be prescribed a statin medicine to help lower your cholesterol levels. Follow these instructions at home: Eating and drinking  Eat a healthy, balanced diet. This diet includes: Daily servings of a variety of fresh, frozen, or canned fruits and vegetables. Daily servings of whole grain foods that are rich in fiber. Foods that are low in saturated fats and trans fats. These include poultry and fish without skin, lean cuts of meat, and low-fat dairy products. A variety of fish, especially oily fish that contain omega-3 fatty acids. Aim to eat fish at least 2 times a week. Avoid foods and drinks that have added sugar. Use healthy cooking methods, such as roasting, grilling, broiling, baking, poaching, steaming, and stir-frying. Do not fry your food except for stir-frying. If you drink alcohol: Limit how much you have to: 0-1 drink a day for women who are not pregnant. 0-2 drinks a day for men. Know how much alcohol is in a drink. In the [place] one drink equals  one 12 oz bottle of beer (355 mL), one 5 oz glass of wine (148 mL), or one 1 oz glass of hard liquor (44 mL). Lifestyle  Get regular exercise. Aim to exercise for a total of 150 minutes a week. Increase your activity level by doing activities such as gardening, walking, and taking the stairs. Do not use any products that contain nicotine or tobacco. These products include cigarettes, chewing tobacco, and vaping devices, such as e-cigarettes. If you need help quitting, ask your health care provider. General instructions Take over-the-counter and prescription medicines only as told by your health care provider. Keep all follow-up visits. This is important. Where to find more information American Heart Association: www.heart.org National Heart, Lung, and Blood Institute: https://wilson-eaton.com/ Contact a health care provider if: You have trouble achieving or maintaining a healthy diet or weight. You are starting an exercise program. You are unable to stop smoking. Get help right away if: You have chest pain. You have trouble breathing. You have discomfort or pain in your jaw, neck, back, shoulder, or arm. You have any symptoms of a stroke. "BE FAST" is an easy way to remember the main warning  signs of a stroke: B - Balance. Signs are dizziness, sudden trouble walking, or loss of balance. E - Eyes. Signs are trouble seeing or a sudden change in vision. F - Face. Signs are sudden weakness or numbness of the face, or the face or eyelid drooping on one side. A - Arms. Signs are weakness or numbness in an arm. This happens suddenly and usually on one side of the body. S - Speech. Signs are sudden trouble speaking, slurred speech, or trouble understanding what people say. T - Time. Time to call emergency services. Write down what time symptoms started. You have other signs of a stroke, such as: A sudden, severe headache with no known cause. Nausea or vomiting. Seizure. These symptoms may represent a  serious problem that is an emergency. Do not wait to see if the symptoms will go away. Get medical help right away. Call your local emergency services (911 in the [place] Do not drive yourself to the hospital. Summary Cholesterol plaques increase your risk for heart attack and stroke. Work with your health care provider to keep your cholesterol levels in a healthy range. Eat a healthy, balanced diet, get regular exercise, and maintain a healthy weight. Do not use any products that contain nicotine or tobacco. These products include cigarettes, chewing tobacco, and vaping devices, such as e-cigarettes. Get help right away if you have any symptoms of a stroke. This information is not intended to replace advice given to you by your health care provider. Make sure you discuss any questions you have with your health care provider. Document Revised: 08/01/2020 Document Reviewed: 07/22/2020 Elsevier Patient Education  [place].

## 2021-11-07 NOTE — Progress Notes (Signed)
Location:  [facility]  Provider:  Durenda Age DNP  Code Status:  Full Code  Goals of Care:     11/07/2021    8:57 AM  Advanced Directives  Does Patient Have a Medical Advance Directive? No  Would patient like information on creating a medical advance directive? No - Patient declined     Chief Complaint  Patient presents with   Medical Management of Chronic Issues    Patient is here for a follow up for chronic conditions, discuss need for colonoscopy     HPI: Patient is a 71 y.o. female seen today for medical management of chronic diseases.  Hyperlipidemia LDL goal <100 -   cholesterol 294, up from 273; LDL 160, up from 143 TRG  65, down from 85 -   said that she does exercise regularly  Neutropenia, unspecified type (HCC) -  wbc 2.9, down from 3.4; denies having fever nor chills   Primary insomnia  -  takes Trazodone 50 mg at bedtime  Current moderate episode of major depressive disorder, unspecified whether recurrent (HCC) - has been going through a lot with sone, takes Celexa 20 mg daily  Colon cancer screening   stated that she had colonoscopy within the past year (went through [employer])   Past Medical History:  Diagnosis Date   Anxiety and depression    Breast cancer ([place]) 2001   RIGHT SIDE. S/P CHEMOTHERAPY   COVID 11/29/2020   Headache    Mitral valve regurgitation    Osteoporosis    Personal history of chemotherapy    Personal history of radiation therapy     Past Surgical History:  Procedure Laterality Date   APPENDECTOMY     AT AGE 47 YEARS OLD   BREAST LUMPECTOMY Right 2001   AT AGE 31 YEARS OLD   CHOLECYSTECTOMY     GALLBLADDER SURGERY  2012    Allergies  Allergen Reactions   Codeine     RINGING IN THE EARS.    Other Other (See Comments)    PAIN KILLERS   Oxycodone Itching   Sulfa Antibiotics Other (See Comments)   Ibuprofen Rash   Penicillins Swelling and Rash    Outpatient Encounter Medications as of 11/07/2021   Medication Sig   Biotin 10 MG CAPS Take by mouth.   calcium citrate (CALCITRATE - DOSED IN MG ELEMENTAL CALCIUM) 950 (200 Ca) MG tablet Take 200 mg of elemental calcium by mouth daily.   cholecalciferol (VITAMIN D INFANT) 10 MCG/ML LIQD Take 400 Units by mouth daily.   citalopram (CELEXA) 20 MG tablet Take one and a half tablet by mouth once daily.   folic acid (FOLVITE) 619 MCG tablet Take 400 mcg by mouth daily.   ibuprofen (ADVIL) 200 MG tablet Take 400 mg by mouth daily as needed.   methocarbamol (ROBAXIN) 500 MG tablet Take 500 mg by mouth daily as needed for muscle spasms.   Probiotic Product (PROBIOTIC-10 PO) Take by mouth.   traZODone (DESYREL) 50 MG tablet Take 1 tablet (50 mg total) by mouth at bedtime.   Wheat Dextrin (BENEFIBER DRINK MIX PO) Take 1 Package by mouth daily.   No facility-administered encounter medications on file as of 11/07/2021.    Review of Systems:  Review of Systems  Constitutional:  Negative for appetite change, chills, fatigue and fever.  HENT:  Negative for congestion, hearing loss, rhinorrhea and sore throat.   Eyes: Negative.   Respiratory:  Negative for cough, shortness of breath and  wheezing.   Cardiovascular:  Negative for chest pain, palpitations and leg swelling.  Gastrointestinal:  Negative for abdominal pain, constipation, diarrhea, nausea and vomiting.  Genitourinary:  Negative for dysuria.  Musculoskeletal:  Negative for arthralgias, back pain and myalgias.  Skin:  Negative for color change, rash and wound.  Neurological:  Negative for dizziness, weakness and headaches.  Psychiatric/Behavioral:  Positive for sleep disturbance. Negative for behavioral problems. The patient is not nervous/anxious.     Health Maintenance  Topic Date Due   COLONOSCOPY (Pts 45-22yr Insurance coverage will need to be confirmed)  Never done   COVID-19 Vaccine (4 - Pfizer series) 07/08/2020   INFLUENZA VACCINE  12/30/2021   MAMMOGRAM  01/04/2023    TETANUS/TDAP  03/08/2029   Pneumonia Vaccine 70 Years old  Completed   DEXA SCAN  Completed   Zoster Vaccines- Shingrix  Completed   HPV VACCINES  Aged Out   Hepatitis C Screening  Discontinued    Physical Exam: Vitals:   11/07/21 1408  BP: 140/80  Pulse: 87  Resp: 20  Temp: 98.6 F (37 C)  TempSrc: Temporal  SpO2: 98%  Weight: 129 lb 3.2 oz (58.6 kg)  Height: _0  (1.626 m)   Body mass index is 22.18 kg/m. Physical Exam Constitutional:      Appearance: Normal appearance.  HENT:     Head: Normocephalic and atraumatic.     Nose: Nose normal.     Mouth/Throat:     Mouth: Mucous membranes are moist.  Eyes:     Conjunctiva/sclera: Conjunctivae normal.  Cardiovascular:     Rate and Rhythm: Normal rate and regular rhythm.  Pulmonary:     Effort: Pulmonary effort is normal.     Breath sounds: Normal breath sounds.  Abdominal:     General: Bowel sounds are normal.     Palpations: Abdomen is soft.  Musculoskeletal:        General: Normal range of motion.     Cervical back: Normal range of motion.  Skin:    General: Skin is warm and dry.  Neurological:     General: No focal deficit present.     Mental Status: She is alert and oriented to person, place, and time.  Psychiatric:        Mood and Affect: Mood normal.        Behavior: Behavior normal.        Thought Content: Thought content normal.        Judgment: Judgment normal.     Labs reviewed: Basic Metabolic Panel: Recent Labs    03/27/21 0915 05/20/21 1026 11/04/21 1112  NA 136 137 136  K 4.2 4.0 4.2  CL 98 101 99  CO2 _1 GLUCOSE 85 85 105*  BUN _2 CREATININE 0.71 0.64 0.61  CALCIUM 10.7* 10.0 10.7*  TSH 1.400 1.34 1.70   Liver Function Tests: Recent Labs    03/27/21 0915 05/20/21 1026 11/04/21 1112  AST _3 ALT _4 ALKPHOS 58  --   --   BILITOT 0.5 0.7 0.9  PROT 6.7 6.8 6.8  ALBUMIN 5.0*  --   --    No results for input(s): "LIPASE", "AMYLASE" in the last  8760 hours. No results for input(s): "AMMONIA" in the last 8760 hours. CBC: Recent Labs    05/20/21 1026 11/04/21 1112  WBC 3.4* 2.9*  NEUTROABS 1,836 1,363*  HGB 13.1 13.4  HCT 38.9 39.2  MCV 88.6  86.5  PLT 219 233   Lipid Panel: Recent Labs    05/20/21 1026 11/04/21 1112  CHOL 273* 294*  HDL 111 118  LDLCALC 143* 160*  TRIG 85 65  CHOLHDL 2.5 2.5   No results found for: "HGBA1C"  Procedures since last visit: No results found.  Assessment/Plan  1. Hyperlipidemia LDL goal <100 Lab Results  Component Value Date   CHOL 294 (H) 11/04/2021   HDL 118 11/04/2021   LDLCALC 160 (H) 11/04/2021   TRIG 65 11/04/2021   CHOLHDL 2.5 11/04/2021   -  discussed increasing vegetable intake and to minimize meat intake -  plan to repeat lipid panel in 3 months  2. Neutropenia, unspecified type [facility]) Lab Results  Component Value Date   WBC 2.9 (L) 11/04/2021   -  stated that she just had a diagnostic mammogram of right breast and was told that results were normal and will need to repeat mammogram in a year - CBC with Differential/Platelets; Future  3. Primary insomnia -  continue Trazodone  4. Current moderate episode of major depressive disorder, unspecified whether recurrent ([place]) -  has been going through life issues with son  -   continue Celexa - CMP with eGFR([facility]); Future - Hemoglobin A1c; Future  5. Colon cancer screening -  stated that she had colonoscopy within the past year Education [employer], community) -  will bring a copy on her next visit  Labs/tests ordered:  CBC with differentials, CMP and A1C in a month  Next appt:  follow up in 1 month and PRN

## 2021-12-04 ENCOUNTER — Other Ambulatory Visit: Payer: Medicare Other

## 2021-12-09 ENCOUNTER — Other Ambulatory Visit: Payer: Medicare Other

## 2021-12-09 ENCOUNTER — Ambulatory Visit: Payer: Medicare Other | Admitting: Family

## 2021-12-09 ENCOUNTER — Other Ambulatory Visit: Payer: Medicare Other | Admitting: Family

## 2021-12-09 DIAGNOSIS — F321 Major depressive disorder, single episode, moderate: Secondary | ICD-10-CM

## 2021-12-09 DIAGNOSIS — E559 Vitamin D deficiency, unspecified: Secondary | ICD-10-CM | POA: Diagnosis not present

## 2021-12-09 DIAGNOSIS — D709 Neutropenia, unspecified: Secondary | ICD-10-CM

## 2021-12-09 DIAGNOSIS — R739 Hyperglycemia, unspecified: Secondary | ICD-10-CM | POA: Diagnosis not present

## 2021-12-09 NOTE — Progress Notes (Signed)
Calcium 11 (up from 10.7) and wbc 3.3 ( still low but improved from 2.9). Will need PTH and 25-hydroxyvitamin D level to further assess.

## 2021-12-11 ENCOUNTER — Other Ambulatory Visit: Payer: Medicare Other

## 2021-12-13 DIAGNOSIS — K0889 Other specified disorders of teeth and supporting structures: Secondary | ICD-10-CM | POA: Diagnosis not present

## 2021-12-14 LAB — COMPLETE METABOLIC PANEL WITH GFR
AG Ratio: 2.1 (calc) (ref 1.0–2.5)
ALT: 17 U/L (ref 6–29)
AST: 21 U/L (ref 10–35)
Albumin: 4.4 g/dL (ref 3.6–5.1)
Alkaline phosphatase (APISO): 45 U/L (ref 37–153)
BUN: 11 mg/dL (ref 7–25)
CO2: 30 mmol/L (ref 20–32)
Calcium: 11 mg/dL — ABNORMAL HIGH (ref 8.6–10.4)
Chloride: 102 mmol/L (ref 98–110)
Creat: 0.7 mg/dL (ref 0.60–1.00)
Globulin: 2.1 g/dL (calc) (ref 1.9–3.7)
Glucose, Bld: 98 mg/dL (ref 65–99)
Potassium: 4.1 mmol/L (ref 3.5–5.3)
Sodium: 137 mmol/L (ref 135–146)
Total Bilirubin: 0.8 mg/dL (ref 0.2–1.2)
Total Protein: 6.5 g/dL (ref 6.1–8.1)
eGFR: 93 mL/min/{1.73_m2} (ref >=60)

## 2021-12-14 LAB — TEST AUTHORIZATION 2

## 2021-12-14 LAB — CBC WITH DIFFERENTIAL/PLATELET
Absolute Monocytes: 472 cells/uL (ref 200–950)
Basophils Absolute: 40 cells/uL (ref 0–200)
Basophils Relative: 1.2 %
Eosinophils Absolute: 69 cells/uL (ref 15–500)
Eosinophils Relative: 2.1 %
HCT: 37.8 % (ref 35.0–45.0)
Hemoglobin: 12.8 g/dL (ref 11.7–15.5)
Lymphs Abs: 782 cells/uL — ABNORMAL LOW (ref 850–3900)
MCH: 30.3 pg (ref 27.0–33.0)
MCHC: 33.9 g/dL (ref 32.0–36.0)
MCV: 89.6 fL (ref 80.0–100.0)
MPV: 10.3 fL (ref 7.5–12.5)
Monocytes Relative: 14.3 %
Neutro Abs: 1937 cells/uL (ref 1500–7800)
Neutrophils Relative %: 58.7 %
Platelets: 210 10*3/uL (ref 140–400)
RBC: 4.22 10*6/uL (ref 3.80–5.10)
RDW: 12.9 % (ref 11.0–15.0)
Total Lymphocyte: 23.7 %
WBC: 3.3 10*3/uL — ABNORMAL LOW (ref 3.8–10.8)

## 2021-12-14 LAB — PTH, INTACT AND CALCIUM
Calcium: 11.1 mg/dL — ABNORMAL HIGH (ref 8.6–10.4)
PTH: 68 pg/mL (ref 16–77)

## 2021-12-14 LAB — HEMOGLOBIN A1C
Hgb A1c MFr Bld: 5.2 % of total Hgb (ref <5.7)
Mean Plasma Glucose: 103 mg/dL
eAG (mmol/L): 5.7 mmol/L

## 2021-12-14 LAB — VITAMIN D 25 HYDROXY (VIT D DEFICIENCY, FRACTURES): Vit D, 25-Hydroxy: 28 ng/mL — ABNORMAL LOW (ref 30–100)

## 2021-12-16 ENCOUNTER — Ambulatory Visit: Payer: Medicare Other | Admitting: Family

## 2021-12-22 ENCOUNTER — Encounter: Payer: Self-pay | Admitting: Family

## 2021-12-22 ENCOUNTER — Ambulatory Visit (INDEPENDENT_AMBULATORY_CARE_PROVIDER_SITE_OTHER): Payer: Medicare Other | Admitting: Family

## 2021-12-22 ENCOUNTER — Ambulatory Visit
Admission: RE | Admit: 2021-12-22 | Discharge: 2021-12-22 | Disposition: A | Discharge status: Home / Self Care | Payer: Medicare Other | Source: Ambulatory Visit | Attending: Family | Admitting: Family

## 2021-12-22 VITALS — BP 110/60 | HR 75 | Temp 97.5°F | Resp 16 | Ht 64.0 in | Wt 128.2 lb

## 2021-12-22 DIAGNOSIS — K5730 Diverticulosis of large intestine without perforation or abscess without bleeding: Secondary | ICD-10-CM | POA: Diagnosis not present

## 2021-12-22 DIAGNOSIS — R1030 Lower abdominal pain, unspecified: Secondary | ICD-10-CM

## 2021-12-22 DIAGNOSIS — R1032 Left lower quadrant pain: Secondary | ICD-10-CM | POA: Diagnosis not present

## 2021-12-22 MED ORDER — METRONIDAZOLE 500 MG PO TABS: 500.0000 mg | ORAL_TABLET | Freq: Three times a day (TID) | ORAL | 0 refills | Status: AC

## 2021-12-22 MED ORDER — CIPROFLOXACIN HCL 500 MG PO TABS: 500.0000 mg | ORAL_TABLET | Freq: Two times a day (BID) | ORAL | 0 refills | Status: AC

## 2021-12-22 NOTE — Progress Notes (Signed)
Provider: Randle Shatzer FNP-C  Lauree Chandler, NP  Patient Care Team: Lauree Chandler, NP as PCP - General (Geriatric Medicine) [name], MD as PCP - Cardiology (Cardiology) Marcial Pacas, MD as Consulting Physician (Neurology)  Extended Emergency Contact Information Primary Emergency Contact: [name] [place] Phone: [phone] Relation: Significant other Secondary Emergency Contact: [place] Phone: [phone] Relation: Son  Code Status: Full Code  Goals of care: Advanced Directive information    12/22/2021    1:22 PM  Advanced Directives  Does Patient Have a Medical Advance Directive? No  Would patient like information on creating a medical advance directive? No - Patient declined     Chief Complaint  Patient presents with   Acute Visit    Patient complains of possible diverticulitis.     HPI:  Pt is a 71 y.o. female seen today for an acute visit for evaluation of abdominal pain since last Thursday, 12/11/2021. She was seen at the urgent care on Saturday for dental infection was treated with amoxicillin. Her abdominal pain worsen was not able to walk straight without holding the abdomen.This past Saturday she applied heat which slightly helped but symptoms persist.Thinks has diverticulitis states CT scan in the past showed diverticulosis. she denies any fever,chills,nausea,vomiting,flank pain,urgency,frequency,dysuria,difficult urination or hematuria.  Past Medical History:  Diagnosis Date   Anxiety and depression    Breast cancer ([place]) 2001   RIGHT SIDE. S/P CHEMOTHERAPY   COVID 11/29/2020   Headache    Mitral valve regurgitation    Osteoporosis    Personal history of chemotherapy    Personal history of radiation therapy    Past Surgical History:  Procedure Laterality Date   APPENDECTOMY     AT AGE 21 YEARS OLD   BREAST LUMPECTOMY Right 2001   AT AGE 43 YEARS OLD   CHOLECYSTECTOMY      GALLBLADDER SURGERY  2012    Allergies  Allergen Reactions   Codeine     RINGING IN THE EARS.    Other Other (See Comments)    PAIN KILLERS   Oxycodone Itching   Sulfa Antibiotics Other (See Comments)   Ibuprofen Rash   Penicillins Swelling and Rash    Outpatient Encounter Medications as of 12/22/2021  Medication Sig   Biotin 10 MG CAPS Take by mouth.   calcium citrate (CALCITRATE - DOSED IN MG ELEMENTAL CALCIUM) 950 (200 Ca) MG tablet Take 200 mg of elemental calcium by mouth daily.   cholecalciferol (VITAMIN D INFANT) 10 MCG/ML LIQD Take 400 Units by mouth daily.   citalopram (CELEXA) 20 MG tablet Take one and a half tablet by mouth once daily.   folic acid (FOLVITE) 417 MCG tablet Take 400 mcg by mouth daily.   ibuprofen (ADVIL) 200 MG tablet Take 400 mg by mouth daily as needed.   methocarbamol (ROBAXIN) 500 MG tablet Take 500 mg by mouth daily as needed for muscle spasms.   Probiotic Product (PROBIOTIC-10 PO) Take by mouth.   traZODone (DESYREL) 50 MG tablet Take 1 tablet (50 mg total) by mouth at bedtime.   Wheat Dextrin (BENEFIBER DRINK MIX PO) Take 1 Package by mouth daily.   No facility-administered encounter medications on file as of 12/22/2021.    Review of Systems  Constitutional:  Negative for appetite change, chills, fatigue, fever and unexpected weight change.  Eyes:  Negative for pain, discharge, redness, itching and visual disturbance.  Respiratory:  Negative for cough, chest tightness, shortness of breath and  wheezing.   Cardiovascular:  Negative for chest pain, palpitations and leg swelling.  Gastrointestinal:  Positive for abdominal pain. Negative for abdominal distention, blood in stool, constipation, diarrhea, nausea and vomiting.  Genitourinary:  Negative for difficulty urinating, dysuria, flank pain, frequency and urgency.  Skin:  Negative for color change, pallor and rash.    Immunization History  Administered Date(s) Administered   Influenza Split  03/09/2019   Influenza, High Dose Seasonal PF 03/10/2018, 04/04/2020   PFIZER(Purple Top)SARS-COV-2 Vaccination 07/31/2019, 08/29/2019, 05/13/2020   Pneumococcal Conjugate-13 12/19/2015   Pneumococcal Polysaccharide-23 03/29/2001, 03/09/2019   Td 03/09/2019   Tdap 03/14/2008, 03/09/2019   Zoster Recombinat (Shingrix) 04/04/2020, 11/08/2020   Zoster, Live 07/29/2012   Pertinent  Health Maintenance Due  Topic Date Due   COLONOSCOPY (Pts 45-53yr Insurance coverage will need to be confirmed)  Never done   INFLUENZA VACCINE  12/30/2021   MAMMOGRAM  01/04/2023   DEXA SCAN  Completed      06/01/2020    6:32 PM 05/12/2021    1:45 PM 05/27/2021    2:36 PM 05/28/2021    3:33 PM 12/22/2021    1:21 PM  FGlen Ridgein the past year?  0 0 0 0  Was there an injury with Fall?  0 0 0 0  Fall Risk Category Calculator  0 0 0 0  Fall Risk Category  Low Low Low Low  Patient Fall Risk Level Low fall risk Low fall risk Low fall risk Low fall risk Low fall risk  Patient at Risk for Falls Due to  No Fall Risks No Fall Risks No Fall Risks No Fall Risks  Fall risk Follow up  Falls evaluation completed Falls evaluation completed Falls evaluation completed Falls evaluation completed   Functional Status Survey:    Vitals:   12/22/21 1316  BP: 110/60  Pulse: 75  Resp: 16  Temp: (!) 97.5 F (36.4 C)  SpO2: 98%  Weight: 128 lb 3.2 oz (58.2 kg)  Height: '5\' 4"'  (1.626 m)   Body mass index is 22.01 kg/m. Physical Exam Vitals reviewed.  Constitutional:      General: She is not in acute distress.    Appearance: Normal appearance. She is normal weight. She is not ill-appearing or diaphoretic.  HENT:     Head: Normocephalic.     Mouth/Throat:     Mouth: Mucous membranes are moist.     Pharynx: Oropharynx is clear. No oropharyngeal exudate or posterior oropharyngeal erythema.  Eyes:     General: No scleral icterus.       Right eye: No discharge.        Left eye: No discharge.      Conjunctiva/sclera: Conjunctivae normal.     Pupils: Pupils are equal, round, and reactive to light.  Cardiovascular:     Rate and Rhythm: Normal rate and regular rhythm.     Pulses: Normal pulses.     Heart sounds: Normal heart sounds. No murmur heard.    No friction rub. No gallop.  Pulmonary:     Effort: Pulmonary effort is normal. No respiratory distress.     Breath sounds: Normal breath sounds. No wheezing, rhonchi or rales.  Chest:     Chest wall: No tenderness.  Abdominal:     General: Bowel sounds are normal. There is no distension.     Palpations: Abdomen is soft. There is no mass.     Tenderness: There is abdominal tenderness in the left upper quadrant and left  lower quadrant. There is no right CVA tenderness, left CVA tenderness, guarding or rebound.  Musculoskeletal:        General: No swelling or tenderness. Normal range of motion.     Right lower leg: No edema.     Left lower leg: No edema.  Skin:    General: Skin is warm and dry.     Coloration: Skin is not pale.     Findings: No erythema or rash.  Neurological:     Mental Status: She is alert and oriented to person, place, and time.     Sensory: No sensory deficit.     Motor: No weakness.     Gait: Gait normal.  Psychiatric:        Mood and Affect: Mood normal.        Speech: Speech normal.        Behavior: Behavior normal.     Labs reviewed: Recent Labs    05/20/21 1026 11/04/21 1112 12/09/21 1046  NA 137 136 137  K 4.0 4.2 4.1  CL 101 99 102  CO2 '29 30 30  ' GLUCOSE 85 105* 98  BUN '11 11 11  ' CREATININE 0.64 0.61 0.70  CALCIUM 10.0 10.7* 11.1*  11.0*   Recent Labs    03/27/21 0915 05/20/21 1026 11/04/21 1112 12/09/21 1046  AST '28 23 26 21  ' ALT '18 16 19 17  ' ALKPHOS 58  --   --   --   BILITOT 0.5 0.7 0.9 0.8  PROT 6.7 6.8 6.8 6.5  ALBUMIN 5.0*  --   --   --    Recent Labs    05/20/21 1026 11/04/21 1112 12/09/21 1046  WBC 3.4* 2.9* 3.3*  NEUTROABS 1,836 1,363* 1,937  HGB 13.1 13.4  12.8  HCT 38.9 39.2 37.8  MCV 88.6 86.5 89.6  PLT 219 233 210   Lab Results  Component Value Date   TSH 1.70 11/04/2021   Lab Results  Component Value Date   HGBA1C 5.2 12/09/2021   Lab Results  Component Value Date   CHOL 294 (H) 11/04/2021   HDL 118 11/04/2021   LDLCALC 160 (H) 11/04/2021   TRIG 65 11/04/2021   CHOLHDL 2.5 11/04/2021    Significant Diagnostic Results in last 30 days:  No results found.  Assessment/Plan  Lower abdominal pain Afebrile  - LUQ /LLQ tender to palpation  Will treat with ABX due to hx of diverticulosis - CBC with Differential/Platelet - CT Abdomen Pelvis Wo Contrast; Future - CMP with eGFR([facility]) - ciprofloxacin (CIPRO) 500 MG tablet; Take 1 tablet (500 mg total) by mouth 2 (two) times daily for 7 days.  Dispense: 14 tablet; Refill: 0 - metroNIDAZOLE (FLAGYL) 500 MG tablet; Take 1 tablet (500 mg total) by mouth 3 (three) times daily for 7 days.  Dispense: 21 tablet; Refill: 0  Family/ staff Communication: Reviewed plan of care with patient verbalized understanding  Labs/tests ordered:  - CBC with Differential/Platelet - CT Abdomen Pelvis Wo Contrast; Future - CMP with eGFR([facility])  Next Appointment: Return if symptoms worsen or fail to improve.   Sandrea Hughs, NP

## 2021-12-22 NOTE — Patient Instructions (Signed)
Diverticulitis  Diverticulitis is infection or inflammation of small pouches (diverticula) in the colon that form due to a condition called diverticulosis. Diverticula can trap stool (feces) and bacteria, causing infection and inflammation. Diverticulitis may cause severe stomach pain and diarrhea. It may lead to tissue damage in the colon that causes bleeding or blockage. The diverticula may also burst (rupture) and cause infected stool to enter other areas of the abdomen. What are the causes? This condition is caused by stool becoming trapped in the diverticula, which allows bacteria to grow in the diverticula. This leads to inflammation and infection. What increases the risk? You are more likely to develop this condition if you have diverticulosis. The risk increases if you: Are overweight or obese. Do not get enough exercise. Drink alcohol. Use tobacco products. Eat a diet that has a lot of red meat such as beef, pork, or lamb. Eat a diet that does not include enough fiber. High-fiber foods include fruits, vegetables, beans, nuts, and whole grains. Are over 40 years of age. What are the signs or symptoms? Symptoms of this condition may include: Pain and tenderness in the abdomen. The pain is normally located on the left side of the abdomen, but it may occur in other areas. Fever and chills. Nausea. Vomiting. Cramping. Bloating. Changes in bowel routines. Blood in your stool. How is this diagnosed? This condition is diagnosed based on: Your medical history. A physical exam. Tests to make sure there is nothing else causing your condition. These tests may include: Blood tests. Urine tests. CT scan of the abdomen. How is this treated? Most cases of this condition are mild and can be treated at home. Treatment may include: Taking over-the-counter pain medicines. Following a clear liquid diet. Taking antibiotic medicines by mouth. Resting. More severe cases may need to be treated  at a hospital. Treatment may include: Not eating or drinking. Taking prescription pain medicine. Receiving antibiotic medicines through an IV. Receiving fluids and nutrition through an IV. Surgery. When your condition is under control, your health care provider may recommend that you have a colonoscopy. This is an exam to look at the entire large intestine. During the exam, a lubricated, bendable tube is inserted into the anus and then passed into the rectum, colon, and other parts of the large intestine. A colonoscopy can show how severe your diverticula are and whether something else may be causing your symptoms. Follow these instructions at home: Medicines Take over-the-counter and prescription medicines only as told by your health care provider. These include fiber supplements, probiotics, and stool softeners. If you were prescribed an antibiotic medicine, take it as told by your health care provider. Do not stop taking the antibiotic even if you start to feel better. Ask your health care provider if the medicine prescribed to you requires you to avoid driving or using machinery. Eating and drinking  Follow a full liquid diet or another diet as directed by your health care provider. After your symptoms improve, your health care provider may tell you to change your diet. He or she may recommend that you eat a diet that contains at least 25 grams (25 g) of fiber daily. Fiber makes it easier to pass stool. Healthy sources of fiber include: Berries. One cup contains 4-8 grams of fiber. Beans or lentils. One-half cup contains 5-8 grams of fiber. Green vegetables. One cup contains 4 grams of fiber. Avoid eating red meat. General instructions Do not use any products that contain nicotine or tobacco, such as   cigarettes, e-cigarettes, and chewing tobacco. If you need help quitting, ask your health care provider. Exercise for at least 30 minutes, 3 times each week. You should exercise hard enough to  raise your heart rate and break a sweat. Keep all follow-up visits as told by your health care provider. This is important. You may need to have a colonoscopy. Contact a health care provider if: Your pain does not improve. Your bowel movements do not return to normal. Get help right away if: Your pain gets worse. Your symptoms do not get better with treatment. Your symptoms suddenly get worse. You have a fever. You vomit more than one time. You have stools that are bloody, black, or tarry. Summary Diverticulitis is infection or inflammation of small pouches (diverticula) in the colon that form due to a condition called diverticulosis. Diverticula can trap stool (feces) and bacteria, causing infection and inflammation. You are at higher risk for this condition if you have diverticulosis and you eat a diet that does not include enough fiber. Most cases of this condition are mild and can be treated at home. More severe cases may need to be treated at a hospital. When your condition is under control, your health care provider may recommend that you have an exam called a colonoscopy. This exam can show how severe your diverticula are and whether something else may be causing your symptoms. Keep all follow-up visits as told by your health care provider. This is important. This information is not intended to replace advice given to you by your health care provider. Make sure you discuss any questions you have with your health care provider. Document Revised: 02/27/2019 Document Reviewed: 02/27/2019 Elsevier Patient Education  2023 Elsevier Inc.  

## 2021-12-23 LAB — CBC WITH DIFFERENTIAL/PLATELET
Absolute Monocytes: 680 cells/uL (ref 200–950)
Basophils Absolute: 32 cells/uL (ref 0–200)
Basophils Relative: 0.6 %
Eosinophils Absolute: 81 cells/uL (ref 15–500)
Eosinophils Relative: 1.5 %
HCT: 37.8 % (ref 35.0–45.0)
Hemoglobin: 12.6 g/dL (ref 11.7–15.5)
Lymphs Abs: 945 cells/uL (ref 850–3900)
MCH: 29.9 pg (ref 27.0–33.0)
MCHC: 33.3 g/dL (ref 32.0–36.0)
MCV: 89.6 fL (ref 80.0–100.0)
MPV: 9.9 fL (ref 7.5–12.5)
Monocytes Relative: 12.6 %
Neutro Abs: 3661 cells/uL (ref 1500–7800)
Neutrophils Relative %: 67.8 %
Platelets: 252 10*3/uL (ref 140–400)
RBC: 4.22 10*6/uL (ref 3.80–5.10)
RDW: 12.5 % (ref 11.0–15.0)
Total Lymphocyte: 17.5 %
WBC: 5.4 10*3/uL (ref 3.8–10.8)

## 2021-12-23 LAB — COMPLETE METABOLIC PANEL WITH GFR
AG Ratio: 1.7 (calc) (ref 1.0–2.5)
ALT: 13 U/L (ref 6–29)
AST: 17 U/L (ref 10–35)
Albumin: 4.3 g/dL (ref 3.6–5.1)
Alkaline phosphatase (APISO): 59 U/L (ref 37–153)
BUN/Creatinine Ratio: 25 (calc) — ABNORMAL HIGH (ref 6–22)
BUN: 14 mg/dL (ref 7–25)
CO2: 26 mmol/L (ref 20–32)
Calcium: 10.2 mg/dL (ref 8.6–10.4)
Chloride: 99 mmol/L (ref 98–110)
Creat: 0.56 mg/dL — ABNORMAL LOW (ref 0.60–1.00)
Globulin: 2.6 g/dL (calc) (ref 1.9–3.7)
Glucose, Bld: 88 mg/dL (ref 65–139)
Potassium: 4.7 mmol/L (ref 3.5–5.3)
Sodium: 136 mmol/L (ref 135–146)
Total Bilirubin: 0.5 mg/dL (ref 0.2–1.2)
Total Protein: 6.9 g/dL (ref 6.1–8.1)
eGFR: 98 mL/min/{1.73_m2} (ref >=60)

## 2021-12-24 ENCOUNTER — Other Ambulatory Visit: Payer: Self-pay

## 2021-12-24 MED ORDER — ASPIRIN 81 MG PO TBEC: 81.0000 mg | DELAYED_RELEASE_TABLET | Freq: Every day | ORAL | 12 refills | Status: AC

## 2021-12-24 MED ORDER — ATORVASTATIN CALCIUM 10 MG PO TABS: 10.0000 mg | ORAL_TABLET | Freq: Every day | ORAL | 11 refills | Status: DC

## 2021-12-26 NOTE — Progress Notes (Signed)
Calcium level is a little bit elevated. Noted bone density done on 06/21/19 showed a T-score of -2.7, ranging in osteoporosis. Might want to start on Fosamax? Can be discussed in next office visit if interested. Vitamin D level is 28, slightly low. Pls double up on current Vitamin D3 (currently on 400 units, pls take 800 units/day).

## 2021-12-29 ENCOUNTER — Telehealth (INDEPENDENT_AMBULATORY_CARE_PROVIDER_SITE_OTHER): Payer: Medicare Other | Admitting: Adult Health

## 2021-12-29 ENCOUNTER — Encounter: Payer: Self-pay | Admitting: Adult Health

## 2021-12-29 VITALS — BP 118/60 | HR 68 | Temp 97.9°F | Resp 18 | Ht 64.0 in | Wt 127.6 lb

## 2021-12-29 DIAGNOSIS — R1030 Lower abdominal pain, unspecified: Secondary | ICD-10-CM | POA: Diagnosis not present

## 2021-12-29 DIAGNOSIS — R197 Diarrhea, unspecified: Secondary | ICD-10-CM | POA: Diagnosis not present

## 2021-12-29 DIAGNOSIS — K5790 Diverticulosis of intestine, part unspecified, without perforation or abscess without bleeding: Secondary | ICD-10-CM

## 2021-12-29 LAB — POCT URINALYSIS DIPSTICK
Bilirubin, UA: NEGATIVE
Glucose, UA: NEGATIVE
Ketones, UA: NEGATIVE
Leukocytes, UA: NEGATIVE
Nitrite, UA: NEGATIVE
Odor: NORMAL
Protein, UA: NEGATIVE
Spec Grav, UA: 1.025 (ref 1.010–1.025)
Urobilinogen, UA: 0.2 E.U./dL
pH, UA: 5 (ref 5.0–8.0)

## 2021-12-29 NOTE — Progress Notes (Unsigned)
[facility] clinic  Provider:   Durenda Age DNP  Code Status:  Full Code  Goals of Care:     12/22/2021    1:22 PM  Advanced Directives  Does Patient Have a Medical Advance Directive? No  Would patient like information on creating a medical advance directive? No - Patient declined     Chief Complaint  Patient presents with   Acute Visit    Patient is here for diverticulitis concerns     HPI: Patient is a 71 y.o. female seen today for an acute visit for right groin area pain X 2 days. She is currently on Cipro and Flagyl for Diverticulitis, will complete 7-day course today. She had a low grade fever today T 99.4. CT abdomen pelvis without contrast done on 12/22/21 showed colonic diverticulosis with distal descending diverticulitis, no abscess. She complains of lower abdomen crampy pains and lower back "like having menstruation". She stated that her dentist told her that she is needing to have root canal. She said that she still continues to have diarrhea. She wants to know today if she is having another episode of UTI. She has a PMH of anxiety and depression, right breast cancer S/P chemotherapy, headache and osteoporosis.    Past Medical History:  Diagnosis Date   Anxiety and depression    Breast cancer ([place]) 2001   RIGHT SIDE. S/P CHEMOTHERAPY   COVID 11/29/2020   Headache    Mitral valve regurgitation    Osteoporosis    Personal history of chemotherapy    Personal history of radiation therapy     Past Surgical History:  Procedure Laterality Date   APPENDECTOMY     AT AGE 53 YEARS OLD   BREAST LUMPECTOMY Right 2001   AT AGE 89 YEARS OLD   CHOLECYSTECTOMY     GALLBLADDER SURGERY  2012    Allergies  Allergen Reactions   Codeine     RINGING IN THE EARS.    Other Other (See Comments)    PAIN KILLERS   Oxycodone Itching   Sulfa Antibiotics Other (See Comments)   Ibuprofen Rash   Penicillins Swelling and Rash    Outpatient Encounter Medications as of  12/29/2021  Medication Sig   amoxicillin-clavulanate (AUGMENTIN) 875-125 MG tablet Take 1 tablet by mouth 2 (two) times daily.   aspirin EC 81 MG tablet Take 1 tablet (81 mg total) by mouth daily. Swallow whole.   atorvastatin (LIPITOR) 10 MG tablet Take 1 tablet (10 mg total) by mouth daily.   Biotin 10 MG CAPS Take by mouth.   calcium citrate (CALCITRATE - DOSED IN MG ELEMENTAL CALCIUM) 950 (200 Ca) MG tablet Take 200 mg of elemental calcium by mouth daily.   cholecalciferol (VITAMIN D INFANT) 10 MCG/ML LIQD Take 400 Units by mouth daily.   [EXPIRED] ciprofloxacin (CIPRO) 500 MG tablet Take 1 tablet (500 mg total) by mouth 2 (two) times daily for 7 days.   citalopram (CELEXA) 20 MG tablet Take one and a half tablet by mouth once daily.   folic acid (FOLVITE) 034 MCG tablet Take 400 mcg by mouth daily.   ibuprofen (ADVIL) 200 MG tablet Take 400 mg by mouth daily as needed.   ibuprofen (ADVIL) 800 MG tablet Take by mouth.   methocarbamol (ROBAXIN) 500 MG tablet Take 500 mg by mouth daily as needed for muscle spasms.   [EXPIRED] metroNIDAZOLE (FLAGYL) 500 MG tablet Take 1 tablet (500 mg total) by mouth 3 (three) times daily for 7  days.   Probiotic Product (PROBIOTIC-10 PO) Take by mouth.   traZODone (DESYREL) 50 MG tablet Take 1 tablet (50 mg total) by mouth at bedtime.   Wheat Dextrin (BENEFIBER DRINK MIX PO) Take 1 Package by mouth daily.   No facility-administered encounter medications on file as of 12/29/2021.    Review of Systems:  Review of Systems  Constitutional:  Positive for fever. Negative for appetite change, chills and fatigue.  HENT:  Positive for dental problem. Negative for congestion, facial swelling, hearing loss, rhinorrhea and sore throat.   Eyes: Negative.   Respiratory:  Negative for cough, shortness of breath and wheezing.   Cardiovascular:  Negative for chest pain, palpitations and leg swelling.  Gastrointestinal:  Positive for abdominal pain and diarrhea. Negative  for blood in stool, constipation, nausea and vomiting.  Genitourinary:  Negative for dysuria.  Musculoskeletal:  Negative for arthralgias, back pain and myalgias.  Skin:  Negative for color change, rash and wound.  Neurological:  Negative for dizziness, weakness and headaches.  Psychiatric/Behavioral:  Negative for behavioral problems. The patient is not nervous/anxious.     Health Maintenance  Topic Date Due   COLONOSCOPY (Pts 45-73yr Insurance coverage will need to be confirmed)  Never done   COVID-19 Vaccine (4 - Pfizer series) 07/08/2020   INFLUENZA VACCINE  12/30/2021   MAMMOGRAM  01/04/2023   TETANUS/TDAP  03/08/2029   Pneumonia Vaccine 70 Years old  Completed   DEXA SCAN  Completed   Zoster Vaccines- Shingrix  Completed   HPV VACCINES  Aged Out   Hepatitis C Screening  Discontinued    Physical Exam: Vitals:   12/29/21 1551  BP: 118/60  Pulse: 68  Resp: 18  Temp: 97.9 F (36.6 C)  TempSrc: Temporal  SpO2: 99%  Weight: 127 lb 9.6 oz (57.9 kg)  Height: '5\' 4"'  (1.626 m)   Body mass index is 21.9 kg/m. Physical Exam  Labs reviewed: Basic Metabolic Panel: Recent Labs    03/27/21 0915 03/27/21 0915 05/20/21 1026 11/04/21 1112 12/09/21 1046 12/22/21 1350 12/29/21 1630  NA 136   < > 137 136 137 136 135  K 4.2  --  4.0 4.2 4.1 4.7 4.2  CL 98  --  101 99 102 99 97*  CO2 24  --  '29 30 30 26 29  ' GLUCOSE 85   < > 85 105* 98 88 101*  BUN 11   < > '11 11 11 14 13  ' CREATININE 0.71   < > 0.64 0.61 0.70 0.56* 0.72  CALCIUM 10.7*  --  10.0 10.7* 11.1*  11.0* 10.2 10.4  TSH 1.400  --  1.34 1.70  --   --   --    < > = values in this interval not displayed.   Liver Function Tests: Recent Labs    03/27/21 0915 05/20/21 1026 11/04/21 1112 12/09/21 1046 12/22/21 1350  AST 28   < > '26 21 17  ' ALT 18   < > '19 17 13  ' ALKPHOS 58  --   --   --   --   BILITOT 0.5   < > 0.9 0.8 0.5  PROT 6.7   < > 6.8 6.5 6.9  ALBUMIN 5.0*  --   --   --   --    < > = values in this  interval not displayed.   No results for input(s): "LIPASE", "AMYLASE" in the last 8760 hours. No results for input(s): "AMMONIA" in the last 8760 hours.  CBC: Recent Labs    12/09/21 1046 12/22/21 1350 12/29/21 1630  WBC 3.3* 5.4 6.9  NEUTROABS 1,937 3,661 4,782  HGB 12.8 12.6 12.4  HCT 37.8 37.8 36.4  MCV 89.6 89.6 88.1  PLT 210 252 300   Lipid Panel: Recent Labs    05/20/21 1026 11/04/21 1112  CHOL 273* 294*  HDL 111 118  LDLCALC 143* 160*  TRIG 85 65  CHOLHDL 2.5 2.5   Lab Results  Component Value Date   HGBA1C 5.2 12/09/2021    Procedures since last visit: CT Abdomen Pelvis Wo Contrast  Result Date: 12/22/2021 CLINICAL DATA:  Left lower quadrant pain EXAM: CT ABDOMEN AND PELVIS WITHOUT CONTRAST TECHNIQUE: Multidetector CT imaging of the abdomen and pelvis was performed following the standard protocol without IV contrast. RADIATION DOSE REDUCTION: This exam was performed according to the departmental dose-optimization program which includes automated exposure control, adjustment of the mA and/or kV according to patient size and/or use of iterative reconstruction technique. COMPARISON:  06/29/2018 FINDINGS: Lower chest: No pleural or pericardial effusion. Visualized lung bases clear. Hepatobiliary: No focal liver abnormality is seen. Status post cholecystectomy. No biliary dilatation. Pancreas: Unremarkable. No pancreatic ductal dilatation or surrounding inflammatory changes. Spleen: Normal in size without focal abnormality. Adrenals/Urinary Tract: Adrenal glands are unremarkable. Kidneys are normal, without renal calculi, focal lesion, or hydronephrosis. Bladder is unremarkable. Stomach/Bowel: Stomach and small bowel are nondilated, unremarkable. Post appendectomy. Scattered diverticula throughout the colon. There are inflammatory/edematous changes around the distal descending segment. No abscess. Vascular/Lymphatic: Minimal aortoiliac calcified atheromatous plaque without  aneurysm. No abdominal or pelvic adenopathy. Reproductive: Uterus and bilateral adnexa are unremarkable. Other: No ascites.  Bilateral pelvic phleboliths.  No free air. Musculoskeletal: No acute or significant osseous findings. IMPRESSION: 1. Colonic diverticulosis with distal descending diverticulitis, no abscess. 2.  Aortic Atherosclerosis (ICD10-170.0). Electronically Signed   By: Lucrezia Europe M.D.   On: 12/22/2021 16:44    Assessment/Plan  1. Lower abdominal pain - POC Urinalysis Dipstick showed  urine appearance is cloudy, negative glucose, ketones, nitrite and leukocyte, has moderate amount of blood. - Culture, Urine -  increase oral fluid intake  2. Diverticulosis -  continue Cipro and Flagyl  3. Diarrhea, unspecified type -   will check stool for C-difficile  - Clostridium difficile culture-fecal - CBC with Differential/Platelets - BMP with eGFR([facility])     Labs/tests ordered:  CBC with differentials, BMP, stool culture for C-difficile, urine dipstick and culture  Next appt:  06/10/2022

## 2021-12-29 NOTE — Patient Instructions (Signed)
Abdominal Pain, Adult Many things can cause belly (abdominal) pain. Most times, belly pain is not dangerous. Many cases of belly pain can be watched and treated at home. Sometimes, though, belly pain is serious. Your doctor will try to find the cause of your belly pain. Follow these instructions at home:  Medicines Take over-the-counter and prescription medicines only as told by your doctor. Do not take medicines that help you poop (laxatives) unless told by your doctor. General instructions Watch your belly pain for any changes. Drink enough fluid to keep your pee (urine) pale yellow. Keep all follow-up visits as told by your doctor. This is important. Contact a doctor if: Your belly pain changes or gets worse. You are not hungry, or you lose weight without trying. You are having trouble pooping (constipated) or have watery poop (diarrhea) for more than 2-3 days. You have pain when you pee or poop. Your belly pain wakes you up at night. Your pain gets worse with meals, after eating, or with certain foods. You are vomiting and cannot keep anything down. You have a fever. You have blood in your pee. Get help right away if: Your pain does not go away as soon as your doctor says it should. You cannot stop vomiting. Your pain is only in areas of your belly, such as the right side or the left lower part of the belly. You have bloody or black poop, or poop that looks like tar. You have very bad pain, cramping, or bloating in your belly. You have signs of not having enough fluid or water in your body (dehydration), such as: Dark pee, very little pee, or no pee. Cracked lips. Dry mouth. Sunken eyes. Sleepiness. Weakness. You have trouble breathing or chest pain. Summary Many cases of belly pain can be watched and treated at home. Watch your belly pain for any changes. Take over-the-counter and prescription medicines only as told by your doctor. Contact a doctor if your belly pain  changes or gets worse. Get help right away if you have very bad pain, cramping, or bloating in your belly. This information is not intended to replace advice given to you by your health care provider. Make sure you discuss any questions you have with your health care provider. Document Revised: 09/26/2018 Document Reviewed: 09/26/2018 Elsevier Patient Education  2023 Elsevier Inc.  

## 2021-12-30 LAB — BASIC METABOLIC PANEL WITH GFR
BUN: 13 mg/dL (ref 7–25)
CO2: 29 mmol/L (ref 20–32)
Calcium: 10.4 mg/dL (ref 8.6–10.4)
Chloride: 97 mmol/L — ABNORMAL LOW (ref 98–110)
Creat: 0.72 mg/dL (ref 0.60–1.00)
Glucose, Bld: 101 mg/dL — ABNORMAL HIGH (ref 65–99)
Potassium: 4.2 mmol/L (ref 3.5–5.3)
Sodium: 135 mmol/L (ref 135–146)
eGFR: 90 mL/min/{1.73_m2} (ref >=60)

## 2021-12-30 LAB — CBC WITH DIFFERENTIAL/PLATELET
Absolute Monocytes: 952 cells/uL — ABNORMAL HIGH (ref 200–950)
Basophils Absolute: 41 cells/uL (ref 0–200)
Basophils Relative: 0.6 %
Eosinophils Absolute: 62 cells/uL (ref 15–500)
Eosinophils Relative: 0.9 %
HCT: 36.4 % (ref 35.0–45.0)
Hemoglobin: 12.4 g/dL (ref 11.7–15.5)
Lymphs Abs: 1063 cells/uL (ref 850–3900)
MCH: 30 pg (ref 27.0–33.0)
MCHC: 34.1 g/dL (ref 32.0–36.0)
MCV: 88.1 fL (ref 80.0–100.0)
MPV: 9.7 fL (ref 7.5–12.5)
Monocytes Relative: 13.8 %
Neutro Abs: 4782 cells/uL (ref 1500–7800)
Neutrophils Relative %: 69.3 %
Platelets: 300 10*3/uL (ref 140–400)
RBC: 4.13 10*6/uL (ref 3.80–5.10)
RDW: 12.5 % (ref 11.0–15.0)
Total Lymphocyte: 15.4 %
WBC: 6.9 10*3/uL (ref 3.8–10.8)

## 2021-12-30 LAB — URINE CULTURE
MICRO NUMBER:: 13716481
Result:: NO GROWTH
SPECIMEN QUALITY:: ADEQUATE

## 2021-12-31 NOTE — Progress Notes (Signed)
CBC and BMP within normal. Calcium and WBC all within normal. Urine culture showed no growth.

## 2022-01-03 IMAGING — MG MM DIGITAL SCREENING BILAT W/ TOMO AND CAD
6 of 10 series · 6 of 30 positions shown · non-contrast
Comparison: Previous exam(s).

CLINICAL DATA: Screening.

EXAM:
DIGITAL SCREENING BILATERAL MAMMOGRAM WITH TOMOSYNTHESIS AND CAD
TECHNIQUE: Bilateral screening digital craniocaudal and mediolateral oblique
mammograms were obtained. Bilateral screening digital breast
tomosynthesis was performed. The images were evaluated with
computer-aided detection.

[L CC synth-2D]
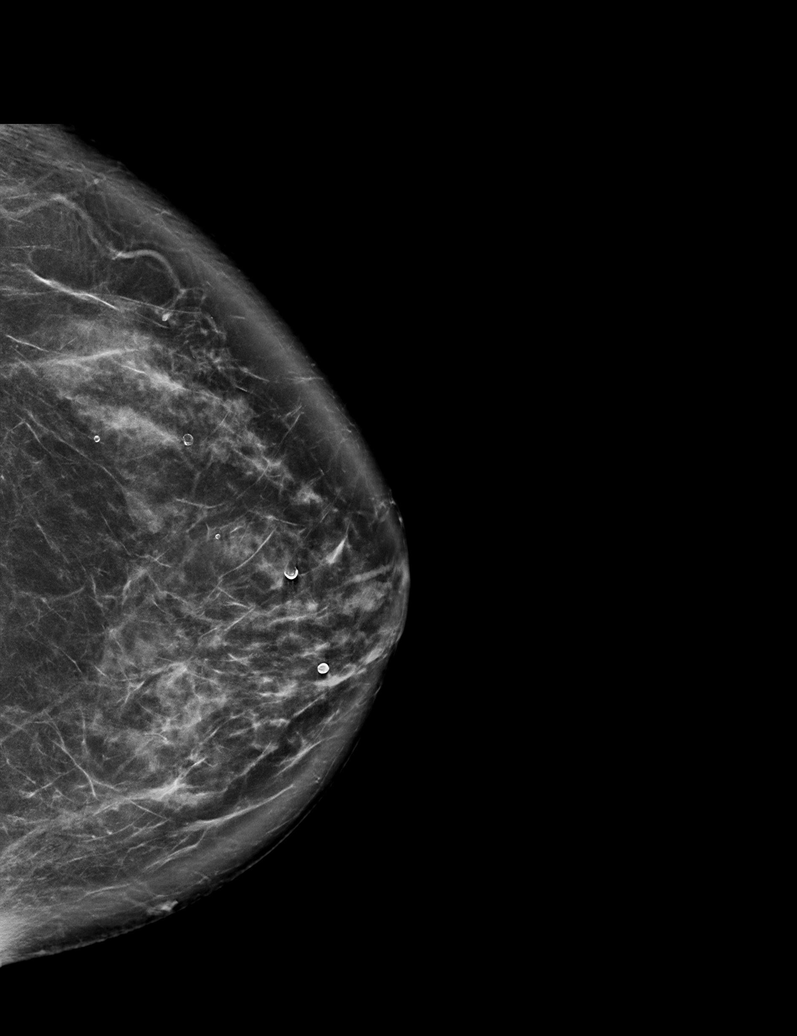

[L MLO synth-2D]
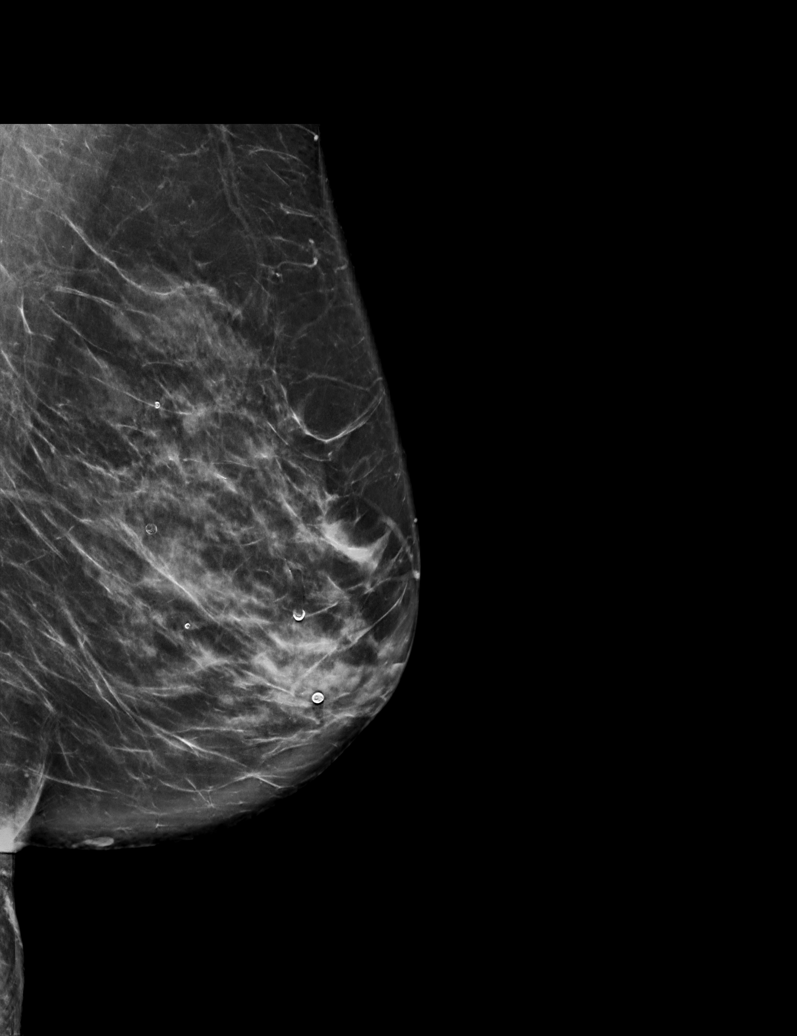

[R XCCL synth-2D]
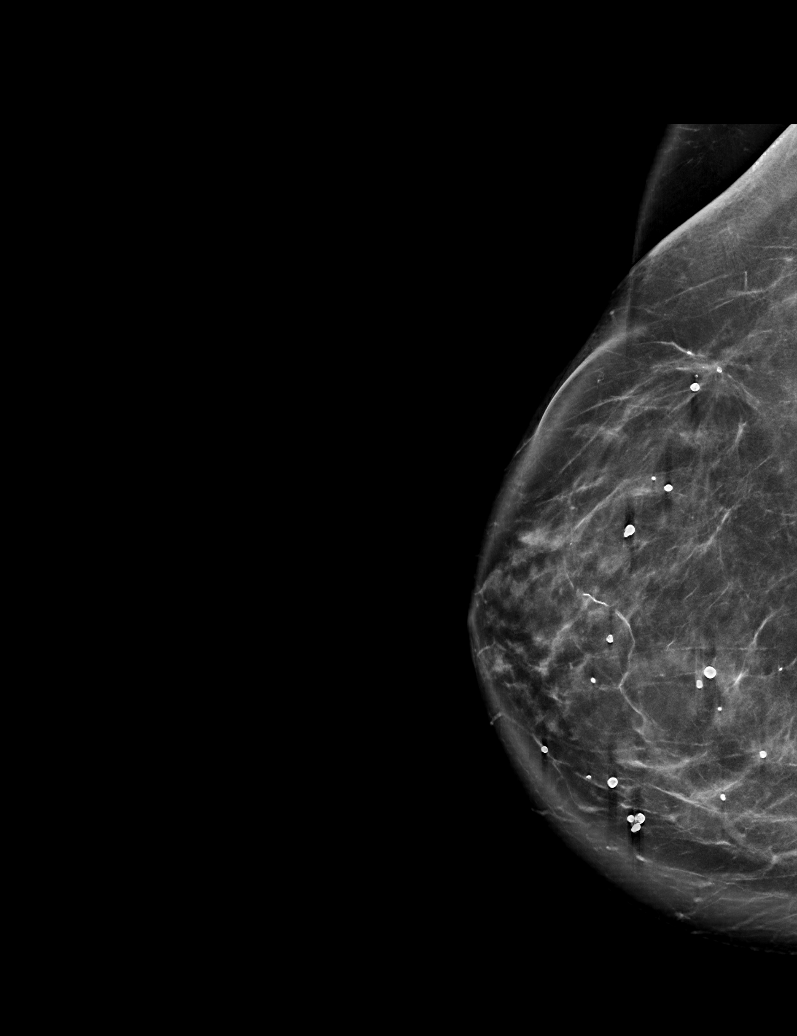

[R CC synth-2D]
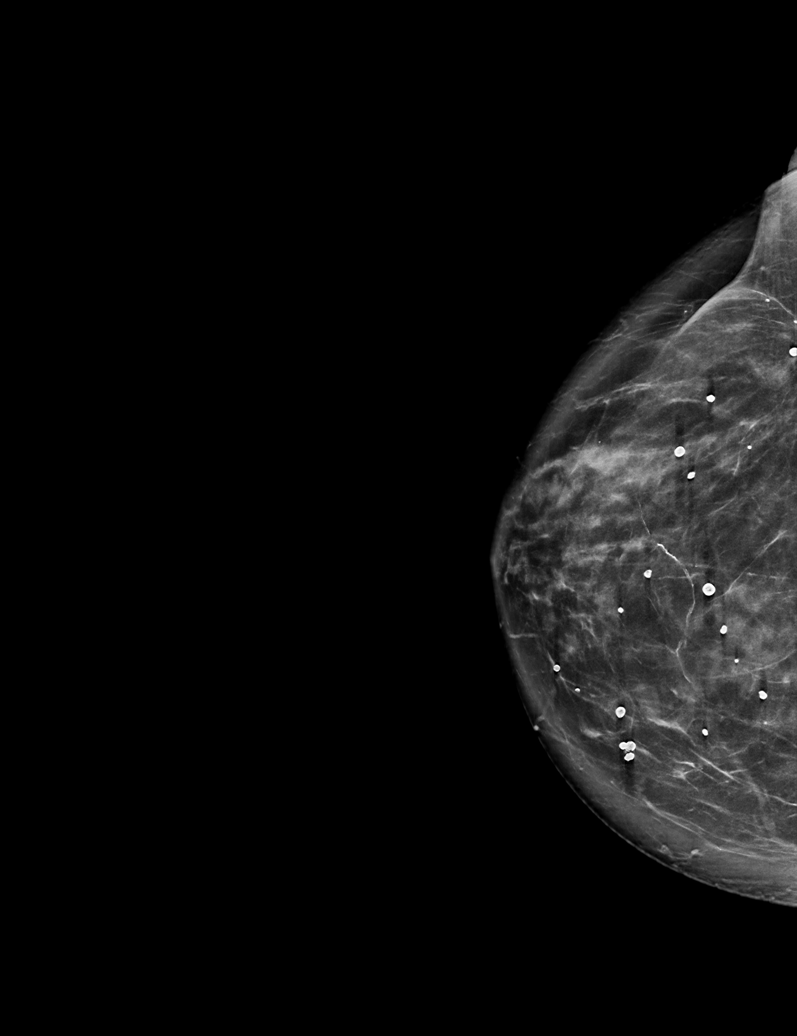

[R MLO synth-2D]
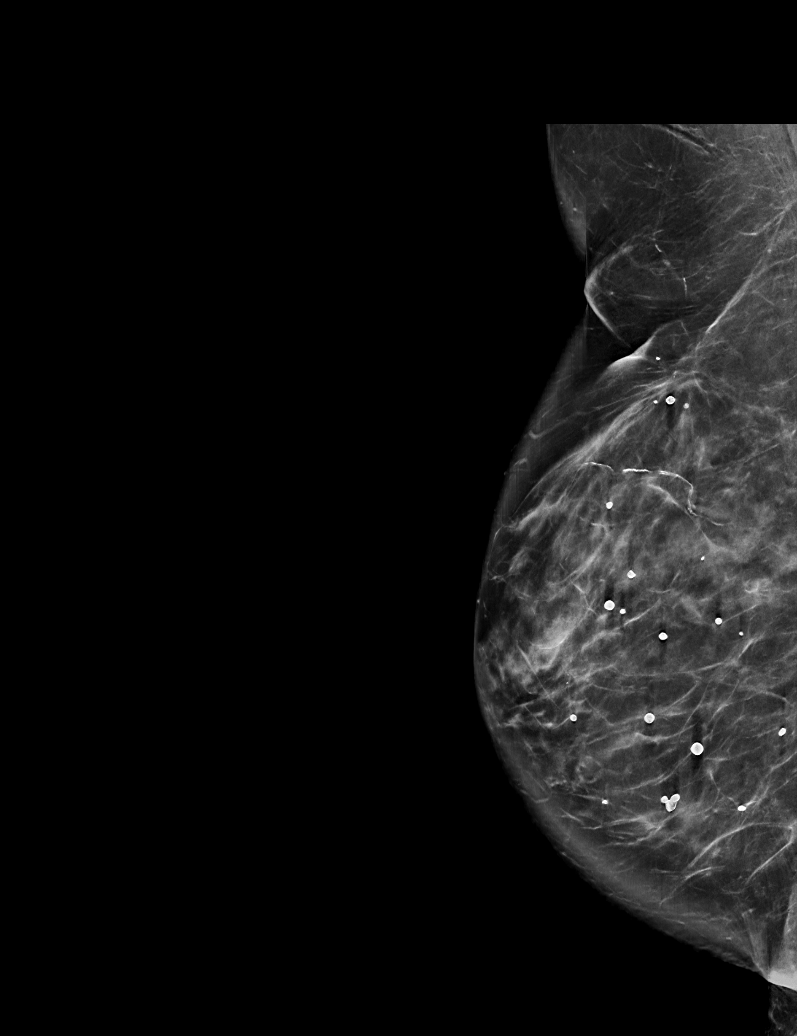

[R CC tomo · tomo slice 43/85.0]
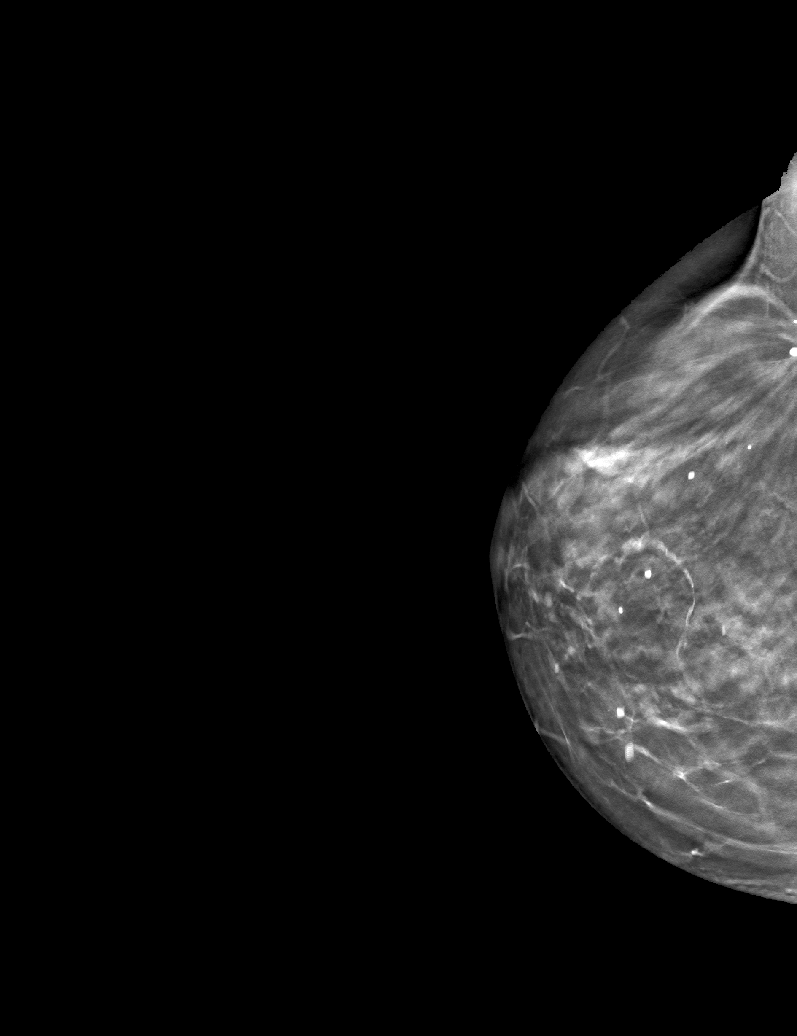

[6 of 30 positions shown; findings below may reference images not displayed]

ACR Breast Density Category c: The breast tissue is heterogeneously
dense, which may obscure small masses.
FINDINGS: There are no findings suspicious for malignancy.
IMPRESSION: No mammographic evidence of malignancy. A result letter of this
screening mammogram will be mailed directly to the patient.

RECOMMENDATION:
Screening mammogram in one year. (Code:Q3-W-BC3)

BI-RADS CATEGORY  1: Negative.

## 2022-01-07 ENCOUNTER — Other Ambulatory Visit: Payer: Self-pay | Admitting: Adult Health

## 2022-01-07 LAB — C.DIFF TOXINB QL PCR: CLOSTRIDIUM DIFFICILE TOXINB,QL REAL TIME PCR: DETECTED — CR

## 2022-01-07 LAB — CLOSTRIDIUM DIFFICILE CULTURE-FECAL

## 2022-01-07 MED ORDER — FIDAXOMICIN 200 MG PO TABS
200.0000 mg | ORAL_TABLET | Freq: Two times a day (BID) | ORAL | 0 refills | Status: DC
Start: 2022-01-07 — End: 2022-01-08

## 2022-01-07 NOTE — Telephone Encounter (Signed)
Error

## 2022-01-07 NOTE — Progress Notes (Signed)
Stool culture was positive for c-difficile, sent e-prescription of Fidaxomicin to pharmacy. Continue drinking fluids and eating healthy balance diet as tolerated.

## 2022-01-08 ENCOUNTER — Telehealth: Payer: Self-pay

## 2022-01-08 ENCOUNTER — Ambulatory Visit (INDEPENDENT_AMBULATORY_CARE_PROVIDER_SITE_OTHER): Payer: Medicare Other | Admitting: Nurse Practitioner

## 2022-01-08 ENCOUNTER — Encounter: Payer: Self-pay | Admitting: Nurse Practitioner

## 2022-01-08 DIAGNOSIS — I700 Atherosclerosis of aorta: Secondary | ICD-10-CM | POA: Diagnosis not present

## 2022-01-08 DIAGNOSIS — A0472 Enterocolitis due to Clostridium difficile, not specified as recurrent: Secondary | ICD-10-CM | POA: Diagnosis not present

## 2022-01-08 MED ORDER — VANCOMYCIN HCL 125 MG PO CAPS: 125.0000 mg | ORAL_CAPSULE | Freq: Four times a day (QID) | ORAL | 0 refills | Status: AC

## 2022-01-08 NOTE — Progress Notes (Signed)
Careteam: Patient Care Team: Mariah Chandler, NP as PCP - General (Geriatric Medicine) Mariah Booze, MD as PCP - Cardiology (Cardiology) Mariah Pacas, MD as Consulting Physician (Neurology)  Advanced Directive information Does Patient Have a Medical Advance Directive?: No, Would patient like information on creating a medical advance directive?: No - Patient declined  Allergies  Allergen Reactions   Codeine     RINGING IN THE EARS.    Other Other (See Comments)    PAIN KILLERS   Oxycodone Itching   Sulfa Antibiotics Other (See Comments)   Ibuprofen Rash   Penicillins Swelling and Rash    Chief Complaint  Patient presents with   Acute Visit    Telephone visit to discuss recent health concerns      HPI: Patient is a 71 y.o. female to discuss labs results/c diff.  She was prescribed dificid but has not started taking.  Reports she feels like she has had these symptoms for a long time. She has been having chronic fatigue. She rarely uses antibiotics but recently was on antibiotics due to diverticulitis.  She continues to have diarrhea. Several episodes this mornings.   No fevers but having chills.  Abdominal tenderness and cramping.   It was mentioned to start a statin due to aortic arthr Review of Systems:  Review of Systems  Constitutional:  Positive for malaise/fatigue. Negative for chills and fever.  Gastrointestinal:  Positive for abdominal pain and diarrhea.    Past Medical History:  Diagnosis Date   Anxiety and depression    Breast cancer ([place]) 2001   RIGHT SIDE. S/P CHEMOTHERAPY   COVID 11/29/2020   Headache    Mitral valve regurgitation    Osteoporosis    Personal history of chemotherapy    Personal history of radiation therapy    Past Surgical History:  Procedure Laterality Date   APPENDECTOMY     AT AGE 50 YEARS OLD   BREAST LUMPECTOMY Right 2001   AT AGE 31 YEARS OLD   CHOLECYSTECTOMY     GALLBLADDER SURGERY  2012   Social  History:   reports that she has never smoked. She has never used smokeless tobacco. She reports current alcohol use. She reports that she does not use drugs.  Family History  Problem Relation Age of Onset   Dementia Mother 9   Diabetes Mother    Hypertension Mother    Melanoma Father    Mental illness Father    CVA Father    Breast cancer Sister    Emphysema Paternal Grandmother        never smoker   Asthma Sister        had asthma as a child    Medications: Patient's Medications  New Prescriptions   No medications on file  Previous Medications   ASPIRIN EC 81 MG TABLET    Take 1 tablet (81 mg total) by mouth daily. Swallow whole.   ATORVASTATIN (LIPITOR) 10 MG TABLET    Take 1 tablet (10 mg total) by mouth daily.   B COMPLEX VITAMINS CAPSULE    Take 1 capsule by mouth daily.   BIOTIN 10 MG CAPS    Take by mouth.   CALCIUM CITRATE (CALCITRATE - DOSED IN MG ELEMENTAL CALCIUM) 950 (200 CA) MG TABLET    Take 200 mg of elemental calcium by mouth daily.   CHOLECALCIFEROL (VITAMIN D INFANT) 10 MCG/ML LIQD    Take 400 Units by mouth daily.   CITALOPRAM (CELEXA) 20  MG TABLET    Take one and a half tablet by mouth once daily.   COLLAGENASE POWD    by Does not apply route. Vital Protein Collagen Peptides 2 scoops into coffee daily   CYANOCOBALAMIN (VITAMIN B12) 1000 MCG TABLET    Take 1,000 mcg by mouth daily.   FIDAXOMICIN (DIFICID) 200 MG TABS TABLET    Take 1 tablet (200 mg total) by mouth 2 (two) times daily for 10 days.   FOLIC ACID (FOLVITE) 601 MCG TABLET    Take 400 mcg by mouth daily.   IBUPROFEN (ADVIL) 800 MG TABLET    Take by mouth as needed.   METHOCARBAMOL (ROBAXIN) 500 MG TABLET    Take 500 mg by mouth daily as needed for muscle spasms.   PROBIOTIC PRODUCT (PROBIOTIC-10 PO)    Take by mouth.   TRAZODONE (DESYREL) 50 MG TABLET    Take 1 tablet (50 mg total) by mouth at bedtime.   WHEAT DEXTRIN (BENEFIBER DRINK MIX PO)    Take 1 Package by mouth daily.  Modified Medications    No medications on file  Discontinued Medications   AMOXICILLIN-CLAVULANATE (AUGMENTIN) 875-125 MG TABLET    Take 1 tablet by mouth 2 (two) times daily.   IBUPROFEN (ADVIL) 200 MG TABLET    Take 400 mg by mouth daily as needed.    Physical Exam:  There were no vitals filed for this visit. There is no height or weight on file to calculate BMI. Wt Readings from Last 3 Encounters:  12/29/21 127 lb 9.6 oz (57.9 kg)  12/22/21 128 lb 3.2 oz (58.2 kg)  11/07/21 129 lb 3.2 oz (58.6 kg)      Labs reviewed: Basic Metabolic Panel: Recent Labs    03/27/21 0915 03/27/21 0915 05/20/21 1026 11/04/21 1112 12/09/21 1046 12/22/21 1350 12/29/21 1630  NA 136   < > 137 136 137 136 135  K 4.2  --  4.0 4.2 4.1 4.7 4.2  CL 98  --  101 99 102 99 97*  CO2 24  --  '29 30 30 26 29  '$ GLUCOSE 85   < > 85 105* 98 88 101*  BUN 11   < > '11 11 11 14 13  '$ CREATININE 0.71   < > 0.64 0.61 0.70 0.56* 0.72  CALCIUM 10.7*  --  10.0 10.7* 11.1*  11.0* 10.2 10.4  TSH 1.400  --  1.34 1.70  --   --   --    < > = values in this interval not displayed.   Liver Function Tests: Recent Labs    03/27/21 0915 05/20/21 1026 11/04/21 1112 12/09/21 1046 12/22/21 1350  AST 28   < > '26 21 17  '$ ALT 18   < > '19 17 13  '$ ALKPHOS 58  --   --   --   --   BILITOT 0.5   < > 0.9 0.8 0.5  PROT 6.7   < > 6.8 6.5 6.9  ALBUMIN 5.0*  --   --   --   --    < > = values in this interval not displayed.   No results for input(s): "LIPASE", "AMYLASE" in the last 8760 hours. No results for input(s): "AMMONIA" in the last 8760 hours. CBC: Recent Labs    12/09/21 1046 12/22/21 1350 12/29/21 1630  WBC 3.3* 5.4 6.9  NEUTROABS 1,937 3,661 4,782  HGB 12.8 12.6 12.4  HCT 37.8 37.8 36.4  MCV 89.6 89.6 88.1  PLT  210 252 300   Lipid Panel: Recent Labs    05/20/21 1026 11/04/21 1112  CHOL 273* 294*  HDL 111 118  LDLCALC 143* 160*  TRIG 85 65  CHOLHDL 2.5 2.5   TSH: Recent Labs    03/27/21 0915 05/20/21 1026  11/04/21 1112  TSH 1.400 1.34 1.70   A1C: Lab Results  Component Value Date   HGBA1C 5.2 12/09/2021     Assessment/Plan 1. C. difficile colitis -continues to have diarrhea.  -continue probiotic daily -bland diet and advanced as tolerates  - vancomycin (VANCOCIN) 125 MG capsule; Take 1 capsule (125 mg total) by mouth 4 (four) times daily for 10 days.  Dispense: 40 capsule; Refill: 0  2. Aortic atherosclerosis ([place]) Was notified of finding on CT but does not want to start statin at this time  Wants to and Will discuss further at next follow up.    Next appt: 2 weeks  Grettel Rames K. Harle Battiest  [facility] [phone]    Virtual Visit via telephone.   I connected with patient on 01/08/22 at  1:00 PM EDT by telephone and verified that I am speaking with the correct person using two identifiers.  Location: Patient: home Provider: [facility]    I discussed the limitations, risks, security and privacy concerns of performing an evaluation and management service by telephone and the availability of in person appointments. I also discussed with the patient that there may be a patient responsible charge related to this service. The patient expressed understanding and agreed to proceed.   I discussed the assessment and treatment plan with the patient. The patient was provided an opportunity to ask questions and all were answered. The patient agreed with the plan and demonstrated an understanding of the instructions.   The patient was advised to call back or seek an in-person evaluation if the symptoms worsen or if the condition fails to improve as anticipated.  I provided 15 minutes of non-face-to-face time during this encounter.  Carlos American. Harle Battiest Avs printed and mailed

## 2022-01-08 NOTE — Progress Notes (Signed)
   This service is provided via telemedicine  No vital signs collected/recorded due to the encounter was a telemedicine visit.   Location of patient (ex: home, work):  Home  Patient consents to a telephone visit: Yes, see telephone visit dated 01/08/22  Location of the provider (ex: office, home):  [place], [place] Location  Name of any referring provider:  N/A  Names of all persons participating in the telemedicine service and their role in the encounter:  S.Chrae B/CMA, Sherrie Mustache, NP, and Patient   Time spent on call:  9 min with medical assistant

## 2022-01-08 NOTE — Telephone Encounter (Signed)
Ms. tekisha, darcey are scheduled for a virtual visit with your provider today.    Just as we do with appointments in the office, we must obtain your consent to participate.  Your consent will be active for this visit and any virtual visit you may have with one of our providers in the next 365 days.    If you have a MyChart account, I can also send a copy of this consent to you electronically.  All virtual visits are billed to your insurance company just like a traditional visit in the office.  As this is a virtual visit, video technology does not allow for your provider to perform a traditional examination.  This may limit your provider's ability to fully assess your condition.  If your provider identifies any concerns that need to be evaluated in person or the need to arrange testing such as labs, EKG, etc, we will make arrangements to do so.    Although advances in technology are sophisticated, we cannot ensure that it will always work on either your end or our end.  If the connection with a video visit is poor, we may have to switch to a telephone visit.  With either a video or telephone visit, we are not always able to ensure that we have a secure connection.   I need to obtain your verbal consent now.   Are you willing to proceed with your visit today?   Lashay W Bernardy has provided verbal consent on 01/08/2022 for a virtual visit (video or telephone).   Leigh Aurora [place], [place] 01/08/2022  12:37 PM

## 2022-01-09 ENCOUNTER — Ambulatory Visit: Payer: Medicare Other | Admitting: Nurse Practitioner

## 2022-01-19 ENCOUNTER — Encounter: Payer: Self-pay | Admitting: Nurse Practitioner

## 2022-01-19 NOTE — Progress Notes (Unsigned)
   This service is provided via telemedicine  No vital signs collected/recorded due to the encounter was a telemedicine visit.   Location of patient (ex: home, work):  Home  Patient consents to a telephone visit: Yes, see telephone visit dated 01/08/22  Location of the provider (ex: office, home):  [place], [place] Location   Name of any referring provider:  N/A  Names of all persons participating in the telemedicine service and their role in the encounter:  S.Chrae B/CMA, Sherrie Mustache, NP, and Patient   Time spent on call:  9 min with medical assistant

## 2022-01-20 ENCOUNTER — Encounter: Payer: Self-pay | Admitting: Nurse Practitioner

## 2022-01-20 ENCOUNTER — Telehealth: Payer: Self-pay

## 2022-01-20 ENCOUNTER — Ambulatory Visit (INDEPENDENT_AMBULATORY_CARE_PROVIDER_SITE_OTHER): Payer: Medicare Other | Admitting: Nurse Practitioner

## 2022-01-20 DIAGNOSIS — A0472 Enterocolitis due to Clostridium difficile, not specified as recurrent: Secondary | ICD-10-CM | POA: Diagnosis not present

## 2022-01-20 MED ORDER — FIDAXOMICIN 200 MG PO TABS: 200.0000 mg | ORAL_TABLET | Freq: Two times a day (BID) | ORAL | 0 refills | Status: DC

## 2022-01-20 NOTE — Telephone Encounter (Signed)
Ms. keayra, graham are scheduled for a virtual visit with your provider today.    Just as we do with appointments in the office, we must obtain your consent to participate.  Your consent will be active for this visit and any virtual visit you may have with one of our providers in the next 365 days.    If you have a MyChart account, I can also send a copy of this consent to you electronically.  All virtual visits are billed to your insurance company just like a traditional visit in the office.  As this is a virtual visit, video technology does not allow for your provider to perform a traditional examination.  This may limit your provider's ability to fully assess your condition.  If your provider identifies any concerns that need to be evaluated in person or the need to arrange testing such as labs, EKG, etc, we will make arrangements to do so.    Although advances in technology are sophisticated, we cannot ensure that it will always work on either your end or our end.  If the connection with a video visit is poor, we may have to switch to a telephone visit.  With either a video or telephone visit, we are not always able to ensure that we have a secure connection.   I need to obtain your verbal consent now.   Are you willing to proceed with your visit today?   Saia W Avetisyan has provided verbal consent on 01/20/2022 for a virtual visit (video or telephone).   Leigh Aurora [place], [place] 01/20/2022  8:28 AM

## 2022-01-20 NOTE — Progress Notes (Signed)
Careteam: Patient Care Team: Lauree Chandler, NP as PCP - General (Geriatric Medicine) [name], MD as PCP - Cardiology (Cardiology) Marcial Pacas, MD as Consulting Physician (Neurology)  Advanced Directive information    Allergies  Allergen Reactions   Codeine     RINGING IN THE EARS.    Other Other (See Comments)    PAIN KILLERS   Oxycodone Itching   Sulfa Antibiotics Other (See Comments)   Ibuprofen Rash   Penicillins Swelling and Rash    Chief Complaint  Patient presents with   Acute Visit    Patient c/o watery stools, aches, cramping in belly, low grade fever (98.8, normal temp is much lower), chills, and pains in lower back area. Patient is not responding to antibiotics and has no energy. Visit performed via telephone call.      HPI: Patient is a 71 y.o. female via telephone visit.  Pt reports she has low grade fever, cramping, diarrhea after completing vancomycin.  She has been feeling poorly since July 12th.  She has been very fatigue.  She was originally prescribed dificid which was 1500$, then changed to Vanco twice daily which she completed 10 day course She is taking a probiotic twice daily Stool are unchanged. Liquid stools.  She is having multiple stools daily   Review of Systems:  Review of Systems  Constitutional:  Positive for chills, fever and malaise/fatigue.  Cardiovascular:  Negative for chest pain and palpitations.  Gastrointestinal:  Positive for abdominal pain and diarrhea. Negative for constipation, heartburn, nausea and vomiting.  Neurological:  Negative for dizziness, weakness and headaches.    Past Medical History:  Diagnosis Date   Anxiety and depression    Breast cancer ([place]) 2001   RIGHT SIDE. S/P CHEMOTHERAPY   COVID 11/29/2020   Headache    Mitral valve regurgitation    Osteoporosis    Personal history of chemotherapy    Personal history of radiation therapy    Past Surgical History:  Procedure Laterality  Date   APPENDECTOMY     AT AGE 49 YEARS OLD   BREAST LUMPECTOMY Right 2001   AT AGE 22 YEARS OLD   CHOLECYSTECTOMY     GALLBLADDER SURGERY  2012   Social History:   reports that she has never smoked. She has never used smokeless tobacco. She reports current alcohol use. She reports that she does not use drugs.  Family History  Problem Relation Age of Onset   Dementia Mother 12   Diabetes Mother    Hypertension Mother    Melanoma Father    Mental illness Father    CVA Father    Breast cancer Sister    Emphysema Paternal Grandmother        never smoker   Asthma Sister        had asthma as a child    Medications: Patient's Medications  New Prescriptions   No medications on file  Previous Medications   ASPIRIN EC 81 MG TABLET    Take 1 tablet (81 mg total) by mouth daily. Swallow whole.   ATORVASTATIN (LIPITOR) 10 MG TABLET    Take 1 tablet (10 mg total) by mouth daily.   B COMPLEX VITAMINS CAPSULE    Take 1 capsule by mouth daily.   BIOTIN 10 MG CAPS    Take by mouth.   CALCIUM CITRATE (CALCITRATE - DOSED IN MG ELEMENTAL CALCIUM) 950 (200 CA) MG TABLET    Take 200 mg of elemental calcium  by mouth daily.   CHOLECALCIFEROL (VITAMIN D INFANT) 10 MCG/ML LIQD    Take 400 Units by mouth daily.   CITALOPRAM (CELEXA) 20 MG TABLET    Take one and a half tablet by mouth once daily.   COLLAGENASE POWD    by Does not apply route. Vital Protein Collagen Peptides 2 scoops into coffee daily   CYANOCOBALAMIN (VITAMIN B12) 1000 MCG TABLET    Take 1,000 mcg by mouth daily.   FOLIC ACID (FOLVITE) 902 MCG TABLET    Take 400 mcg by mouth daily.   IBUPROFEN (ADVIL) 800 MG TABLET    Take by mouth as needed.   METHOCARBAMOL (ROBAXIN) 500 MG TABLET    Take 500 mg by mouth daily as needed for muscle spasms.   MULTIPLE VITAMINS-MINERALS (ZINC PO)    Take 1 capsule by mouth daily.   OMEGA-3 FATTY ACIDS (OMEGA-3 PO)    Take 1 capsule by mouth daily.   PROBIOTIC PRODUCT (PROBIOTIC-10 PO)    Take 1  capsule by mouth 2 (two) times daily.   TRAZODONE (DESYREL) 50 MG TABLET    Take 1 tablet (50 mg total) by mouth at bedtime.   WHEAT DEXTRIN (BENEFIBER DRINK MIX PO)    Take 1 Package by mouth daily.  Modified Medications   No medications on file  Discontinued Medications   No medications on file    Physical Exam:  There were no vitals filed for this visit. There is no height or weight on file to calculate BMI. Wt Readings from Last 3 Encounters:  12/29/21 127 lb 9.6 oz (57.9 kg)  12/22/21 128 lb 3.2 oz (58.2 kg)  11/07/21 129 lb 3.2 oz (58.6 kg)    Physical Exam  Labs reviewed: Basic Metabolic Panel: Recent Labs    03/27/21 0915 03/27/21 0915 05/20/21 1026 11/04/21 1112 12/09/21 1046 12/22/21 1350 12/29/21 1630  NA 136   < > 137 136 137 136 135  K 4.2  --  4.0 4.2 4.1 4.7 4.2  CL 98  --  101 99 102 99 97*  CO2 24  --  '29 30 30 26 29  '$ GLUCOSE 85   < > 85 105* 98 88 101*  BUN 11   < > '11 11 11 14 13  '$ CREATININE 0.71   < > 0.64 0.61 0.70 0.56* 0.72  CALCIUM 10.7*  --  10.0 10.7* 11.1*  11.0* 10.2 10.4  TSH 1.400  --  1.34 1.70  --   --   --    < > = values in this interval not displayed.   Liver Function Tests: Recent Labs    03/27/21 0915 05/20/21 1026 11/04/21 1112 12/09/21 1046 12/22/21 1350  AST 28   < > '26 21 17  '$ ALT 18   < > '19 17 13  '$ ALKPHOS 58  --   --   --   --   BILITOT 0.5   < > 0.9 0.8 0.5  PROT 6.7   < > 6.8 6.5 6.9  ALBUMIN 5.0*  --   --   --   --    < > = values in this interval not displayed.   No results for input(s): "LIPASE", "AMYLASE" in the last 8760 hours. No results for input(s): "AMMONIA" in the last 8760 hours. CBC: Recent Labs    12/09/21 1046 12/22/21 1350 12/29/21 1630  WBC 3.3* 5.4 6.9  NEUTROABS 1,937 3,661 4,782  HGB 12.8 12.6 12.4  HCT 37.8 37.8 36.4  MCV 89.6 89.6 88.1  PLT 210 252 300   Lipid Panel: Recent Labs    05/20/21 1026 11/04/21 1112  CHOL 273* 294*  HDL 111 118  LDLCALC 143* 160*  TRIG 85 65   CHOLHDL 2.5 2.5   TSH: Recent Labs    03/27/21 0915 05/20/21 1026 11/04/21 1112  TSH 1.400 1.34 1.70   A1C: Lab Results  Component Value Date   HGBA1C 5.2 12/09/2021     Assessment/Plan 1. C. difficile colitis NOT responsive to vancomycin, completed 10 day course.  -continues on probiotic with electrolyte drinks for support - fidaxomicin (DIFICID) 200 MG TABS tablet; Take 1 tablet (200 mg total) by mouth 2 (two) times daily.  Dispense: 20 tablet; Refill: 0 -strict precautions for follow up/hospital given Mayce Noyes K. Harle Battiest  [facility] [phone]    Virtual Visit via telephone  I connected with patient on 01/20/22 at  8:20 AM EDT by telephone and verified that I am speaking with the correct person using two identifiers.  Location: Patient: home Provider: [facility]    I discussed the limitations, risks, security and privacy concerns of performing an evaluation and management service by telephone and the availability of in person appointments. I also discussed with the patient that there may be a patient responsible charge related to this service. The patient expressed understanding and agreed to proceed.   I discussed the assessment and treatment plan with the patient. The patient was provided an opportunity to ask questions and all were answered. The patient agreed with the plan and demonstrated an understanding of the instructions.   The patient was advised to call back or seek an in-person evaluation if the symptoms worsen or if the condition fails to improve as anticipated.  I provided 13 minutes of non-face-to-face time during this encounter.  Carlos American. Harle Battiest Avs printed and mailed

## 2022-01-22 ENCOUNTER — Telehealth: Payer: Self-pay

## 2022-01-22 NOTE — Telephone Encounter (Signed)
Mariah Romero called patient to confirm appointment for tomorrow and patient had inquired about whether she should keep appointment tomorrow for two week follow up and left message on clinical intake voicemail.

## 2022-01-22 NOTE — Telephone Encounter (Signed)
Yes lets have her reschedule this as we just did a telephone visit and changed medication- follow up in 1 month

## 2022-01-22 NOTE — Telephone Encounter (Signed)
Patient has been rescheduled.

## 2022-01-23 ENCOUNTER — Encounter: Payer: Medicare Other | Admitting: Nurse Practitioner

## 2022-01-26 NOTE — Progress Notes (Signed)
err

## 2022-02-06 ENCOUNTER — Telehealth: Payer: Self-pay | Admitting: *Deleted

## 2022-02-06 DIAGNOSIS — A0472 Enterocolitis due to Clostridium difficile, not specified as recurrent: Secondary | ICD-10-CM

## 2022-02-06 NOTE — Telephone Encounter (Signed)
Patient called and left message on clinical intake stating that her C-diff was no better and she wasn't feeling well.    Tried calling patient and she answered and stated that she was on the phone with the pharmacy and she would call back.  Awaiting call back.

## 2022-02-06 NOTE — Telephone Encounter (Signed)
Noted, it sounds like she needs a GI referral if no better.

## 2022-02-06 NOTE — Telephone Encounter (Signed)
Patient stated that she has not felt good since 6/12 since Tooth infection.   Bowel are not the watery stools as before. Temp 96.4. Feels achy and tired. Getting Depressed.   Please Advise.

## 2022-02-09 NOTE — Telephone Encounter (Signed)
Referral sent for GI, can we contact our referral coordinator to make sure she puts this in stat. Thank you

## 2022-02-10 ENCOUNTER — Telehealth: Payer: Self-pay | Admitting: *Deleted

## 2022-02-10 DIAGNOSIS — A0472 Enterocolitis due to Clostridium difficile, not specified as recurrent: Secondary | ICD-10-CM

## 2022-02-10 NOTE — Telephone Encounter (Signed)
Message sent to Teresa Palacios Medina   

## 2022-02-10 NOTE — Telephone Encounter (Signed)
Patient called and stated that she has seen [facility] Dr. Ronnette Juniper in the past. Stated that she would just like to go back to her instead of going to [place].  Requesting a referral to be placed for [facility]

## 2022-02-10 NOTE — Telephone Encounter (Signed)
Referral placed for GI at [facility], if she has seen them before she can go ahead and call to make appt.

## 2022-02-17 NOTE — Progress Notes (Unsigned)
Cardiology Office Note   Date:  02/19/2022   ID:  Mariah Romero, DOB Oct 02, 1950, MRN [id number]  PCP:  Lauree Chandler, NP    No chief complaint on file.  Mitral regurgitation  Wt Readings from Last 3 Encounters:  02/19/22 127 lb (57.6 kg)  12/29/21 127 lb 9.6 oz (57.9 kg)  12/22/21 128 lb 3.2 oz (58.2 kg)       History of Present Illness: Mariah Romero is a 71 y.o. female   Who has had palpitations.   She was living in [place].  She was seen years ago for palpitations and she had a negative w/u with a monitor.  She was told she had MR as well.  Her last echo was several years ago.  She was supposed to have one in [place]. But the move to [place] happened before she left.  She was told that she was more sensitive to skipped beats than most.   THe patient has had low BP for many years.  Regardless, the patient was started on atenolol for palpitations.  She was told that she could take extra atenolol if beeded for palpitations.     She had more palpitations and saw me in April 2018.  We stopped her beta blocker.    After that, her energy improved, but she felt more skipped beats.  They typically occurred at night.  It took about a month to adjust to being off atenolol.  She was happy to be off of this.  She tried to cut down on the trazodone.    Her mother with Alzheimers moved in to her home.  This has been a source of stress and she has had increased palpitations.  She has a window to see if palpitations decrease with her mother visiting her sister.  She will likely be having an MRI so wants to wait on the monitor until after that MRI.    In 2023, still feels that she has a lot of stress.  Mom is now spending time with other daughter so stress is better.    BP has increased.   She had a tooth infection, then took ibuprofen. THen had diverticulitis. THen another antibiotic was started. Then ended up having CDiff.  Treated with oral vancomycin.    Denies : Chest pain.  Dizziness. Leg edema. Nitroglycerin use. Orthopnea. Palpitations. Paroxysmal nocturnal dyspnea. Shortness of breath. Syncope.     Past Medical History:  Diagnosis Date   Anxiety and depression    Breast cancer ([place]) 2001   RIGHT SIDE. S/P CHEMOTHERAPY   COVID 11/29/2020   Headache    Mitral valve regurgitation    Osteoporosis    Personal history of chemotherapy    Personal history of radiation therapy     Past Surgical History:  Procedure Laterality Date   APPENDECTOMY     AT AGE 46 YEARS OLD   BREAST LUMPECTOMY Right 2001   AT AGE 17 YEARS OLD   CHOLECYSTECTOMY     GALLBLADDER SURGERY  2012     Current Outpatient Medications  Medication Sig Dispense Refill   aspirin EC 81 MG tablet Take 1 tablet (81 mg total) by mouth daily. Swallow whole. 30 tablet 12   b complex vitamins capsule Take 1 capsule by mouth daily.     Biotin 10 MG CAPS Take by mouth.     calcium citrate (CALCITRATE - DOSED IN MG ELEMENTAL CALCIUM) 950 (200 Ca) MG tablet Take 200 mg of elemental  calcium by mouth daily.     cholecalciferol (VITAMIN D INFANT) 10 MCG/ML LIQD Take 400 Units by mouth daily.     citalopram (CELEXA) 20 MG tablet Take one and a half tablet by mouth once daily. 135 tablet 1   Collagenase POWD by Does not apply route. Vital Protein Collagen Peptides 2 scoops into coffee daily     cyanocobalamin (VITAMIN B12) 1000 MCG tablet Take 1,000 mcg by mouth daily.     folic acid (FOLVITE) 371 MCG tablet Take 400 mcg by mouth daily.     ibuprofen (ADVIL) 800 MG tablet Take by mouth as needed.     methocarbamol (ROBAXIN) 500 MG tablet Take 500 mg by mouth daily as needed for muscle spasms.     Multiple Vitamins-Minerals (ZINC PO) Take 1 capsule by mouth daily.     Omega-3 Fatty Acids (OMEGA-3 PO) Take 1 capsule by mouth daily.     Probiotic Product (PROBIOTIC-10 PO) Take 1 capsule by mouth 2 (two) times daily.     traZODone (DESYREL) 50 MG tablet Take 1 tablet (50 mg total) by mouth at bedtime. 90  tablet 1   Wheat Dextrin (BENEFIBER DRINK MIX PO) Take 1 Package by mouth daily.     atorvastatin (LIPITOR) 10 MG tablet Take 1 tablet (10 mg total) by mouth daily. (Patient not taking: Reported on 01/08/2022) 30 tablet 11   No current facility-administered medications for this visit.    Allergies:   Codeine, Other, Oxycodone, Sulfa antibiotics, Ibuprofen, and Penicillins    Social History:  The patient  reports that she has never smoked. She has never used smokeless tobacco. She reports current alcohol use. She reports that she does not use drugs.   Family History:  The patient's family history includes Asthma in her sister; Breast cancer in her sister; CVA in her father; Dementia (age of onset: 58) in her mother; Diabetes in her mother; Emphysema in her paternal grandmother; Hypertension in her mother; Melanoma in her father; Mental illness in her father.    ROS:  Please see the history of present illness.   Otherwise, review of systems are positive for stress- improved.   All other systems are reviewed and negative.    PHYSICAL EXAM: VS:  BP (!) 154/70   Pulse 61   Ht '5\' 4"'$  (1.626 m)   Wt 127 lb (57.6 kg)   SpO2 97%   BMI 21.80 kg/m  , BMI Body mass index is 21.8 kg/m. GEN: Well nourished, well developed, in no acute distress HEENT: normal Neck: no JVD, carotid bruits, or masses Cardiac: RRR; no murmurs, rubs, or gallops,no edema  Respiratory:  clear to auscultation bilaterally, normal work of breathing GI: soft, nontender, nondistended, + BS MS: no deformity or atrophy Skin: warm and dry, no rash Neuro:  Strength and sensation are intact Psych: euthymic mood, full affect   EKG:   The ekg ordered today demonstrates NSR, RBBB   Recent Labs: 11/04/2021: TSH 1.70 12/22/2021: ALT 13 12/29/2021: BUN 13; Creat 0.72; Hemoglobin 12.4; Platelets 300; Potassium 4.2; Sodium 135   Lipid Panel    Component Value Date/Time   CHOL 294 (H) 11/04/2021 1112   TRIG 65 11/04/2021 1112    HDL 118 11/04/2021 1112   CHOLHDL 2.5 11/04/2021 1112   LDLCALC 160 (H) 11/04/2021 1112     Other studies Reviewed: Additional studies/ records that were reviewed today with results demonstrating: labs reviewed.    ASSESSMENT AND PLAN:  Mitral regurgitation: No CHF.  Stressed the importance of the BP control to reduce the likelihood of MR.  Elevated blood pressure: Not walking much.  Planning on increasing exercise.  Hoping to control blood pressures with lifestyle changes.  Her diet is healthy.  We talked about low-salt diet and a more plant-based diet.  She is hoping to avoid blood pressure lowering medicines.  BPs have been more in the 130s-150s at times.   Palpitations: Controlled.  DOE/fatigue: some deconditioning after her Mom moved, due to less exercise.  Hyperlipidemia: Atherosclerosis is an indication to start a statin.  Was recommended to start statin.  She can try CoQ10 100-200 mg daily.  Will start atorvastatin that she got from her primary care doctor.  Will also check calcium score CT scan.    Current medicines are reviewed at length with the patient today.  The patient concerns regarding her medicines were addressed.  The following changes have been made:  No change  Labs/ tests ordered today include:  No orders of the defined types were placed in this encounter.   Recommend 150 minutes/week of aerobic exercise Low fat, low carb, high fiber diet recommended  Disposition:   FU in 1 year   Signed, Larae Grooms, MD  02/19/2022 2:20 PM    [facility] [place], [place], [place]  [address] Phone: [phone]; Fax: [phone]

## 2022-02-19 ENCOUNTER — Encounter: Payer: Self-pay | Admitting: Interventional Cardiology

## 2022-02-19 ENCOUNTER — Ambulatory Visit: Payer: Medicare Other | Attending: Interventional Cardiology | Admitting: Interventional Cardiology

## 2022-02-19 VITALS — BP 154/70 | HR 61 | Ht 64.0 in | Wt 127.0 lb

## 2022-02-19 DIAGNOSIS — I340 Nonrheumatic mitral (valve) insufficiency: Secondary | ICD-10-CM | POA: Diagnosis not present

## 2022-02-19 DIAGNOSIS — I700 Atherosclerosis of aorta: Secondary | ICD-10-CM | POA: Diagnosis not present

## 2022-02-19 DIAGNOSIS — R002 Palpitations: Secondary | ICD-10-CM

## 2022-02-19 NOTE — Patient Instructions (Addendum)
Medication Instructions:  Your physician recommends that you continue on your current medications as directed. Please refer to the Current Medication list given to you today.  *If you need a refill on your cardiac medications before your next appointment, please call your pharmacy*  Tests: Your physician has requested that you have a calcium score CT scan. There is a $99 fee for the scan.    Follow-Up: At [facility], you and your health needs are our priority.  As part of our continuing mission to provide you with exceptional heart care, we have created designated Provider Care Teams.  These Care Teams include your primary Cardiologist (physician) and Advanced Practice Providers (APPs -  Physician Assistants and Nurse Practitioners) who all work together to provide you with the care you need, when you need it.   Your next appointment:   1 year(s)  The format for your next appointment:   In Person  Provider:   Larae Grooms, MD      Important Information About Sugar

## 2022-02-20 ENCOUNTER — Encounter: Payer: Self-pay | Admitting: Nurse Practitioner

## 2022-02-20 ENCOUNTER — Ambulatory Visit (INDEPENDENT_AMBULATORY_CARE_PROVIDER_SITE_OTHER): Payer: Medicare Other | Admitting: Nurse Practitioner

## 2022-02-20 VITALS — BP 124/70 | HR 63 | Temp 97.0°F | Resp 16 | Ht 64.0 in | Wt 128.0 lb

## 2022-02-20 DIAGNOSIS — E785 Hyperlipidemia, unspecified: Secondary | ICD-10-CM

## 2022-02-20 DIAGNOSIS — Z23 Encounter for immunization: Secondary | ICD-10-CM

## 2022-02-20 DIAGNOSIS — I10 Essential (primary) hypertension: Secondary | ICD-10-CM

## 2022-02-20 DIAGNOSIS — M810 Age-related osteoporosis without current pathological fracture: Secondary | ICD-10-CM

## 2022-02-20 DIAGNOSIS — I700 Atherosclerosis of aorta: Secondary | ICD-10-CM

## 2022-02-20 DIAGNOSIS — F339 Major depressive disorder, recurrent, unspecified: Secondary | ICD-10-CM

## 2022-02-20 DIAGNOSIS — A0472 Enterocolitis due to Clostridium difficile, not specified as recurrent: Secondary | ICD-10-CM

## 2022-02-20 MED ORDER — CITALOPRAM HYDROBROMIDE 20 MG PO TABS: 40.0000 mg | ORAL_TABLET | Freq: Every day | ORAL | 1 refills | Status: DC

## 2022-02-20 NOTE — Patient Instructions (Signed)
Please sign a record release for gastroenterology (colonoscopy)  on check out.

## 2022-02-20 NOTE — Progress Notes (Signed)
Careteam: Patient Care Team: Lauree Chandler, NP as PCP - General (Geriatric Medicine) [name], MD as PCP - Cardiology (Cardiology) Marcial Pacas, MD as Consulting Physician (Neurology)  PLACE OF SERVICE:  [facility] Directive information Does Patient Have a Medical Advance Directive?: No, Would patient like information on creating a medical advance directive?: No - Patient declined  Allergies  Allergen Reactions   Codeine     RINGING IN THE EARS.    Other Other (See Comments)    PAIN KILLERS   Oxycodone Itching   Sulfa Antibiotics Other (See Comments)   Ibuprofen Rash   Penicillins Swelling and Rash    Chief Complaint  Patient presents with   Medical Management of Chronic Issues    1 month follow up.    Health Maintenance    Discuss the need for Colonoscopy.    Immunizations    Discuss the need for Covid Booster, and Influenza vaccine.      HPI: Patient is a 71 y.o. female for follow up.   C diff- she took dificid and eventually she had a formed stool. It took a long time before she had releif. Started by infection in the jaw and diverticulitis.   She takes care of her mother who has dementia- she has her for 3 months then her sisters will take her.   MVR and palpitations followed by Dr Conception Oms She has not taking lipitor but cardiologist recommended she start.  She plans to start taking tonight.   Depression- on celexa talked about weaning off but then due to stressors has continued.  She had weaned down from 2 celexa to 1.5 celexa but recently went up to 2 celexa- 40 mg  She started seeing therapist in august. Found her self getting sad. A lot of dysfunctional ppl in her family and emotional immature.  She is now grieving for herself looking at her past.    Insomnia- managed by trazodone.   Hx of breast cancer- lumpectomy done in 2001.   Osteoporosis- on cal and vit d, does not wish to take medicaiton.   Review of Systems:    Review of Systems  Constitutional:  Negative for chills, fever and weight loss.  HENT:  Negative for tinnitus.   Respiratory:  Negative for cough, sputum production and shortness of breath.   Cardiovascular:  Negative for chest pain, palpitations and leg swelling.  Gastrointestinal:  Negative for abdominal pain, constipation, diarrhea and heartburn.  Genitourinary:  Negative for dysuria, frequency and urgency.  Musculoskeletal:  Negative for back pain, falls, joint pain and myalgias.  Skin: Negative.   Neurological:  Negative for dizziness and headaches.  Psychiatric/Behavioral:  Positive for depression. Negative for memory loss. The patient is nervous/anxious. The patient does not have insomnia.     Past Medical History:  Diagnosis Date   Anxiety and depression    Breast cancer ([place]) 2001   RIGHT SIDE. S/P CHEMOTHERAPY   COVID 11/29/2020   Headache    Mitral valve regurgitation    Osteoporosis    Personal history of chemotherapy    Personal history of radiation therapy    Past Surgical History:  Procedure Laterality Date   APPENDECTOMY     AT AGE 61 YEARS OLD   BREAST LUMPECTOMY Right 2001   AT AGE 89 YEARS OLD   CHOLECYSTECTOMY     GALLBLADDER SURGERY  2012   Social History:   reports that she has never smoked. She has never used  smokeless tobacco. She reports current alcohol use. She reports that she does not use drugs.  Family History  Problem Relation Age of Onset   Dementia Mother 61   Diabetes Mother    Hypertension Mother    Melanoma Father    Mental illness Father    CVA Father    Breast cancer Sister    Emphysema Paternal Grandmother        never smoker   Asthma Sister        had asthma as a child    Medications: Patient's Medications  New Prescriptions   No medications on file  Previous Medications   ASPIRIN EC 81 MG TABLET    Take 1 tablet (81 mg total) by mouth daily. Swallow whole.   ATORVASTATIN (LIPITOR) 10 MG TABLET    Take 1 tablet (10  mg total) by mouth daily.   B COMPLEX VITAMINS CAPSULE    Take 1 capsule by mouth daily.   BIOTIN 10 MG CAPS    Take by mouth.   CALCIUM CITRATE (CALCITRATE - DOSED IN MG ELEMENTAL CALCIUM) 950 (200 CA) MG TABLET    Take 200 mg of elemental calcium by mouth daily.   CHOLECALCIFEROL (VITAMIN D INFANT) 10 MCG/ML LIQD    Take 400 Units by mouth daily.   CITALOPRAM (CELEXA) 20 MG TABLET    Take one and a half tablet by mouth once daily.   COLLAGENASE POWD    by Does not apply route. Vital Protein Collagen Peptides 2 scoops into coffee daily   CYANOCOBALAMIN (VITAMIN B12) 1000 MCG TABLET    Take 1,000 mcg by mouth daily.   FOLIC ACID (FOLVITE) 623 MCG TABLET    Take 400 mcg by mouth daily.   IBUPROFEN (ADVIL) 800 MG TABLET    Take by mouth as needed.   METHOCARBAMOL (ROBAXIN) 500 MG TABLET    Take 500 mg by mouth daily as needed for muscle spasms.   MULTIPLE VITAMINS-MINERALS (ZINC PO)    Take 1 capsule by mouth daily.   OMEGA-3 FATTY ACIDS (OMEGA-3 PO)    Take 1 capsule by mouth daily.   ORAL ELECTROLYTES (LIQUID I.V. PO)    Take by mouth as needed (dehydration).   PROBIOTIC PRODUCT (PROBIOTIC-10 PO)    Take 1 capsule by mouth 2 (two) times daily.   TRAZODONE (DESYREL) 50 MG TABLET    Take 1 tablet (50 mg total) by mouth at bedtime.   WHEAT DEXTRIN (BENEFIBER DRINK MIX PO)    Take 1 Package by mouth daily.  Modified Medications   No medications on file  Discontinued Medications   No medications on file    Physical Exam:  Vitals:   02/20/22 1408  BP: 124/70  Pulse: 63  Resp: 16  Temp: (!) 97 F (36.1 C)  SpO2: 97%  Weight: 128 lb (58.1 kg)  Height: '5\' 4"'  (1.626 m)   Body mass index is 21.97 kg/m. Wt Readings from Last 3 Encounters:  02/20/22 128 lb (58.1 kg)  02/19/22 127 lb (57.6 kg)  12/29/21 127 lb 9.6 oz (57.9 kg)    Physical Exam Constitutional:      General: She is not in acute distress.    Appearance: She is well-developed. She is not diaphoretic.  HENT:     Head:  Normocephalic and atraumatic.     Mouth/Throat:     Pharynx: No oropharyngeal exudate.  Eyes:     Conjunctiva/sclera: Conjunctivae normal.     Pupils: Pupils are equal,  round, and reactive to light.  Cardiovascular:     Rate and Rhythm: Normal rate and regular rhythm.     Heart sounds: Normal heart sounds.  Pulmonary:     Effort: Pulmonary effort is normal.     Breath sounds: Normal breath sounds.  Abdominal:     General: Bowel sounds are normal.     Palpations: Abdomen is soft.  Musculoskeletal:     Cervical back: Normal range of motion and neck supple.     Right lower leg: No edema.     Left lower leg: No edema.  Skin:    General: Skin is warm and dry.  Neurological:     Mental Status: She is alert.  Psychiatric:        Mood and Affect: Mood normal.     Labs reviewed: Basic Metabolic Panel: Recent Labs    03/27/21 0915 03/27/21 0915 05/20/21 1026 11/04/21 1112 12/09/21 1046 12/22/21 1350 12/29/21 1630  NA 136   < > 137 136 137 136 135  K 4.2  --  4.0 4.2 4.1 4.7 4.2  CL 98  --  101 99 102 99 97*  CO2 24  --  '29 30 30 26 29  ' GLUCOSE 85   < > 85 105* 98 88 101*  BUN 11   < > '11 11 11 14 13  ' CREATININE 0.71   < > 0.64 0.61 0.70 0.56* 0.72  CALCIUM 10.7*  --  10.0 10.7* 11.1*  11.0* 10.2 10.4  TSH 1.400  --  1.34 1.70  --   --   --    < > = values in this interval not displayed.   Liver Function Tests: Recent Labs    03/27/21 0915 05/20/21 1026 11/04/21 1112 12/09/21 1046 12/22/21 1350  AST 28   < > '26 21 17  ' ALT 18   < > '19 17 13  ' ALKPHOS 58  --   --   --   --   BILITOT 0.5   < > 0.9 0.8 0.5  PROT 6.7   < > 6.8 6.5 6.9  ALBUMIN 5.0*  --   --   --   --    < > = values in this interval not displayed.   No results for input(s): "LIPASE", "AMYLASE" in the last 8760 hours. No results for input(s): "AMMONIA" in the last 8760 hours. CBC: Recent Labs    12/09/21 1046 12/22/21 1350 12/29/21 1630  WBC 3.3* 5.4 6.9  NEUTROABS 1,937 3,661 4,782  HGB  12.8 12.6 12.4  HCT 37.8 37.8 36.4  MCV 89.6 89.6 88.1  PLT 210 252 300   Lipid Panel: Recent Labs    05/20/21 1026 11/04/21 1112  CHOL 273* 294*  HDL 111 118  LDLCALC 143* 160*  TRIG 85 65  CHOLHDL 2.5 2.5   TSH: Recent Labs    03/27/21 0915 05/20/21 1026 11/04/21 1112  TSH 1.400 1.34 1.70   A1C: Lab Results  Component Value Date   HGBA1C 5.2 12/09/2021     Assessment/Plan 1. Need for influenza vaccination - Flu Vaccine QUAD High Dose(Fluad)  2. Aortic atherosclerosis ([place]) -continues on asa, plans to start statin.   3. Hyperlipidemia LDL goal <70 -to start on lipitor -follow up cmp and lipids prior to next visit -continue dietary modifications.   4. Osteoporosis without current pathological fracture, unspecified osteoporosis type Does not wish to have medication -started cal and vit d -plans to start weight bearing exercises.  5. Primary hypertension -Blood pressure well controlled, goal bp <140/90 Continue current medications and dietary modifications follow metabolic panel - CMP with eGFR([facility]) - CBC with Differential/Platelet  6. C. difficile colitis -resolved at this time.   7. Episode of recurrent major depressive disorder, unspecified depression episode severity ([place]) -ongoing, has increased celexa back to 40 mg daily and seeing therapist to help with management.  - citalopram (CELEXA) 20 MG tablet; Take 2 tablets (40 mg total) by mouth daily.  Dispense: 180 tablet; Refill: 1   Return in about 3 months (around 05/22/2022) for routine follow up . Carlos American. [place], [facility] [phone]

## 2022-02-21 LAB — CBC WITH DIFFERENTIAL/PLATELET
Absolute Monocytes: 511 cells/uL (ref 200–950)
Basophils Absolute: 32 cells/uL (ref 0–200)
Basophils Relative: 0.7 %
Eosinophils Absolute: 78 cells/uL (ref 15–500)
Eosinophils Relative: 1.7 %
HCT: 40.6 % (ref 35.0–45.0)
Hemoglobin: 13.4 g/dL (ref 11.7–15.5)
Lymphs Abs: 1063 cells/uL (ref 850–3900)
MCH: 29.4 pg (ref 27.0–33.0)
MCHC: 33 g/dL (ref 32.0–36.0)
MCV: 89 fL (ref 80.0–100.0)
MPV: 9.9 fL (ref 7.5–12.5)
Monocytes Relative: 11.1 %
Neutro Abs: 2916 cells/uL (ref 1500–7800)
Neutrophils Relative %: 63.4 %
Platelets: 245 10*3/uL (ref 140–400)
RBC: 4.56 10*6/uL (ref 3.80–5.10)
RDW: 12.7 % (ref 11.0–15.0)
Total Lymphocyte: 23.1 %
WBC: 4.6 10*3/uL (ref 3.8–10.8)

## 2022-02-21 LAB — COMPLETE METABOLIC PANEL WITH GFR
AG Ratio: 1.7 (calc) (ref 1.0–2.5)
ALT: 21 U/L (ref 6–29)
AST: 25 U/L (ref 10–35)
Albumin: 4.4 g/dL (ref 3.6–5.1)
Alkaline phosphatase (APISO): 52 U/L (ref 37–153)
BUN: 16 mg/dL (ref 7–25)
CO2: 31 mmol/L (ref 20–32)
Calcium: 10.7 mg/dL — ABNORMAL HIGH (ref 8.6–10.4)
Chloride: 97 mmol/L — ABNORMAL LOW (ref 98–110)
Creat: 0.6 mg/dL (ref 0.60–1.00)
Globulin: 2.6 g/dL (calc) (ref 1.9–3.7)
Glucose, Bld: 86 mg/dL (ref 65–139)
Potassium: 4.5 mmol/L (ref 3.5–5.3)
Sodium: 135 mmol/L (ref 135–146)
Total Bilirubin: 0.8 mg/dL (ref 0.2–1.2)
Total Protein: 7 g/dL (ref 6.1–8.1)
eGFR: 97 mL/min/{1.73_m2} (ref >=60)

## 2022-03-18 ENCOUNTER — Encounter: Payer: Self-pay | Admitting: Adult Health

## 2022-03-18 ENCOUNTER — Ambulatory Visit (INDEPENDENT_AMBULATORY_CARE_PROVIDER_SITE_OTHER): Payer: Medicare Other | Admitting: Adult Health

## 2022-03-18 DIAGNOSIS — K5792 Diverticulitis of intestine, part unspecified, without perforation or abscess without bleeding: Secondary | ICD-10-CM | POA: Diagnosis not present

## 2022-03-18 MED ORDER — CIPROFLOXACIN HCL 500 MG PO TABS: 500.0000 mg | ORAL_TABLET | Freq: Two times a day (BID) | ORAL | 0 refills | Status: AC

## 2022-03-18 MED ORDER — SACCHAROMYCES BOULARDII 250 MG PO CAPS: 250.0000 mg | ORAL_CAPSULE | Freq: Two times a day (BID) | ORAL | 0 refills | Status: DC

## 2022-03-18 NOTE — Patient Instructions (Signed)
She has been instructed to take cipro 500 mg twice daily for 14 days; and florastor twice for 50 doses

## 2022-03-18 NOTE — Progress Notes (Signed)
   This service is provided via telemedicine  No vital signs collected/recorded due to the encounter was a telemedicine visit.   Location of patient (ex: home, work):  Home  Patient consents to a telephone visit: Yes, see telephone visit dated 01/08/2022  Location of the provider (ex: office, home):  [facility], Office   Name of any referring provider:  N/A  Names of all persons participating in the telemedicine service and their role in the encounter:  S.Chrae B/CMA, Bard Herbert, NP, and Patient   Time spent on call:  9 min with medical assistant     HPI  I have spoken with her on the phone discussing her concerns. For the past couple of days she has had increased abdominal pain and loose stools. There are no reports of fevers present. She is able to eat and drink. She has had this in the past. She took flagyl which did not help; then took cipro. She then developed c-diff. She is currently on a probiotic daily and does eat yogurt daily. She is wanting to start on PCN; however; she is allergic and cannot take.   No physical exam able to be performed    ROS: negative except what is in HPI   PLAN  Acute divertiuclitis:   Will begin on cipro 500 mg twice daily for 2 weeks Will take florastor twice daily for 50 caps Will have her return to clinic if she does not get better     My time on the phone was 13 minutes  Ok Edwards NP [facility]   call [phone]

## 2022-03-25 ENCOUNTER — Encounter: Payer: Self-pay | Admitting: Family

## 2022-03-25 ENCOUNTER — Encounter: Payer: Medicare Other | Admitting: Family

## 2022-03-25 NOTE — Progress Notes (Signed)
  This encounter was created in error - please disregard. No show 

## 2022-03-26 ENCOUNTER — Encounter: Payer: Self-pay | Admitting: Family

## 2022-03-26 ENCOUNTER — Encounter (HOSPITAL_BASED_OUTPATIENT_CLINIC_OR_DEPARTMENT_OTHER)
Admission: RE | Admit: 2022-03-26 | Discharge: 2022-03-26 | Disposition: A | Discharge status: Home / Self Care | Payer: Medicare Other | Source: Ambulatory Visit | Attending: Interventional Cardiology | Admitting: Interventional Cardiology

## 2022-03-26 ENCOUNTER — Ambulatory Visit: Payer: Medicare Other | Admitting: Family

## 2022-03-26 VITALS — BP 116/70 | HR 77 | Temp 95.8°F | Resp 20 | Ht 64.0 in | Wt 127.6 lb

## 2022-03-26 DIAGNOSIS — R220 Localized swelling, mass and lump, head: Secondary | ICD-10-CM | POA: Diagnosis not present

## 2022-03-26 DIAGNOSIS — I700 Atherosclerosis of aorta: Secondary | ICD-10-CM | POA: Insufficient documentation

## 2022-03-26 DIAGNOSIS — R002 Palpitations: Secondary | ICD-10-CM | POA: Insufficient documentation

## 2022-03-26 DIAGNOSIS — K5792 Diverticulitis of intestine, part unspecified, without perforation or abscess without bleeding: Secondary | ICD-10-CM | POA: Diagnosis not present

## 2022-03-26 DIAGNOSIS — I340 Nonrheumatic mitral (valve) insufficiency: Secondary | ICD-10-CM | POA: Insufficient documentation

## 2022-03-26 LAB — CBC WITH DIFFERENTIAL/PLATELET
Absolute Monocytes: 532 cells/uL (ref 200–950)
Basophils Absolute: 32 cells/uL (ref 0–200)
Basophils Relative: 0.8 %
Eosinophils Absolute: 120 cells/uL (ref 15–500)
Eosinophils Relative: 3 %
HCT: 38.4 % (ref 35.0–45.0)
Hemoglobin: 13 g/dL (ref 11.7–15.5)
Lymphs Abs: 944 cells/uL (ref 850–3900)
MCH: 30 pg (ref 27.0–33.0)
MCHC: 33.9 g/dL (ref 32.0–36.0)
MCV: 88.5 fL (ref 80.0–100.0)
MPV: 9.5 fL (ref 7.5–12.5)
Monocytes Relative: 13.3 %
Neutro Abs: 2372 cells/uL (ref 1500–7800)
Neutrophils Relative %: 59.3 %
Platelets: 284 10*3/uL (ref 140–400)
RBC: 4.34 10*6/uL (ref 3.80–5.10)
RDW: 12.9 % (ref 11.0–15.0)
Total Lymphocyte: 23.6 %
WBC: 4 10*3/uL (ref 3.8–10.8)

## 2022-03-26 NOTE — Patient Instructions (Signed)
Apply heat and alternate with ice compressor to right side of face for 10-20 minutes daily to relief swelling and pain - Notify provider if symptoms worsen

## 2022-03-26 NOTE — Progress Notes (Signed)
Provider: Keron Neenan FNP-C  Lauree Chandler, NP  Patient Care Team: Lauree Chandler, NP as PCP - General (Geriatric Medicine) [name], MD as PCP - Cardiology (Cardiology) Marcial Pacas, MD as Consulting Physician (Neurology)  Extended Emergency Contact Information Primary Emergency Contact: [name] [place] of [place] Phone: [phone] Relation: Significant other Secondary Emergency Contact: [place] Phone: [phone] Relation: Son  Code Status:  Full Code  Goals of care: Advanced Directive information    02/20/2022    2:13 PM  Advanced Directives  Does Patient Have a Medical Advance Directive? No  Would patient like information on creating a medical advance directive? No - Patient declined     Chief Complaint  Patient presents with   Acute Visit    Patient presents today for right facial swelling and upper abdominal pain for a few days now.    HPI:  Pt is a 71 y.o. female seen today for an acute visit for evaluation of right facial swelling and upper abdominal pain x 3 days.states swelling was painful yesterday when eating.Took Advil.mainly in the cheek and Jaw area.  Also has upper abdominal pain from 03/24/2022 - 03/25/2022 had diverticulitis currently Cipro twice daily. taking Rolaids for mid upper ABD pain.     Past Medical History:  Diagnosis Date   Anxiety and depression    Breast cancer ([place]) 2001   RIGHT SIDE. S/P CHEMOTHERAPY   COVID 11/29/2020   Headache    Mitral valve regurgitation    Osteoporosis    Personal history of chemotherapy    Personal history of radiation therapy    Past Surgical History:  Procedure Laterality Date   APPENDECTOMY     AT AGE 79 YEARS OLD   BREAST LUMPECTOMY Right 2001   AT AGE 18 YEARS OLD   CHOLECYSTECTOMY     GALLBLADDER SURGERY  2012    Allergies  Allergen Reactions   Codeine     RINGING IN THE EARS.    Other Other (See Comments)    PAIN KILLERS    Oxycodone Itching   Sulfa Antibiotics Other (See Comments)   Ibuprofen Rash   Penicillins Swelling and Rash    Outpatient Encounter Medications as of 03/26/2022  Medication Sig   aspirin EC 81 MG tablet Take 1 tablet (81 mg total) by mouth daily. Swallow whole.   atorvastatin (LIPITOR) 10 MG tablet Take 1 tablet (10 mg total) by mouth daily.   b complex vitamins capsule Take 1 capsule by mouth daily.   Biotin 10 MG CAPS Take by mouth.   calcium citrate (CALCITRATE - DOSED IN MG ELEMENTAL CALCIUM) 950 (200 Ca) MG tablet Take 200 mg of elemental calcium by mouth daily.   cholecalciferol (VITAMIN D INFANT) 10 MCG/ML LIQD Take 400 Units by mouth daily.   ciprofloxacin (CIPRO) 500 MG tablet Take 1 tablet (500 mg total) by mouth 2 (two) times daily for 14 days.   citalopram (CELEXA) 20 MG tablet Take 2 tablets (40 mg total) by mouth daily.   Collagenase POWD by Does not apply route. Vital Protein Collagen Peptides 2 scoops into coffee daily   cyanocobalamin (VITAMIN B12) 1000 MCG tablet Take 1,000 mcg by mouth daily.   folic acid (FOLVITE) 539 MCG tablet Take 400 mcg by mouth daily.   ibuprofen (ADVIL) 800 MG tablet Take by mouth as needed.   methocarbamol (ROBAXIN) 500 MG tablet Take 500 mg by mouth daily as needed for muscle spasms.   Multiple Vitamins-Minerals (  ZINC PO) Take 1 capsule by mouth daily.   Omega-3 Fatty Acids (OMEGA-3 PO) Take 1 capsule by mouth daily.   Oral Electrolytes (LIQUID I.V. PO) Take by mouth as needed (dehydration).   Probiotic Product (PROBIOTIC-10 PO) Take 1 capsule by mouth 2 (two) times daily.   saccharomyces boulardii (FLORASTOR) 250 MG capsule Take 1 capsule (250 mg total) by mouth 2 (two) times daily.   traZODone (DESYREL) 50 MG tablet Take 1 tablet (50 mg total) by mouth at bedtime.   Wheat Dextrin (BENEFIBER DRINK MIX PO) Take 1 Package by mouth daily.   No facility-administered encounter medications on file as of 03/26/2022.    Review of Systems   Constitutional:  Negative for appetite change, chills, fatigue, fever and unexpected weight change.  HENT:  Negative for congestion, dental problem, ear discharge, ear pain, hearing loss, nosebleeds, postnasal drip, rhinorrhea, sinus pressure, sinus pain, sneezing, sore throat, tinnitus and trouble swallowing.        Reports right-sided facial swelling and tenderness few days ago.  Eyes:  Negative for pain, discharge, redness, itching and visual disturbance.  Respiratory:  Negative for cough, chest tightness, shortness of breath and wheezing.   Cardiovascular:  Negative for chest pain, palpitations and leg swelling.  Gastrointestinal:  Negative for abdominal distention, abdominal pain, blood in stool, constipation, nausea and vomiting.       Currently on Cipro twice daily for diverticulitis  Genitourinary:  Negative for difficulty urinating, dysuria, flank pain, frequency and urgency.  Musculoskeletal:  Negative for arthralgias, back pain, gait problem, joint swelling, myalgias, neck pain and neck stiffness.  Skin:  Negative for color change, pallor, rash and wound.  Neurological:  Negative for dizziness, syncope, speech difficulty, weakness, light-headedness, numbness and headaches.  Hematological:  Does not bruise/bleed easily.  Psychiatric/Behavioral:  Negative for agitation, behavioral problems, confusion, hallucinations, self-injury, sleep disturbance and suicidal ideas. The patient is not nervous/anxious.     Immunization History  Administered Date(s) Administered   Fluad Quad(high Dose 65+) 02/20/2022   Influenza Split 03/09/2019   Influenza, High Dose Seasonal PF 03/10/2018, 04/04/2020   PFIZER(Purple Top)SARS-COV-2 Vaccination 07/31/2019, 08/29/2019, 05/13/2020   Pneumococcal Conjugate-13 12/19/2015   Pneumococcal Polysaccharide-23 03/29/2001, 03/09/2019   Td 03/09/2019   Tdap 03/14/2008, 03/09/2019   Zoster Recombinat (Shingrix) 04/04/2020, 11/08/2020   Zoster, Live 07/29/2012    Pertinent  Health Maintenance Due  Topic Date Due   COLONOSCOPY (Pts 45-26yr Insurance coverage will need to be confirmed)  Never done   MAMMOGRAM  01/04/2023   INFLUENZA VACCINE  Completed   DEXA SCAN  Completed      05/27/2021    2:36 PM 05/28/2021    3:33 PM 12/22/2021    1:21 PM 01/08/2022   12:35 PM 02/20/2022    2:13 PM  Fall Risk  Falls in the past year? 0 0 0 0 0  Was there an injury with Fall? 0 0 0 0 0  Fall Risk Category Calculator 0 0 0 0 0  Fall Risk Category Low Low Low Low Low  Patient Fall Risk Level Low fall risk Low fall risk Low fall risk Low fall risk Low fall risk  Patient at Risk for Falls Due to No Fall Risks No Fall Risks No Fall Risks No Fall Risks No Fall Risks  Fall risk Follow up Falls evaluation completed Falls evaluation completed Falls evaluation completed Falls evaluation completed Falls evaluation completed   Functional Status Survey:    Vitals:   03/26/22 1413  BP: 116/70  Pulse: 77  Resp: 20  Temp: (!) 95.8 F (35.4 C)  SpO2: 98%  Weight: 127 lb 9.6 oz (57.9 kg)  Height: '5\' 4"'$  (1.626 m)   Body mass index is 21.9 kg/m. Physical Exam Vitals reviewed.  Constitutional:      General: She is not in acute distress.    Appearance: Normal appearance. She is normal weight. She is not ill-appearing or diaphoretic.  HENT:     Head: Normocephalic.     Right Ear: Tympanic membrane, ear canal and external ear normal. There is no impacted cerumen.     Left Ear: Tympanic membrane, ear canal and external ear normal. There is no impacted cerumen.     Ears:     Comments: Right-sided jaw slightly tender to palpation but no erythema, swelling or discharge.    Nose: Nose normal. No congestion or rhinorrhea.     Mouth/Throat:     Mouth: Mucous membranes are moist.     Pharynx: Oropharynx is clear. No oropharyngeal exudate or posterior oropharyngeal erythema.  Eyes:     General: No scleral icterus.       Right eye: No discharge.        Left eye: No  discharge.     Conjunctiva/sclera: Conjunctivae normal.     Pupils: Pupils are equal, round, and reactive to light.  Neck:     Vascular: No carotid bruit.  Cardiovascular:     Rate and Rhythm: Normal rate and regular rhythm.     Pulses: Normal pulses.     Heart sounds: Normal heart sounds. No murmur heard.    No friction rub. No gallop.  Pulmonary:     Effort: Pulmonary effort is normal. No respiratory distress.     Breath sounds: Normal breath sounds. No wheezing, rhonchi or rales.  Chest:     Chest wall: No tenderness.  Abdominal:     General: Bowel sounds are normal. There is no distension.     Palpations: Abdomen is soft. There is no mass.     Tenderness: There is no abdominal tenderness. There is no right CVA tenderness, left CVA tenderness, guarding or rebound.     Comments: Tenderness have improved  Musculoskeletal:        General: No swelling or tenderness. Normal range of motion.     Cervical back: Normal range of motion. No rigidity or tenderness.     Right lower leg: No edema.     Left lower leg: No edema.  Lymphadenopathy:     Cervical: No cervical adenopathy.  Skin:    General: Skin is warm and dry.     Coloration: Skin is not pale.     Findings: No bruising, erythema or rash.  Neurological:     Mental Status: She is alert and oriented to person, place, and time.     Motor: No weakness.     Gait: Gait normal.  Psychiatric:        Mood and Affect: Mood normal.        Speech: Speech normal.        Behavior: Behavior normal.     Labs reviewed: Recent Labs    12/22/21 1350 12/29/21 1630 02/20/22 1449  NA 136 135 135  K 4.7 4.2 4.5  CL 99 97* 97*  CO2 '26 29 31  '$ GLUCOSE 88 101* 86  BUN '14 13 16  '$ CREATININE 0.56* 0.72 0.60  CALCIUM 10.2 10.4 10.7*   Recent Labs    03/27/21 0915 05/20/21 1026  12/09/21 1046 12/22/21 1350 02/20/22 1449  AST 28   < > '21 17 25  '$ ALT 18   < > '17 13 21  '$ ALKPHOS 58  --   --   --   --   BILITOT 0.5   < > 0.8 0.5 0.8   PROT 6.7   < > 6.5 6.9 7.0  ALBUMIN 5.0*  --   --   --   --    < > = values in this interval not displayed.   Recent Labs    12/22/21 1350 12/29/21 1630 02/20/22 1449  WBC 5.4 6.9 4.6  NEUTROABS 3,661 4,782 2,916  HGB 12.6 12.4 13.4  HCT 37.8 36.4 40.6  MCV 89.6 88.1 89.0  PLT 252 300 245   Lab Results  Component Value Date   TSH 1.70 11/04/2021   Lab Results  Component Value Date   HGBA1C 5.2 12/09/2021   Lab Results  Component Value Date   CHOL 294 (H) 11/04/2021   HDL 118 11/04/2021   LDLCALC 160 (H) 11/04/2021   TRIG 65 11/04/2021   CHOLHDL 2.5 11/04/2021    Significant Diagnostic Results in last 30 days:  No results found.  Assessment/Plan  1. Right facial swelling Reports swelling and tenderness along right jaw few days ago but symptoms have improved. Will obtain labs to rule out other acute abnormalities. - advised to alternate  ice compression and heat  - continue to monitor  - Notify provider if symptoms worsen  - CBC with Differential/Platelet  2. Acute diverticulitis Continue on Cipro  - continue on Pro-biotics   Family/ staff Communication: Reviewed plan of care with patient verbalized understanding   Labs/tests ordered: - CBC with Differential/Platelet  Next Appointment: Return if symptoms worsen or fail to improve.   Sandrea Hughs, NP

## 2022-04-03 ENCOUNTER — Telehealth: Payer: Self-pay | Admitting: Interventional Cardiology

## 2022-04-03 NOTE — Telephone Encounter (Signed)
Pt is returning call. Transferred to Thompson Grayer, RN.

## 2022-04-03 NOTE — Telephone Encounter (Signed)
-----   Message from [name], MD sent at 03/31/2022 10:35 PM EDT ----- Calcium score 0.  Aortic atherosclerosis confirmed.  COntinue with plan to take statin.

## 2022-04-03 NOTE — Telephone Encounter (Signed)
I spoke with patient and reviewed results/recommendations with her

## 2022-05-11 ENCOUNTER — Other Ambulatory Visit: Payer: Self-pay

## 2022-05-11 DIAGNOSIS — E785 Hyperlipidemia, unspecified: Secondary | ICD-10-CM

## 2022-05-12 ENCOUNTER — Other Ambulatory Visit: Payer: Medicare Other

## 2022-05-12 ENCOUNTER — Ambulatory Visit: Payer: Medicare Other | Admitting: Family

## 2022-05-12 ENCOUNTER — Encounter: Payer: Self-pay | Admitting: Family

## 2022-05-12 VITALS — BP 120/78 | HR 77 | Temp 97.9°F | Resp 16 | Ht 64.0 in | Wt 128.6 lb

## 2022-05-12 DIAGNOSIS — K5792 Diverticulitis of intestine, part unspecified, without perforation or abscess without bleeding: Secondary | ICD-10-CM | POA: Diagnosis not present

## 2022-05-12 LAB — CBC WITH DIFFERENTIAL/PLATELET
Absolute Monocytes: 710 cells/uL (ref 200–950)
Basophils Absolute: 39 cells/uL (ref 0–200)
Basophils Relative: 0.7 %
Eosinophils Absolute: 83 cells/uL (ref 15–500)
Eosinophils Relative: 1.5 %
HCT: 36.9 % (ref 35.0–45.0)
Hemoglobin: 12.6 g/dL (ref 11.7–15.5)
Lymphs Abs: 880 cells/uL (ref 850–3900)
MCH: 30.1 pg (ref 27.0–33.0)
MCHC: 34.1 g/dL (ref 32.0–36.0)
MCV: 88.1 fL (ref 80.0–100.0)
MPV: 10.3 fL (ref 7.5–12.5)
Monocytes Relative: 12.9 %
Neutro Abs: 3790 cells/uL (ref 1500–7800)
Neutrophils Relative %: 68.9 %
Platelets: 213 10*3/uL (ref 140–400)
RBC: 4.19 10*6/uL (ref 3.80–5.10)
RDW: 13.2 % (ref 11.0–15.0)
Total Lymphocyte: 16 %
WBC: 5.5 10*3/uL (ref 3.8–10.8)

## 2022-05-12 MED ORDER — CIPROFLOXACIN HCL 500 MG PO TABS: 500.0000 mg | ORAL_TABLET | Freq: Two times a day (BID) | ORAL | 0 refills | Status: AC

## 2022-05-12 MED ORDER — FLUCONAZOLE 100 MG PO TABS: ORAL_TABLET | ORAL | 3 refills | Status: DC

## 2022-05-12 NOTE — Progress Notes (Signed)
Provider: Kaileia Flow FNP-C  Lauree Chandler, NP  Patient Care Team: Lauree Chandler, NP as PCP - General (Geriatric Medicine) [name], MD as PCP - Cardiology (Cardiology) Marcial Pacas, MD as Consulting Physician (Neurology)  Extended Emergency Contact Information Primary Emergency Contact: [name] [place] Phone: [phone] Relation: Significant other Secondary Emergency Contact: [place] Phone: [phone] Relation: Son  Code Status:Full Code  Goals of care: Advanced Directive information    05/12/2022    1:20 PM  Advanced Directives  Does Patient Have a Medical Advance Directive? Yes  Type of Paramedic of [place];Living will;Out of facility DNR (pink MOST or yellow form)  Does patient want to make changes to medical advance directive? No - Patient declined  Copy of [place] in Chart? No - copy requested     Chief Complaint  Patient presents with   Acute Visit    Patient complains of possible diverticulitis flare up.     HPI:  Pt is a 71 y.o. female seen today for an acute visit for evaluation of left lower abdominal pain and diarrhea.Had diarrhea few days ago but none today.states possible another flare up of diverticulitis.  Appetite is normal She denies any fever,chills,bloating,nausea or vomiting.   Past Medical History:  Diagnosis Date   Anxiety and depression    Breast cancer ([place]) 2001   RIGHT SIDE. S/P CHEMOTHERAPY   COVID 11/29/2020   Headache    Mitral valve regurgitation    Osteoporosis    Personal history of chemotherapy    Personal history of radiation therapy    Past Surgical History:  Procedure Laterality Date   APPENDECTOMY     AT AGE 67 YEARS OLD   BREAST LUMPECTOMY Right 2001   AT AGE 6 YEARS OLD   CHOLECYSTECTOMY     GALLBLADDER SURGERY  2012    Allergies  Allergen Reactions   Codeine     RINGING IN THE EARS.     Other Other (See Comments)    PAIN KILLERS   Oxycodone Itching   Sulfa Antibiotics Other (See Comments)   Ibuprofen Rash   Penicillins Swelling and Rash    Outpatient Encounter Medications as of 05/12/2022  Medication Sig   aspirin EC 81 MG tablet Take 1 tablet (81 mg total) by mouth daily. Swallow whole.   atorvastatin (LIPITOR) 10 MG tablet Take 1 tablet (10 mg total) by mouth daily.   b complex vitamins capsule Take 1 capsule by mouth daily.   Biotin 10 MG CAPS Take by mouth.   calcium citrate (CALCITRATE - DOSED IN MG ELEMENTAL CALCIUM) 950 (200 Ca) MG tablet Take 200 mg of elemental calcium by mouth daily.   cholecalciferol (VITAMIN D INFANT) 10 MCG/ML LIQD Take 400 Units by mouth daily.   citalopram (CELEXA) 20 MG tablet Take 2 tablets (40 mg total) by mouth daily.   Collagenase POWD by Does not apply route. Vital Protein Collagen Peptides 2 scoops into coffee daily   cyanocobalamin (VITAMIN B12) 1000 MCG tablet Take 1,000 mcg by mouth daily.   folic acid (FOLVITE) 053 MCG tablet Take 400 mcg by mouth daily.   ibuprofen (ADVIL) 800 MG tablet Take by mouth as needed.   methocarbamol (ROBAXIN) 500 MG tablet Take 500 mg by mouth daily as needed for muscle spasms.   Multiple Vitamins-Minerals (ZINC PO) Take 1 capsule by mouth daily.   Omega-3 Fatty Acids (OMEGA-3 PO) Take 1 capsule by mouth  daily.   Oral Electrolytes (LIQUID I.V. PO) Take by mouth as needed (dehydration).   Probiotic Product (PROBIOTIC-10 PO) Take 1 capsule by mouth 2 (two) times daily.   saccharomyces boulardii (FLORASTOR) 250 MG capsule Take 1 capsule (250 mg total) by mouth 2 (two) times daily.   traZODone (DESYREL) 50 MG tablet Take 1 tablet (50 mg total) by mouth at bedtime.   Wheat Dextrin (BENEFIBER DRINK MIX PO) Take 1 Package by mouth daily.   No facility-administered encounter medications on file as of 05/12/2022.    Review of Systems  Constitutional:  Negative for appetite change, chills, fatigue, fever  and unexpected weight change.  Respiratory:  Negative for cough, chest tightness, shortness of breath and wheezing.   Cardiovascular:  Negative for chest pain, palpitations and leg swelling.  Gastrointestinal:  Positive for abdominal pain and diarrhea. Negative for abdominal distention, blood in stool, constipation, nausea and vomiting.  Neurological:  Negative for dizziness, weakness, light-headedness and headaches.    Immunization History  Administered Date(s) Administered   Fluad Quad(high Dose 65+) 02/20/2022   Influenza Split 03/09/2019   Influenza, High Dose Seasonal PF 03/10/2018, 04/04/2020   PFIZER(Purple Top)SARS-COV-2 Vaccination 07/31/2019, 08/29/2019, 05/13/2020   Pneumococcal Conjugate-13 12/19/2015   Pneumococcal Polysaccharide-23 03/29/2001, 03/09/2019   Td 03/09/2019   Tdap 03/14/2008, 03/09/2019   Zoster Recombinat (Shingrix) 04/04/2020, 11/08/2020   Zoster, Live 07/29/2012   Pertinent  Health Maintenance Due  Topic Date Due   COLONOSCOPY (Pts 45-35yr Insurance coverage will need to be confirmed)  Never done   MAMMOGRAM  01/04/2023   INFLUENZA VACCINE  Completed   DEXA SCAN  Completed      05/28/2021    3:33 PM 12/22/2021    1:21 PM 01/08/2022   12:35 PM 02/20/2022    2:13 PM 05/12/2022    1:20 PM  [place]in the past year? 0 0 0 0 0  Was there an injury with Fall? 0 0 0 0 0  Fall Risk Category Calculator 0 0 0 0 0  Fall Risk Category Low Low Low Low Low  Patient Fall Risk Level Low fall risk Low fall risk Low fall risk Low fall risk Low fall risk  Patient at Risk for Falls Due to No Fall Risks No Fall Risks No Fall Risks No Fall Risks No Fall Risks  Fall risk Follow up Falls evaluation completed Falls evaluation completed Falls evaluation completed Falls evaluation completed Falls evaluation completed   Functional Status Survey:    Vitals:   05/12/22 1319  BP: 120/78  Pulse: 77  Resp: 16  Temp: 97.9 F (36.6 C)  SpO2: 98%  Weight: 128 lb  9.6 oz (58.3 kg)  Height: '5\' 4"'$  (1.626 m)   Body mass index is 22.07 kg/m. Physical Exam Vitals reviewed.  Constitutional:      General: She is not in acute distress.    Appearance: Normal appearance. She is normal weight. She is not ill-appearing or diaphoretic.  Eyes:     General: No scleral icterus.       Right eye: No discharge.        Left eye: No discharge.     Conjunctiva/sclera: Conjunctivae normal.     Pupils: Pupils are equal, round, and reactive to light.  Cardiovascular:     Rate and Rhythm: Normal rate and regular rhythm.     Pulses: Normal pulses.     Heart sounds: Normal heart sounds. No murmur heard.    No friction rub.  No gallop.  Pulmonary:     Effort: Pulmonary effort is normal. No respiratory distress.     Breath sounds: Normal breath sounds. No wheezing, rhonchi or rales.  Chest:     Chest wall: No tenderness.  Abdominal:     General: Bowel sounds are normal. There is no distension.     Palpations: Abdomen is soft. There is no mass.     Tenderness: There is abdominal tenderness in the left upper quadrant and left lower quadrant. There is no right CVA tenderness, left CVA tenderness, guarding or rebound.  Neurological:     Mental Status: She is alert and oriented to person, place, and time.     Motor: No weakness.     Gait: Gait normal.  Psychiatric:        Mood and Affect: Mood normal.        Speech: Speech normal.        Behavior: Behavior normal.    Labs reviewed: Recent Labs    12/22/21 1350 12/29/21 1630 02/20/22 1449  NA 136 135 135  K 4.7 4.2 4.5  CL 99 97* 97*  CO2 '26 29 31  '$ GLUCOSE 88 101* 86  BUN '14 13 16  '$ CREATININE 0.56* 0.72 0.60  CALCIUM 10.2 10.4 10.7*   Recent Labs    12/09/21 1046 12/22/21 1350 02/20/22 1449  AST '21 17 25  '$ ALT '17 13 21  '$ BILITOT 0.8 0.5 0.8  PROT 6.5 6.9 7.0   Recent Labs    12/29/21 1630 02/20/22 1449 03/26/22 1502  WBC 6.9 4.6 4.0  NEUTROABS 4,782 2,916 2,372  HGB 12.4 13.4 13.0  HCT 36.4  40.6 38.4  MCV 88.1 89.0 88.5  PLT 300 245 284   Lab Results  Component Value Date   TSH 1.70 11/04/2021   Lab Results  Component Value Date   HGBA1C 5.2 12/09/2021   Lab Results  Component Value Date   CHOL 294 (H) 11/04/2021   HDL 118 11/04/2021   LDLCALC 160 (H) 11/04/2021   TRIG 65 11/04/2021   CHOLHDL 2.5 11/04/2021    Significant Diagnostic Results in last 30 days:  No results found.  Assessment/Plan  Acute diverticulitis Afebrile  - LLQ and LUQ tender to palpation  Start on Cipro which has been effective in the past .encourage to eat yogurt or OTC Probiotics - ciprofloxacin (CIPRO) 500 MG tablet; Take 1 tablet (500 mg total) by mouth 2 (two) times daily for 7 days.  Dispense: 14 tablet; Refill: 0 - CBC with Differential/Platelet  Family/ staff Communication: Reviewed plan of care with patient verbalized understanding   Labs/tests ordered: - CBC with Differential/Platelet  Next Appointment: Return if symptoms worsen or fail to improve.   Sandrea Hughs, NP

## 2022-05-13 ENCOUNTER — Other Ambulatory Visit: Payer: Medicare Other

## 2022-05-14 ENCOUNTER — Other Ambulatory Visit: Payer: Medicare Other

## 2022-05-18 ENCOUNTER — Ambulatory Visit: Payer: Medicare Other | Admitting: Nurse Practitioner

## 2022-05-22 DIAGNOSIS — Z09 Encounter for follow-up examination after completed treatment for conditions other than malignant neoplasm: Secondary | ICD-10-CM | POA: Diagnosis not present

## 2022-05-29 ENCOUNTER — Ambulatory Visit: Payer: Medicare Other | Admitting: Nurse Practitioner

## 2022-06-08 ENCOUNTER — Encounter: Payer: Medicare Other | Admitting: Nurse Practitioner

## 2022-06-10 ENCOUNTER — Encounter: Payer: Medicare Other | Admitting: Family

## 2022-06-15 ENCOUNTER — Ambulatory Visit (INDEPENDENT_AMBULATORY_CARE_PROVIDER_SITE_OTHER): Payer: Medicare Other | Admitting: Nurse Practitioner

## 2022-06-15 ENCOUNTER — Encounter: Payer: Self-pay | Admitting: Nurse Practitioner

## 2022-06-15 VITALS — BP 126/74 | HR 63 | Temp 97.8°F | Resp 17 | Ht 64.0 in | Wt 131.6 lb

## 2022-06-15 DIAGNOSIS — M810 Age-related osteoporosis without current pathological fracture: Secondary | ICD-10-CM | POA: Diagnosis not present

## 2022-06-15 DIAGNOSIS — Z1159 Encounter for screening for other viral diseases: Secondary | ICD-10-CM

## 2022-06-15 DIAGNOSIS — E2839 Other primary ovarian failure: Secondary | ICD-10-CM | POA: Diagnosis not present

## 2022-06-15 DIAGNOSIS — Z0000 Encounter for general adult medical examination without abnormal findings: Secondary | ICD-10-CM

## 2022-06-15 NOTE — Patient Instructions (Signed)
Mariah Romero , Thank you for taking time to come for your Medicare Wellness Visit. I appreciate your ongoing commitment to your health goals. Please review the following plan we discussed and let me know if I can assist you in the future.   Screening recommendations/referrals: Colonoscopy up to date- please sign record release on check on Mammogram -due- to call and make appt Bone Density -due- to call and make appt Recommended yearly ophthalmology/optometry visit for glaucoma screening and checkup Recommended yearly dental visit for hygiene and checkup  Vaccinations: Influenza vaccine- due annually in September/October Pneumococcal vaccine up to date Tdap vaccine up to date Shingles vaccine up to date    Advanced directives: on file  Conditions/risks identified: advanced age  Next appointment: yearly    Preventive Care 59 Years and Older, Female Preventive care refers to lifestyle choices and visits with your health care provider that can promote health and wellness. What does preventive care include? A yearly physical exam. This is also called an annual well check. Dental exams once or twice a year. Routine eye exams. Ask your health care provider how often you should have your eyes checked. Personal lifestyle choices, including: Daily care of your teeth and gums. Regular physical activity. Eating a healthy diet. Avoiding tobacco and drug use. Limiting alcohol use. Practicing safe sex. Taking low-dose aspirin every day. Taking vitamin and mineral supplements as recommended by your health care provider. What happens during an annual well check? The services and screenings done by your health care provider during your annual well check will depend on your age, overall health, lifestyle risk factors, and family history of disease. Counseling  Your health care provider may ask you questions about your: Alcohol use. Tobacco use. Drug use. Emotional well-being. Home and  relationship well-being. Sexual activity. Eating habits. History of falls. Memory and ability to understand (cognition). Work and work Statistician. Reproductive health. Screening  You may have the following tests or measurements: Height, weight, and BMI. Blood pressure. Lipid and cholesterol levels. These may be checked every 5 years, or more frequently if you are over 41 years old. Skin check. Lung cancer screening. You may have this screening every year starting at age 83 if you have a 30-pack-year history of smoking and currently smoke or have quit within the past 15 years. Fecal occult blood test (FOBT) of the stool. You may have this test every year starting at age 4. Flexible sigmoidoscopy or colonoscopy. You may have a sigmoidoscopy every 5 years or a colonoscopy every 10 years starting at age 67. Hepatitis C blood test. Hepatitis B blood test. Sexually transmitted disease (STD) testing. Diabetes screening. This is done by checking your blood sugar (glucose) after you have not eaten for a while (fasting). You may have this done every 1-3 years. Bone density scan. This is done to screen for osteoporosis. You may have this done starting at age 61. Mammogram. This may be done every 1-2 years. Talk to your health care provider about how often you should have regular mammograms. Talk with your health care provider about your test results, treatment options, and if necessary, the need for more tests. Vaccines  Your health care provider may recommend certain vaccines, such as: Influenza vaccine. This is recommended every year. Tetanus, diphtheria, and acellular pertussis (Tdap, Td) vaccine. You may need a Td booster every 10 years. Zoster vaccine. You may need this after age 63. Pneumococcal 13-valent conjugate (PCV13) vaccine. One dose is recommended after age 76. Pneumococcal polysaccharide (PPSV23)  vaccine. One dose is recommended after age 66. Talk to your health care provider  about which screenings and vaccines you need and how often you need them. This information is not intended to replace advice given to you by your health care provider. Make sure you discuss any questions you have with your health care provider. Document Released: 06/14/2015 Document Revised: 02/05/2016 Document Reviewed: 03/19/2015 Elsevier Interactive Patient Education  2017 [place] Prevention in the Home Falls can cause injuries. They can happen to people of all ages. There are many things you can do to make your home safe and to help prevent falls. What can I do on the outside of my home? Regularly fix the edges of walkways and driveways and fix any cracks. Remove anything that might make you trip as you walk through a door, such as a raised step or threshold. Trim any bushes or trees on the path to your home. Use bright outdoor lighting. Clear any walking paths of anything that might make someone trip, such as rocks or tools. Regularly check to see if handrails are loose or broken. Make sure that both sides of any steps have handrails. Any raised decks and porches should have guardrails on the edges. Have any leaves, snow, or ice cleared regularly. Use sand or salt on walking paths during winter. Clean up any spills in your garage right away. This includes oil or grease spills. What can I do in the bathroom? Use night lights. Install grab bars by the toilet and in the tub and shower. Do not use towel bars as grab bars. Use non-skid mats or decals in the tub or shower. If you need to sit down in the shower, use a plastic, non-slip stool. Keep the floor dry. Clean up any water that spills on the floor as soon as it happens. Remove soap buildup in the tub or shower regularly. Attach bath mats securely with double-sided non-slip rug tape. Do not have throw rugs and other things on the floor that can make you trip. What can I do in the bedroom? Use night lights. Make sure  that you have a light by your bed that is easy to reach. Do not use any sheets or blankets that are too big for your bed. They should not hang down onto the floor. Have a firm chair that has side arms. You can use this for support while you get dressed. Do not have throw rugs and other things on the floor that can make you trip. What can I do in the kitchen? Clean up any spills right away. Avoid walking on wet floors. Keep items that you use a lot in easy-to-reach places. If you need to reach something above you, use a strong step stool that has a grab bar. Keep electrical cords out of the way. Do not use floor polish or wax that makes floors slippery. If you must use wax, use non-skid floor wax. Do not have throw rugs and other things on the floor that can make you trip. What can I do with my stairs? Do not leave any items on the stairs. Make sure that there are handrails on both sides of the stairs and use them. Fix handrails that are broken or loose. Make sure that handrails are as long as the stairways. Check any carpeting to make sure that it is firmly attached to the stairs. Fix any carpet that is loose or worn. Avoid having throw rugs at the top or bottom  of the stairs. If you do have throw rugs, attach them to the floor with carpet tape. Make sure that you have a light switch at the top of the stairs and the bottom of the stairs. If you do not have them, ask someone to add them for you. What else can I do to help prevent falls? Wear shoes that: Do not have high heels. Have rubber bottoms. Are comfortable and fit you well. Are closed at the toe. Do not wear sandals. If you use a stepladder: Make sure that it is fully opened. Do not climb a closed stepladder. Make sure that both sides of the stepladder are locked into place. Ask someone to hold it for you, if possible. Clearly mark and make sure that you can see: Any grab bars or handrails. First and last steps. Where the edge of  each step is. Use tools that help you move around (mobility aids) if they are needed. These include: Canes. Walkers. Scooters. Crutches. Turn on the lights when you go into a dark area. Replace any light bulbs as soon as they burn out. Set up your furniture so you have a clear path. Avoid moving your furniture around. If any of your floors are uneven, fix them. If there are any pets around you, be aware of where they are. Review your medicines with your doctor. Some medicines can make you feel dizzy. This can increase your chance of falling. Ask your doctor what other things that you can do to help prevent falls. This information is not intended to replace advice given to you by your health care provider. Make sure you discuss any questions you have with your health care provider. Document Released: 03/14/2009 Document Revised: 10/24/2015 Document Reviewed: 06/22/2014 Elsevier Interactive Patient Education  2017 [employer].

## 2022-06-15 NOTE — Progress Notes (Signed)
Subjective:   Mariah Romero is a 72 y.o. female who presents for Medicare Annual (Subsequent) preventive examination.  Review of Systems           Objective:    Today's Vitals   06/15/22 1405  BP: 126/74  Pulse: 63  Resp: 17  Temp: 97.8 F (36.6 C)  TempSrc: Temporal  SpO2: 98%  Weight: 131 lb 9.6 oz (59.7 kg)  Height: '5\' 4"'$  (1.626 m)   Body mass index is 22.59 kg/m.     06/15/2022    2:05 PM 05/12/2022    1:20 PM 02/20/2022    2:13 PM 01/08/2022   12:35 PM 12/22/2021    1:22 PM 11/07/2021    8:57 AM 05/28/2021    3:33 PM  Advanced Directives  Does Patient Have a Medical Advance Directive? No Yes No No No No No  Type of Social research officer, government;Living will;Out of facility DNR (pink MOST or yellow form)       Does patient want to make changes to medical advance directive?  No - Patient declined       Copy of [place] in Chart?  No - copy requested       Would patient like information on creating a medical advance directive?   No - Patient declined No - Patient declined No - Patient declined No - Patient declined No - Patient declined    Current Medications (verified) Outpatient Encounter Medications as of 06/15/2022  Medication Sig   aspirin EC 81 MG tablet Take 1 tablet (81 mg total) by mouth daily. Swallow whole.   atorvastatin (LIPITOR) 10 MG tablet Take 1 tablet (10 mg total) by mouth daily.   b complex vitamins capsule Take 1 capsule by mouth daily.   Biotin 10 MG CAPS Take by mouth.   cholecalciferol (VITAMIN D INFANT) 10 MCG/ML LIQD Take 400 Units by mouth daily.   citalopram (CELEXA) 20 MG tablet Take 2 tablets (40 mg total) by mouth daily.   Collagenase POWD by Does not apply route. Vital Protein Collagen Peptides 2 scoops into coffee daily   cyanocobalamin (VITAMIN B12) 1000 MCG tablet Take 1,000 mcg by mouth daily.   folic acid (FOLVITE) 856 MCG tablet Take 400 mcg by mouth daily.   ibuprofen (ADVIL) 800 MG  tablet Take by mouth as needed.   methocarbamol (ROBAXIN) 500 MG tablet Take 500 mg by mouth daily as needed for muscle spasms.   Multiple Vitamins-Minerals (ZINC PO) Take 1 capsule by mouth daily.   Omega-3 Fatty Acids (OMEGA-3 PO) Take 1 capsule by mouth daily.   Oral Electrolytes (LIQUID I.V. PO) Take by mouth as needed (dehydration).   Probiotic Product (PROBIOTIC-10 PO) Take 1 capsule by mouth 2 (two) times daily.   saccharomyces boulardii (FLORASTOR) 250 MG capsule Take 1 capsule (250 mg total) by mouth 2 (two) times daily.   traZODone (DESYREL) 50 MG tablet Take 1 tablet (50 mg total) by mouth at bedtime.   Wheat Dextrin (BENEFIBER DRINK MIX PO) Take 1 Package by mouth daily.   [DISCONTINUED] fluconazole (DIFLUCAN) 100 MG tablet Take one by mouth x 1 dose then repeat dose in one week   calcium citrate (CALCITRATE - DOSED IN MG ELEMENTAL CALCIUM) 950 (200 Ca) MG tablet Take 200 mg of elemental calcium by mouth daily.   No facility-administered encounter medications on file as of 06/15/2022.    Allergies (verified) Codeine, Other, Oxycodone, Sulfa antibiotics, Ibuprofen, and Penicillins  History: Past Medical History:  Diagnosis Date   Anxiety and depression    Breast cancer ([place]) 2001   RIGHT SIDE. S/P CHEMOTHERAPY   COVID 11/29/2020   Headache    Mitral valve regurgitation    Osteoporosis    Personal history of chemotherapy    Personal history of radiation therapy    Past Surgical History:  Procedure Laterality Date   APPENDECTOMY     AT AGE 41 YEARS OLD   BREAST LUMPECTOMY Right 2001   AT AGE 29 YEARS OLD   CHOLECYSTECTOMY     GALLBLADDER SURGERY  2012   Family History  Problem Relation Age of Onset   Dementia Mother 11   Diabetes Mother    Hypertension Mother    Melanoma Father    Mental illness Father    CVA Father    Breast cancer Sister    Emphysema Paternal Grandmother        never smoker   Asthma Sister        had asthma as a child   Social History    Socioeconomic History   Marital status: Widowed    Spouse name: Not on file   Number of children: 2   Years of education: COLLEGE   Highest education level: Not on file  Occupational History   Occupation: RETIRED  Tobacco Use   Smoking status: Never   Smokeless tobacco: Never  Vaping Use   Vaping Use: Never used  Substance and Sexual Activity   Alcohol use: Yes    Comment: 14 drinks a week.   Drug use: No   Sexual activity: Not on file  Other Topics Concern   Not on file  Social History Narrative   Tobacco use, amount per day now: Never   Past tobacco use, amount per day: Never   How many years did you use tobacco:   Alcohol use (drinks per week): 14   Diet: Low fat   Do you drink/eat things with caffeine: yes   Marital status:   Widowed                               What year were you married? 1981   Do you live in a house, apartment, assisted living, condo, trailer, etc.?    Is it one or more stories? 2   How many persons live in your home? 2-3   Do you have pets in your home?( please list) Sometimes a family dog.   Highest Level of education completed? College.   Current or past profession: Pharmacist, hospital, Forensic psychologist.   Do you exercise? Little                                 Type and how often? Walk & Stair Climbing.   Do you have a living will? No   Do you have a DNR form?    No                               If not, do you want to discuss one?   Do you have signed POA/HPOA forms? No                        If so, please bring to you appointment  Do you have any difficulty bathing or dressing yourself? No   Do you have any difficulty preparing food or eating? No   Do you have any difficulty managing your medications? No   Do you have any difficulty managing your finances? No   Do you have any difficulty affording your medications? No   Social Determinants of Health   Financial Resource Strain: Not on file  Food Insecurity: Not on file  Transportation Needs:  Not on file  Physical Activity: Not on file  Stress: Not on file  Social Connections: Not on file    Tobacco Counseling Counseling given: Not Answered   Clinical Intake:                 Diabetic?no         Activities of Daily Living     No data to display          Patient Care Team: Lauree Chandler, NP as PCP - General (Geriatric Medicine) [name], MD as PCP - Cardiology (Cardiology) Marcial Pacas, MD as Consulting Physician (Neurology)  Indicate any recent Medical Services you may have received from other than [facility] providers in the past year (date may be approximate).     Assessment:   This is a routine wellness examination for Charnee.  Hearing/Vision screen No results found.  Dietary issues and exercise activities discussed:     Goals Addressed   None   Depression Screen    06/15/2022    2:04 PM 01/08/2022   12:35 PM  PHQ 2/9 Scores  PHQ - 2 Score 0 0    Fall Risk    06/15/2022    2:04 PM 05/12/2022    1:20 PM 02/20/2022    2:13 PM 01/08/2022   12:35 PM 12/22/2021    1:21 PM  Fall Risk   Falls in the past year? 0 0 0 0 0  Number falls in past yr: 0 0 0 0 0  Injury with Fall? 0 0 0 0 0  Risk for fall due to :  No Fall Risks No Fall Risks No Fall Risks No Fall Risks  Follow up Falls evaluation completed Falls evaluation completed Falls evaluation completed Falls evaluation completed Falls evaluation completed    [place]:  Any stairs in or around the home? Yes  If so, are there any without handrails? No  Home free of loose throw rugs in walkways, pet beds, electrical cords, etc? Yes  Adequate lighting in your home to reduce risk of falls? Yes   ASSISTIVE DEVICES UTILIZED TO PREVENT FALLS:  Life alert? No  Use of a cane, walker or w/c? No  Grab bars in the bathroom? No  Shower chair or bench in shower? Yes  Elevated toilet seat or a handicapped toilet? No   TIMED UP AND GO:  Was the  test performed? No .    Cognitive Function:    06/15/2022    2:09 PM  MMSE - Mini Mental State Exam  Orientation to time 5  Orientation to Place 5  Registration 3  Attention/ Calculation 5  Recall 3  Language- name 2 objects 2  Language- repeat 1  Language- follow 3 step command 3  Language- read & follow direction 1  Write a sentence 1  Copy design 1  Total score 30        Immunizations Immunization History  Administered Date(s) Administered   Fluad Quad(high Dose 65+) 02/20/2022  Influenza Split 03/09/2019   Influenza, High Dose Seasonal PF 03/10/2018, 04/04/2020   PFIZER(Purple Top)SARS-COV-2 Vaccination 07/31/2019, 08/29/2019, 05/13/2020   Pneumococcal Conjugate-13 12/19/2015   Pneumococcal Polysaccharide-23 03/29/2001, 03/09/2019   Td 03/09/2019   Tdap 03/14/2008, 03/09/2019   Zoster Recombinat (Shingrix) 04/04/2020, 11/08/2020   Zoster, Live 07/29/2012    TDAP status: Up to date  Flu Vaccine status: Up to date  Pneumococcal vaccine status: Up to date  Covid-19 vaccine status: Information provided on how to obtain vaccines.   Qualifies for Shingles Vaccine? Yes   Zostavax completed No   Shingrix Completed?: Yes  Screening Tests Health Maintenance  Topic Date Due   COVID-19 Vaccine (4 - 2023-24 season) 07/01/2022 (Originally 01/30/2022)   COLONOSCOPY (Pts 45-24yr Insurance coverage will need to be confirmed)  06/16/2023 (Originally 08/04/1996)   MAMMOGRAM  01/04/2023   Medicare Annual Wellness (AWV)  06/16/2023   DTaP/Tdap/Td (4 - Td or Tdap) 03/08/2029   Pneumonia Vaccine 72 Years old  Completed   INFLUENZA VACCINE  Completed   DEXA SCAN  Completed   Zoster Vaccines- Shingrix  Completed   HPV VACCINES  Aged Out   Hepatitis C Screening  Discontinued    Health Maintenance  There are no preventive care reminders to display for this patient.   Colorectal cancer screening: Type of screening: Colonoscopy. Completed ~2022. Repeat every 5  years  Mammogram status: Completed 01/03/21. Repeat every year  Bone Density status: Ordered today. Pt provided with contact info and advised to call to schedule appt.  Lung Cancer Screening: (Low Dose CT Chest recommended if Age 71-80years, 30 pack-year currently smoking OR have quit w/in 15years.) does not qualify.   Lung Cancer Screening Referral: na  Additional Screening:  Hepatitis C Screening: does qualify; Complete with next labs  Vision Screening: Recommended annual ophthalmology exams for early detection of glaucoma and other disorders of the eye. Is the patient up to date with their annual eye exam?  Yes  Who is the provider or what is the name of the office in which the patient attends annual eye exams? [facility] If pt is not established with a provider, would they like to be referred to a provider to establish care? No .   Dental Screening: Recommended annual dental exams for proper oral hygiene  Community Resource Referral / Chronic Care Management: CRR required this visit?  No   CCM required this visit?  No      Plan:     I have personally reviewed and noted the following in the patient's chart:   Medical and social history Use of alcohol, tobacco or illicit drugs  Current medications and supplements including opioid prescriptions. Patient is not currently taking opioid prescriptions. Functional ability and status Nutritional status Physical activity Advanced directives List of other physicians Hospitalizations, surgeries, and ER visits in previous 12 months Vitals Screenings to include cognitive, depression, and falls Referrals and appointments  In addition, I have reviewed and discussed with patient certain preventive protocols, quality metrics, and best practice recommendations. A written personalized care plan for preventive services as well as general preventive health recommendations were provided to patient.     JLauree Chandler NP   06/15/2022    [facility]

## 2022-06-18 ENCOUNTER — Telehealth: Payer: Self-pay | Admitting: *Deleted

## 2022-06-18 NOTE — Telephone Encounter (Signed)
Would have her start bland soft diet and advance as tolerates

## 2022-06-18 NOTE — Telephone Encounter (Signed)
Patient called and stated that she is being treated for Diverticulitis and was told to do a clear liquid diet.   Patient stated that she is Hungry and wants to know if it would be ok now to start a soft food diet. Stated that she is real hungry.   Please Advise.

## 2022-06-18 NOTE — Telephone Encounter (Signed)
Patient notified and agreed.  

## 2022-06-29 ENCOUNTER — Other Ambulatory Visit: Payer: Self-pay | Admitting: Nurse Practitioner

## 2022-06-29 DIAGNOSIS — Z1231 Encounter for screening mammogram for malignant neoplasm of breast: Secondary | ICD-10-CM

## 2022-06-30 ENCOUNTER — Other Ambulatory Visit (HOSPITAL_BASED_OUTPATIENT_CLINIC_OR_DEPARTMENT_OTHER): Payer: Self-pay | Admitting: Gastroenterology

## 2022-06-30 DIAGNOSIS — Z8719 Personal history of other diseases of the digestive system: Secondary | ICD-10-CM | POA: Diagnosis not present

## 2022-07-02 ENCOUNTER — Other Ambulatory Visit: Payer: Self-pay | Admitting: Family

## 2022-07-02 ENCOUNTER — Other Ambulatory Visit: Payer: Self-pay | Admitting: Neurology

## 2022-07-02 DIAGNOSIS — F339 Major depressive disorder, recurrent, unspecified: Secondary | ICD-10-CM

## 2022-07-03 ENCOUNTER — Ambulatory Visit (HOSPITAL_BASED_OUTPATIENT_CLINIC_OR_DEPARTMENT_OTHER)
Admission: RE | Admit: 2022-07-03 | Discharge: 2022-07-03 | Disposition: A | Discharge status: Home / Self Care | Payer: Medicare Other | Source: Ambulatory Visit | Attending: Gastroenterology | Admitting: Gastroenterology

## 2022-07-03 DIAGNOSIS — R911 Solitary pulmonary nodule: Secondary | ICD-10-CM | POA: Diagnosis not present

## 2022-07-03 DIAGNOSIS — K5732 Diverticulitis of large intestine without perforation or abscess without bleeding: Secondary | ICD-10-CM | POA: Diagnosis not present

## 2022-07-03 DIAGNOSIS — I700 Atherosclerosis of aorta: Secondary | ICD-10-CM | POA: Diagnosis not present

## 2022-07-03 DIAGNOSIS — Z8719 Personal history of other diseases of the digestive system: Secondary | ICD-10-CM | POA: Diagnosis not present

## 2022-07-03 DIAGNOSIS — N2889 Other specified disorders of kidney and ureter: Secondary | ICD-10-CM | POA: Diagnosis not present

## 2022-07-03 MED ORDER — IOHEXOL 300 MG/ML  SOLN
100.0000 mL | Freq: Once | INTRAMUSCULAR | Status: AC | PRN
  Administered 2022-07-03: 80 mL via INTRAVENOUS

## 2022-07-07 ENCOUNTER — Telehealth: Payer: Self-pay | Admitting: *Deleted

## 2022-07-07 NOTE — Telephone Encounter (Signed)
LMOM for patient to return call.

## 2022-07-07 NOTE — Telephone Encounter (Signed)
Patient called and stated that she requested a refill for Methocarbamol '500mg'$  for muscle spasms from Dr. Kellie Simmering office but she refused it, stating patient needed an appointment.   Patient is wanting to know if you will Prescribe for her since she sees you regularly.   Please Advise.

## 2022-07-07 NOTE — Telephone Encounter (Signed)
Generally not a long term medication, what is she using medication for and how frequently does she take?

## 2022-07-08 MED ORDER — METHOCARBAMOL 500 MG PO TABS: 500.0000 mg | ORAL_TABLET | Freq: Every day | ORAL | 0 refills | Status: AC | PRN

## 2022-07-08 NOTE — Telephone Encounter (Signed)
Patient called back and stated that she uses it when she travels and when she is in the car for extended period of times. Stated that she doesn't take it during the day only at night time.   Stated that it usually expires before she goes through the bottle. Doesn't have to use it very often but likes to have it on hand.   Please Advise.

## 2022-07-08 NOTE — Telephone Encounter (Signed)
Rx sent to pharmacy on file.

## 2022-07-09 NOTE — Telephone Encounter (Signed)
Patient notified

## 2022-07-22 DIAGNOSIS — K5732 Diverticulitis of large intestine without perforation or abscess without bleeding: Secondary | ICD-10-CM | POA: Diagnosis not present

## 2022-08-13 ENCOUNTER — Other Ambulatory Visit: Payer: Medicare Other

## 2022-08-14 ENCOUNTER — Encounter: Payer: Self-pay | Admitting: Adult Health

## 2022-08-14 ENCOUNTER — Ambulatory Visit (INDEPENDENT_AMBULATORY_CARE_PROVIDER_SITE_OTHER): Payer: Medicare Other | Admitting: Adult Health

## 2022-08-14 VITALS — BP 122/80 | HR 71 | Temp 96.7°F | Resp 18 | Ht 64.0 in | Wt 128.5 lb

## 2022-08-14 DIAGNOSIS — R519 Headache, unspecified: Secondary | ICD-10-CM | POA: Diagnosis not present

## 2022-08-14 NOTE — Progress Notes (Signed)
[facility] clinic  Provider:  Durenda Age DNP  Code Status:  Full Code  Goals of Care:     06/15/2022    2:05 PM  Advanced Directives  Does Patient Have a Medical Advance Directive? No     Chief Complaint  Patient presents with   Acute Visit    Swollen salivary glands    HPI: Patient is a 72 y.o. female seen today for an acute visit for having right-sided facial pain last night. She suddenly have 8/10 on her right jaw last nigh and was not able to eat dinner last night and her right jaw was tender. She stated that she as been experiencing this sudden pain for years and would have 3-5X/month. She denies having fever but has chills. She wants to know if she has salivary gland stones and requesting for ultrasound.   Past Medical History:  Diagnosis Date   Anxiety and depression    Breast cancer ([place]) 2001   RIGHT SIDE. S/P CHEMOTHERAPY   COVID 11/29/2020   Headache    Mitral valve regurgitation    Osteoporosis    Personal history of chemotherapy    Personal history of radiation therapy     Past Surgical History:  Procedure Laterality Date   APPENDECTOMY     AT AGE 88 YEARS OLD   BREAST LUMPECTOMY Right 2001   AT AGE 12 YEARS OLD   CHOLECYSTECTOMY     GALLBLADDER SURGERY  2012    Allergies  Allergen Reactions   Codeine     RINGING IN THE EARS.    Other Other (See Comments)    PAIN KILLERS   Oxycodone Itching   Sulfa Antibiotics Other (See Comments)   Ibuprofen Rash   Penicillins Swelling and Rash    Outpatient Encounter Medications as of 08/14/2022  Medication Sig   aspirin EC 81 MG tablet Take 1 tablet (81 mg total) by mouth daily. Swallow whole.   atorvastatin (LIPITOR) 10 MG tablet Take 1 tablet (10 mg total) by mouth daily.   b complex vitamins capsule Take 1 capsule by mouth daily.   Biotin 10 MG CAPS Take by mouth.   calcium citrate (CALCITRATE - DOSED IN MG ELEMENTAL CALCIUM) 950 (200 Ca) MG tablet Take 200 mg of elemental calcium by mouth  daily.   cholecalciferol (VITAMIN D INFANT) 10 MCG/ML LIQD Take 400 Units by mouth daily.   citalopram (CELEXA) 20 MG tablet TAKE 1 AND 1/2 TABLET BY MOUTH DAILY   Collagenase POWD by Does not apply route. Vital Protein Collagen Peptides 2 scoops into coffee daily   cyanocobalamin (VITAMIN B12) 1000 MCG tablet Take 1,000 mcg by mouth daily.   folic acid (FOLVITE) A999333 MCG tablet Take 400 mcg by mouth daily.   ibuprofen (ADVIL) 800 MG tablet Take by mouth as needed.   methocarbamol (ROBAXIN) 500 MG tablet Take 1 tablet (500 mg total) by mouth daily as needed for muscle spasms.   Multiple Vitamins-Minerals (ZINC PO) Take 1 capsule by mouth daily.   Omega-3 Fatty Acids (OMEGA-3 PO) Take 1 capsule by mouth daily.   Oral Electrolytes (LIQUID I.V. PO) Take by mouth as needed (dehydration).   Probiotic Product (PROBIOTIC-10 PO) Take 1 capsule by mouth 2 (two) times daily.   saccharomyces boulardii (FLORASTOR) 250 MG capsule Take 1 capsule (250 mg total) by mouth 2 (two) times daily.   traZODone (DESYREL) 50 MG tablet TAKE 1 TABLET BY MOUTH AT BEDTIME   Wheat Dextrin (BENEFIBER DRINK MIX PO) Take  1 Package by mouth daily.   No facility-administered encounter medications on file as of 08/14/2022.    Review of Systems:  Review of Systems  Constitutional:  Positive for chills. Negative for appetite change, fatigue and fever.  HENT:  Negative for congestion, hearing loss, rhinorrhea and sore throat.        Right facial pain  Eyes: Negative.   Respiratory:  Negative for cough, shortness of breath and wheezing.   Cardiovascular:  Negative for chest pain, palpitations and leg swelling.  Gastrointestinal:  Negative for abdominal pain, constipation, diarrhea, nausea and vomiting.  Genitourinary:  Negative for dysuria.  Musculoskeletal:  Negative for arthralgias, back pain and myalgias.  Skin:  Negative for color change, rash and wound.  Neurological:  Negative for dizziness, weakness and headaches.   Psychiatric/Behavioral:  Negative for behavioral problems. The patient is not nervous/anxious.     Health Maintenance  Topic Date Due   COVID-19 Vaccine (4 - 2023-24 season) 01/30/2022   COLONOSCOPY (Pts 45-66yrs Insurance coverage will need to be confirmed)  06/16/2023 (Originally 08/04/1996)   MAMMOGRAM  01/04/2023   Medicare Annual Wellness (AWV)  06/16/2023   DTaP/Tdap/Td (4 - Td or Tdap) 03/08/2029   Pneumonia Vaccine 70+ Years old  Completed   INFLUENZA VACCINE  Completed   DEXA SCAN  Completed   Zoster Vaccines- Shingrix  Completed   HPV VACCINES  Aged Out   Hepatitis C Screening  Discontinued    Physical Exam: Vitals:   08/14/22 1446  BP: 122/80  Pulse: 71  Resp: 18  Temp: (!) 96.7 F (35.9 C)  SpO2: 97%  Weight: 128 lb 8 oz (58.3 kg)  Height: 5\' 4"  (1.626 m)   Body mass index is 22.06 kg/m. Physical Exam Constitutional:      Appearance: Normal appearance.  HENT:     Head: Normocephalic and atraumatic.     Nose: Nose normal.     Mouth/Throat:     Mouth: Mucous membranes are moist.  Eyes:     Conjunctiva/sclera: Conjunctivae normal.  Cardiovascular:     Rate and Rhythm: Normal rate and regular rhythm.  Pulmonary:     Effort: Pulmonary effort is normal.     Breath sounds: Normal breath sounds.  Abdominal:     General: Bowel sounds are normal.     Palpations: Abdomen is soft.  Musculoskeletal:        General: Normal range of motion.     Cervical back: Normal range of motion.  Skin:    General: Skin is warm and dry.  Neurological:     General: No focal deficit present.     Mental Status: She is alert and oriented to person, place, and time.  Psychiatric:        Mood and Affect: Mood normal.        Behavior: Behavior normal.        Thought Content: Thought content normal.        Judgment: Judgment normal.     Labs reviewed: Basic Metabolic Panel: Recent Labs    11/04/21 1112 12/09/21 1046 12/22/21 1350 12/29/21 1630 02/20/22 1449  NA 136    < > 136 135 135  K 4.2   < > 4.7 4.2 4.5  CL 99   < > 99 97* 97*  CO2 30   < > 26 29 31   GLUCOSE 105*   < > 88 101* 86  BUN 11   < > 14 13 16   CREATININE 0.61   < >  0.56* 0.72 0.60  CALCIUM 10.7*   < > 10.2 10.4 10.7*  TSH 1.70  --   --   --   --    < > = values in this interval not displayed.   Liver Function Tests: Recent Labs    12/09/21 1046 12/22/21 1350 02/20/22 1449  AST 21 17 25   ALT 17 13 21   BILITOT 0.8 0.5 0.8  PROT 6.5 6.9 7.0   No results for input(s): "LIPASE", "AMYLASE" in the last 8760 hours. No results for input(s): "AMMONIA" in the last 8760 hours. CBC: Recent Labs    02/20/22 1449 03/26/22 1502 05/12/22 1358  WBC 4.6 4.0 5.5  NEUTROABS 2,916 2,372 3,790  HGB 13.4 13.0 12.6  HCT 40.6 38.4 36.9  MCV 89.0 88.5 88.1  PLT 245 284 213   Lipid Panel: Recent Labs    11/04/21 1112  CHOL 294*  HDL 118  LDLCALC 160*  TRIG 65  CHOLHDL 2.5   Lab Results  Component Value Date   HGBA1C 5.2 12/09/2021    Procedures since last visit: No results found.  Assessment/Plan  1. Right facial pain -  wants to know if she has salivary gland stones - US Soft Tissue Head/Neck (NON-THYROID); Future - Ambulatory referral to ENT   Labs/tests ordered:  US Soft Tissue Head/Neck (NON-THYROID); Future  Next appt:  12/23/2022

## 2022-08-19 ENCOUNTER — Ambulatory Visit
Admission: RE | Admit: 2022-08-19 | Discharge: 2022-08-19 | Disposition: A | Discharge status: Home / Self Care | Payer: Medicare Other | Source: Ambulatory Visit | Attending: Adult Health | Admitting: Adult Health

## 2022-08-19 DIAGNOSIS — M542 Cervicalgia: Secondary | ICD-10-CM | POA: Diagnosis not present

## 2022-08-19 DIAGNOSIS — R519 Headache, unspecified: Secondary | ICD-10-CM

## 2022-08-20 ENCOUNTER — Ambulatory Visit
Admission: RE | Admit: 2022-08-20 | Discharge: 2022-08-20 | Disposition: A | Discharge status: Home / Self Care | Payer: Medicare Other | Source: Ambulatory Visit | Attending: Nurse Practitioner | Admitting: Nurse Practitioner

## 2022-08-20 ENCOUNTER — Other Ambulatory Visit: Payer: Medicare Other

## 2022-08-20 DIAGNOSIS — Z1231 Encounter for screening mammogram for malignant neoplasm of breast: Secondary | ICD-10-CM

## 2022-08-20 NOTE — Progress Notes (Signed)
-    Ultrasound of head and neck showed no intraductal stones or a parotid nodule/mass.

## 2022-08-21 ENCOUNTER — Ambulatory Visit (INDEPENDENT_AMBULATORY_CARE_PROVIDER_SITE_OTHER): Payer: Medicare Other | Admitting: Nurse Practitioner

## 2022-08-21 ENCOUNTER — Encounter: Payer: Self-pay | Admitting: Nurse Practitioner

## 2022-08-21 VITALS — BP 108/66 | HR 67 | Temp 97.9°F | Ht 64.0 in | Wt 128.0 lb

## 2022-08-21 DIAGNOSIS — F330 Major depressive disorder, recurrent, mild: Secondary | ICD-10-CM

## 2022-08-21 DIAGNOSIS — R519 Headache, unspecified: Secondary | ICD-10-CM | POA: Diagnosis not present

## 2022-08-21 LAB — CBC WITH DIFFERENTIAL/PLATELET
Absolute Monocytes: 608 cells/uL (ref 200–950)
Basophils Absolute: 41 cells/uL (ref 0–200)
Basophils Relative: 0.9 %
Eosinophils Absolute: 108 cells/uL (ref 15–500)
Eosinophils Relative: 2.4 %
HCT: 38 % (ref 35.0–45.0)
Hemoglobin: 12.7 g/dL (ref 11.7–15.5)
Lymphs Abs: 1035 cells/uL (ref 850–3900)
MCH: 29.1 pg (ref 27.0–33.0)
MCHC: 33.4 g/dL (ref 32.0–36.0)
MCV: 87.2 fL (ref 80.0–100.0)
MPV: 10.2 fL (ref 7.5–12.5)
Monocytes Relative: 13.5 %
Neutro Abs: 2709 cells/uL (ref 1500–7800)
Neutrophils Relative %: 60.2 %
Platelets: 227 10*3/uL (ref 140–400)
RBC: 4.36 10*6/uL (ref 3.80–5.10)
RDW: 13 % (ref 11.0–15.0)
Total Lymphocyte: 23 %
WBC: 4.5 10*3/uL (ref 3.8–10.8)

## 2022-08-21 MED ORDER — SACCHAROMYCES BOULARDII 250 MG PO CAPS: 250.0000 mg | ORAL_CAPSULE | Freq: Two times a day (BID) | ORAL | 3 refills | Status: AC

## 2022-08-21 NOTE — Progress Notes (Signed)
Careteam: Patient Care Team: Lauree Chandler, NP as PCP - General (Geriatric Medicine) [name], MD as PCP - Cardiology (Cardiology) Marcial Pacas, MD as Consulting Physician (Neurology)  PLACE OF SERVICE:  [place]  Advanced Directive information    Allergies  Allergen Reactions   Codeine     RINGING IN THE EARS.    Other Other (See Comments)    PAIN KILLERS   Oxycodone Itching   Sulfa Antibiotics Other (See Comments)   Ibuprofen Rash   Penicillins Swelling and Rash    Chief Complaint  Patient presents with   Follow-up    Discuss imaging study      HPI: Patient is a 72 y.o. female  She had pain on right side of her face which started 2 weeks ago. She was seen last week for it.  When she takes a bit of something tangy she gets a shooting pain to the right side.  In the past pain only last a few mins but this time has lasted 2 weeks  Dentist felt like there was an issue with her parotid gland.  She had an ultrasound of head which showed dilation of the right parotid gland duct and recommended CT maxillofacial.  She is still having tenderness to area. No redness  Had pain around ear, now only to front.   She feels more pressure when she has to chew and swallow. When she swallows it makes the pressure sensation improve. No indigestion or acid reflux.  Overall symptoms are improving.   Review of Systems:  Review of Systems  Constitutional:  Negative for chills and fever.  HENT:  Negative for congestion, ear pain and sore throat.   Skin:  Negative for itching and rash.    Past Medical History:  Diagnosis Date   Anxiety and depression    Breast cancer ([place]) 2001   RIGHT SIDE. S/P CHEMOTHERAPY   COVID 11/29/2020   Headache    Mitral valve regurgitation    Osteoporosis    Personal history of chemotherapy    Personal history of radiation therapy    Past Surgical History:  Procedure Laterality Date   APPENDECTOMY     AT AGE 84 YEARS OLD    BREAST LUMPECTOMY Right 2001   AT AGE 66 YEARS OLD   CHOLECYSTECTOMY     GALLBLADDER SURGERY  2012   Social History:   reports that she has never smoked. She has never used smokeless tobacco. She reports current alcohol use. She reports that she does not use drugs.  Family History  Problem Relation Age of Onset   Dementia Mother 86   Diabetes Mother    Hypertension Mother    Melanoma Father    Mental illness Father    CVA Father    Breast cancer Sister    Emphysema Paternal Grandmother        never smoker   Asthma Sister        had asthma as a child    Medications: Patient's Medications  New Prescriptions   No medications on file  Previous Medications   ASPIRIN EC 81 MG TABLET    Take 1 tablet (81 mg total) by mouth daily. Swallow whole.   ATORVASTATIN (LIPITOR) 10 MG TABLET    Take 1 tablet (10 mg total) by mouth daily.   B COMPLEX VITAMINS CAPSULE    Take 1 capsule by mouth daily.   BIOTIN 10 MG CAPS    Take by mouth.  CALCIUM CITRATE (CALCITRATE - DOSED IN MG ELEMENTAL CALCIUM) 950 (200 CA) MG TABLET    Take 200 mg of elemental calcium by mouth daily.   CHOLECALCIFEROL (VITAMIN D INFANT) 10 MCG/ML LIQD    Take 400 Units by mouth daily.   CITALOPRAM (CELEXA) 20 MG TABLET    TAKE 1 AND 1/2 TABLET BY MOUTH DAILY   COLLAGENASE POWD    by Does not apply route. Vital Protein Collagen Peptides 2 scoops into coffee daily   CYANOCOBALAMIN (VITAMIN B12) 1000 MCG TABLET    Take 1,000 mcg by mouth daily.   FOLIC ACID (FOLVITE) A999333 MCG TABLET    Take 400 mcg by mouth daily.   IBUPROFEN (ADVIL) 800 MG TABLET    Take by mouth as needed.   METHOCARBAMOL (ROBAXIN) 500 MG TABLET    Take 1 tablet (500 mg total) by mouth daily as needed for muscle spasms.   MULTIPLE VITAMINS-MINERALS (ZINC PO)    Take 1 capsule by mouth daily.   OMEGA-3 FATTY ACIDS (OMEGA-3 PO)    Take 1 capsule by mouth daily.   ORAL ELECTROLYTES (LIQUID I.V. PO)    Take by mouth as needed (dehydration).   PROBIOTIC  PRODUCT (PROBIOTIC-10 PO)    Take 1 capsule by mouth 2 (two) times daily.   TRAZODONE (DESYREL) 50 MG TABLET    TAKE 1 TABLET BY MOUTH AT BEDTIME   WHEAT DEXTRIN (BENEFIBER DRINK MIX PO)    Take 1 Package by mouth daily.  Modified Medications   Modified Medication Previous Medication   SACCHAROMYCES BOULARDII (FLORASTOR) 250 MG CAPSULE saccharomyces boulardii (FLORASTOR) 250 MG capsule      Take 1 capsule (250 mg total) by mouth 2 (two) times daily.    Take 1 capsule (250 mg total) by mouth 2 (two) times daily.  Discontinued Medications   No medications on file    Physical Exam:  Vitals:   08/21/22 1404  BP: 108/66  Pulse: 67  Temp: 97.9 F (36.6 C)  TempSrc: Temporal  SpO2: 99%  Weight: 128 lb (58.1 kg)  Height: 5\' 4"  (1.626 m)   Body mass index is 21.97 kg/m. Wt Readings from Last 3 Encounters:  08/21/22 128 lb (58.1 kg)  08/14/22 128 lb 8 oz (58.3 kg)  06/15/22 131 lb 9.6 oz (59.7 kg)    Physical Exam Constitutional:      Appearance: Normal appearance.  HENT:     Head: Normocephalic and atraumatic.     Comments: Tenderness noted to face around ear however no redness, swelling or warmth noted.  Inside of mouth without abnormality noted.     Right Ear: Tympanic membrane, ear canal and external ear normal.     Left Ear: Tympanic membrane, ear canal and external ear normal.     Nose: Nose normal. No congestion.     Mouth/Throat:     Mouth: Mucous membranes are moist.     Dentition: Normal dentition. No dental tenderness or dental caries.     Tongue: No lesions. Tongue does not deviate from midline.     Pharynx: Oropharynx is clear.  Pulmonary:     Effort: Pulmonary effort is normal.  Neurological:     Mental Status: She is alert. Mental status is at baseline.  Psychiatric:        Mood and Affect: Mood normal.    Labs reviewed: Basic Metabolic Panel: Recent Labs    11/04/21 1112 12/09/21 1046 12/22/21 1350 12/29/21 1630 02/20/22 1449  NA 136   < >  136 135  135  K 4.2   < > 4.7 4.2 4.5  CL 99   < > 99 97* 97*  CO2 30   < > 26 29 31   GLUCOSE 105*   < > 88 101* 86  BUN 11   < > 14 13 16   CREATININE 0.61   < > 0.56* 0.72 0.60  CALCIUM 10.7*   < > 10.2 10.4 10.7*  TSH 1.70  --   --   --   --    < > = values in this interval not displayed.   Liver Function Tests: Recent Labs    12/09/21 1046 12/22/21 1350 02/20/22 1449  AST 21 17 25   ALT 17 13 21   BILITOT 0.8 0.5 0.8  PROT 6.5 6.9 7.0   No results for input(s): "LIPASE", "AMYLASE" in the last 8760 hours. No results for input(s): "AMMONIA" in the last 8760 hours. CBC: Recent Labs    02/20/22 1449 03/26/22 1502 05/12/22 1358  WBC 4.6 4.0 5.5  NEUTROABS 2,916 2,372 3,790  HGB 13.4 13.0 12.6  HCT 40.6 38.4 36.9  MCV 89.0 88.5 88.1  PLT 245 284 213   Lipid Panel: Recent Labs    11/04/21 1112  CHOL 294*  HDL 118  LDLCALC 160*  TRIG 65  CHOLHDL 2.5   TSH: Recent Labs    11/04/21 1112  TSH 1.70   A1C: Lab Results  Component Value Date   HGBA1C 5.2 12/09/2021   EXAM: ULTRASOUND OF HEAD/NECK SOFT TISSUES   TECHNIQUE: Ultrasound examination of the head and neck soft tissues was performed in the area of clinical concern.   COMPARISON:  Head CT-06/03/2020; brain MRI-04/13/2021   FINDINGS: Sonographic evaluation of patient's palpable area of concern demonstrates apparent dilatation of the right parotid gland duct which measures 0.4 cm in diameter (images 9 through 16). The etiology of this apparent ductal dilatation is not depicted on this examination. Specifically, no discrete echogenic stones or intraparotid nodules are identified. Otherwise, normal appearance of the right parotid gland.   Sonographic evaluation the left parotid gland, scanned for comparison purposes, is without evidence of ductal dilatation. Note is made of a benign-appearing cervical lymph node adjacent to the left parotid gland which is not enlarged by size criteria measuring 0.4 cm in  greatest short axis diameter and maintains a benign fatty hilum.   IMPRESSION: Sonographic evaluation of patient's palpable area of concern demonstrates apparent dilatation of the right parotid gland duct, the etiology of which is not depicted on this examination. Specifically, no definitive evidence of intraductal stones or a parotid nodule/mass. Clinical correlation is advised. Further evaluation with contrast-enhanced maxillofacial CT could be performed as indicated.     Assessment/Plan 1. Right facial pain -she has appt with ENT but not until may- this was the first available. Still with tenderness but overall symptoms have improved. Continue to do warm compresses TID to help with symptoms. To notify for any worsening of symptoms  - CT Maxillofacial W/Cm; Future - CBC with Differential/Platelet  2. Mild episode of recurrent major depressive disorder ([place]) Discussed today, she is doing well on medication and counseling.     Carlos American. [place], [facility] [phone]

## 2022-08-21 NOTE — Patient Instructions (Signed)
Warm compress up to 3 times daily to side of the face  Notify for any changes or worsening of pain.

## 2022-09-01 ENCOUNTER — Ambulatory Visit
Admission: RE | Admit: 2022-09-01 | Discharge: 2022-09-01 | Disposition: A | Discharge status: Home / Self Care | Payer: Medicare Other | Source: Ambulatory Visit | Attending: Nurse Practitioner | Admitting: Nurse Practitioner

## 2022-09-01 DIAGNOSIS — R519 Headache, unspecified: Secondary | ICD-10-CM

## 2022-09-01 MED ORDER — IOPAMIDOL (ISOVUE-300) INJECTION 61%
75.0000 mL | Freq: Once | INTRAVENOUS | Status: AC | PRN
  Administered 2022-09-01: 75 mL via INTRAVENOUS

## 2022-09-14 ENCOUNTER — Telehealth: Payer: Self-pay

## 2022-09-14 DIAGNOSIS — F330 Major depressive disorder, recurrent, mild: Secondary | ICD-10-CM

## 2022-09-14 MED ORDER — CITALOPRAM HYDROBROMIDE 40 MG PO TABS: 40.0000 mg | ORAL_TABLET | Freq: Every day | ORAL | 1 refills | Status: DC

## 2022-09-14 NOTE — Telephone Encounter (Signed)
Rx sent to pharmacy   

## 2022-09-14 NOTE — Telephone Encounter (Signed)
Patient called requesting refill on citalopram. Patient says that prescription should be written for two tablets daily instead on 1 and 1/2. She says this increase was discussed. Please advise.  Message routed to Abbey Chatters, NP

## 2022-09-14 NOTE — Telephone Encounter (Signed)
Just to clarify she wants [place] to send in citalopram 40 mg daily- we can send this over in 1 tablet.

## 2022-09-14 NOTE — Telephone Encounter (Signed)
She wants 40 mg one tablet daily

## 2022-10-06 DIAGNOSIS — R519 Headache, unspecified: Secondary | ICD-10-CM | POA: Diagnosis not present

## 2022-10-06 DIAGNOSIS — K118 Other diseases of salivary glands: Secondary | ICD-10-CM | POA: Diagnosis not present

## 2022-10-06 DIAGNOSIS — J300 Vasomotor rhinitis: Secondary | ICD-10-CM | POA: Diagnosis not present

## 2022-10-08 ENCOUNTER — Telehealth: Payer: Self-pay

## 2022-10-08 DIAGNOSIS — J310 Chronic rhinitis: Secondary | ICD-10-CM

## 2022-10-08 NOTE — Telephone Encounter (Signed)
Patient recently seen ENT, mentioned that she has a constant runny nose x several years, and they recommended that she contact her PCP for a rx for Atrovent.  [facility] is [facility]

## 2022-10-09 MED ORDER — IPRATROPIUM BROMIDE 0.03 % NA SOLN: 2.0000 | Freq: Two times a day (BID) | NASAL | 12 refills | Status: DC

## 2022-10-09 NOTE — Telephone Encounter (Signed)
Rx sent to pharmacy   

## 2022-10-14 DIAGNOSIS — G43B0 Ophthalmoplegic migraine, not intractable: Secondary | ICD-10-CM | POA: Diagnosis not present

## 2022-10-14 DIAGNOSIS — H43811 Vitreous degeneration, right eye: Secondary | ICD-10-CM | POA: Diagnosis not present

## 2022-10-14 DIAGNOSIS — H11043 Peripheral pterygium, stationary, bilateral: Secondary | ICD-10-CM | POA: Diagnosis not present

## 2022-10-14 DIAGNOSIS — H5202 Hypermetropia, left eye: Secondary | ICD-10-CM | POA: Diagnosis not present

## 2022-10-14 DIAGNOSIS — H524 Presbyopia: Secondary | ICD-10-CM | POA: Diagnosis not present

## 2022-10-14 DIAGNOSIS — H52223 Regular astigmatism, bilateral: Secondary | ICD-10-CM | POA: Diagnosis not present

## 2022-10-14 DIAGNOSIS — H2513 Age-related nuclear cataract, bilateral: Secondary | ICD-10-CM | POA: Diagnosis not present

## 2022-11-09 ENCOUNTER — Other Ambulatory Visit: Payer: Self-pay

## 2022-11-09 DIAGNOSIS — J310 Chronic rhinitis: Secondary | ICD-10-CM

## 2022-11-09 MED ORDER — IPRATROPIUM BROMIDE 0.03 % NA SOLN: 2.0000 | Freq: Two times a day (BID) | NASAL | 12 refills | Status: DC

## 2022-11-09 NOTE — Telephone Encounter (Signed)
I spoke with patient's mother lab results this afternoon as I was discussing the labs with her,  she stated that is received a month supply of ipratropium 0.03% nasal spray and is requesting for a 90-day because she will be traveling. Also she wanted to know if she can move up her appointment for fasting labs to December 09, 2022.  Message routed to Sharon Seller, NP

## 2022-11-24 ENCOUNTER — Other Ambulatory Visit: Payer: Self-pay

## 2022-11-24 DIAGNOSIS — E2839 Other primary ovarian failure: Secondary | ICD-10-CM

## 2022-11-24 DIAGNOSIS — M810 Age-related osteoporosis without current pathological fracture: Secondary | ICD-10-CM

## 2022-11-24 DIAGNOSIS — Z1159 Encounter for screening for other viral diseases: Secondary | ICD-10-CM

## 2022-12-10 ENCOUNTER — Other Ambulatory Visit: Payer: Self-pay | Admitting: Family

## 2022-12-10 MED ORDER — TRAZODONE HCL 50 MG PO TABS: 50.0000 mg | ORAL_TABLET | Freq: Every day | ORAL | 1 refills | Status: DC

## 2022-12-10 NOTE — Telephone Encounter (Signed)
High warning came up when trying to refill medication  Medication pended and sent to Jessica Eubanks, NP 

## 2022-12-23 ENCOUNTER — Other Ambulatory Visit: Payer: Medicare Other

## 2022-12-24 ENCOUNTER — Other Ambulatory Visit: Payer: Medicare Other

## 2022-12-24 DIAGNOSIS — E785 Hyperlipidemia, unspecified: Secondary | ICD-10-CM | POA: Diagnosis not present

## 2022-12-24 DIAGNOSIS — Z1159 Encounter for screening for other viral diseases: Secondary | ICD-10-CM | POA: Diagnosis not present

## 2022-12-24 DIAGNOSIS — M810 Age-related osteoporosis without current pathological fracture: Secondary | ICD-10-CM | POA: Diagnosis not present

## 2022-12-25 ENCOUNTER — Encounter: Payer: Self-pay | Admitting: Nurse Practitioner

## 2022-12-25 ENCOUNTER — Ambulatory Visit (INDEPENDENT_AMBULATORY_CARE_PROVIDER_SITE_OTHER): Payer: Medicare Other | Admitting: Nurse Practitioner

## 2022-12-25 VITALS — BP 118/76 | HR 74 | Temp 97.8°F | Ht 64.0 in | Wt 127.0 lb

## 2022-12-25 DIAGNOSIS — M810 Age-related osteoporosis without current pathological fracture: Secondary | ICD-10-CM

## 2022-12-25 DIAGNOSIS — E785 Hyperlipidemia, unspecified: Secondary | ICD-10-CM

## 2022-12-25 DIAGNOSIS — I700 Atherosclerosis of aorta: Secondary | ICD-10-CM | POA: Diagnosis not present

## 2022-12-25 DIAGNOSIS — F321 Major depressive disorder, single episode, moderate: Secondary | ICD-10-CM | POA: Diagnosis not present

## 2022-12-25 DIAGNOSIS — Z636 Dependent relative needing care at home: Secondary | ICD-10-CM

## 2022-12-25 DIAGNOSIS — D709 Neutropenia, unspecified: Secondary | ICD-10-CM | POA: Insufficient documentation

## 2022-12-25 DIAGNOSIS — Z853 Personal history of malignant neoplasm of breast: Secondary | ICD-10-CM | POA: Diagnosis not present

## 2022-12-25 LAB — LIPID PANEL: Cholesterol: 195 mg/dL (ref <200)

## 2022-12-25 NOTE — Progress Notes (Unsigned)
Careteam: Patient Care Team: Sharon Seller, NP as PCP - General (Geriatric Medicine) Corky Crafts, MD as PCP - Cardiology (Cardiology) Levert Feinstein, MD as Consulting Physician (Neurology)  PLACE OF SERVICE:  [facility]  Advanced Directive information    Allergies  Allergen Reactions   Codeine     RINGING IN THE EARS.    Other Other (See Comments)    PAIN KILLERS   Oxycodone Itching   Sulfa Antibiotics Other (See Comments)   Ibuprofen Rash   Penicillins Swelling and Rash    Chief Complaint  Patient presents with   Follow-up    Discuss labs. Requesting reference ranges for blood sugar and blood pressure. Discuss elevated calcium levels as it relates to taking a calcium supplement. Discuss carotid salivatory gland.      HPI: Patient is a 72 y.o. female for routine follow up.   Her calcium level was mildly elevated last visit so she stopped calcium and has been off a couple of month.   Hyperlipidemia- started lipitor  in September after seeing cardiologist, she did not have a repeat lab until now but LDL now at goal.  No side effects noted.   Has a lot of stressing caregiving for her mom.  She was having more anxiety and depression- now other family members are helping.   She gets a blocked salivary duct that causes increase in pain, she is able to massage duct to help. She feels like maybe scar tissue is contributing to blockage dentist helped with maneuver.   Reports she is taking nicotine to help her focus- she is taking nic nacs which is a dissolvable nicotine.   Hx of breast cancer in 2001, continues yearly mammogram.    Review of Systems:  Review of Systems  Constitutional:  Negative for chills, fever and weight loss.  HENT:  Negative for tinnitus.   Respiratory:  Negative for cough, sputum production and shortness of breath.   Cardiovascular:  Negative for chest pain, palpitations and leg swelling.  Gastrointestinal:  Negative for abdominal  pain, constipation, diarrhea and heartburn.  Genitourinary:  Negative for dysuria, frequency and urgency.  Musculoskeletal:  Negative for back pain, falls, joint pain and myalgias.  Skin: Negative.   Neurological:  Negative for dizziness and headaches.  Psychiatric/Behavioral:  Negative for depression and memory loss. The patient is nervous/anxious. The patient does not have insomnia.     Past Medical History:  Diagnosis Date   Anxiety and depression    Breast cancer (HCC) 2001   RIGHT SIDE. S/P CHEMOTHERAPY   COVID 11/29/2020   Headache    Mitral valve regurgitation    Osteoporosis    Personal history of chemotherapy    Personal history of radiation therapy    Past Surgical History:  Procedure Laterality Date   APPENDECTOMY     AT AGE 42 YEARS OLD   BREAST LUMPECTOMY Right 2001   AT AGE 74 YEARS OLD   CHOLECYSTECTOMY     GALLBLADDER SURGERY  2012   Social History:   reports that she has never smoked. She has never used smokeless tobacco. She reports current alcohol use. She reports that she does not use drugs.  Family History  Problem Relation Age of Onset   Dementia Mother 56   Diabetes Mother    Hypertension Mother    Melanoma Father    Mental illness Father    CVA Father    Breast cancer Sister    Emphysema Paternal Grandmother  never smoker   Asthma Sister        had asthma as a child    Medications: Patient's Medications  New Prescriptions   No medications on file  Previous Medications   ASPIRIN EC 81 MG TABLET    Take 1 tablet (81 mg total) by mouth daily. Swallow whole.   ATORVASTATIN (LIPITOR) 10 MG TABLET    TAKE 1 TABLET BY MOUTH DAILY   B COMPLEX VITAMINS CAPSULE    Take 1 capsule by mouth daily.   BIOTIN 10 MG CAPS    Take by mouth.   CALCIUM CITRATE (CALCITRATE - DOSED IN MG ELEMENTAL CALCIUM) 950 (200 CA) MG TABLET    Take 200 mg of elemental calcium by mouth daily.   CHOLECALCIFEROL (VITAMIN D INFANT) 10 MCG/ML LIQD    Take 400 Units by  mouth daily.   CITALOPRAM (CELEXA) 40 MG TABLET    Take 1 tablet (40 mg total) by mouth daily.   COLLAGENASE POWD    by Does not apply route. Vital Protein Collagen Peptides 2 scoops into coffee daily   CYANOCOBALAMIN (VITAMIN B12) 1000 MCG TABLET    Take 1,000 mcg by mouth daily.   FOLIC ACID (FOLVITE) 400 MCG TABLET    Take 400 mcg by mouth daily.   IBUPROFEN (ADVIL) 800 MG TABLET    Take by mouth as needed.   IPRATROPIUM (ATROVENT) 0.03 % NASAL SPRAY    Place 2 sprays into both nostrils every 12 (twelve) hours.   METHOCARBAMOL (ROBAXIN) 500 MG TABLET    Take 1 tablet (500 mg total) by mouth daily as needed for muscle spasms.   MULTIPLE VITAMINS-MINERALS (ZINC PO)    Take 1 capsule by mouth daily.   OMEGA-3 FATTY ACIDS (OMEGA-3 PO)    Take 1 capsule by mouth daily.   ORAL ELECTROLYTES (LIQUID I.V. PO)    Take by mouth as needed (dehydration).   SACCHAROMYCES BOULARDII (FLORASTOR) 250 MG CAPSULE    Take 1 capsule (250 mg total) by mouth 2 (two) times daily.   TRAZODONE (DESYREL) 50 MG TABLET    Take 1 tablet (50 mg total) by mouth at bedtime.   WHEAT DEXTRIN (BENEFIBER DRINK MIX PO)    Take 1 Package by mouth daily.  Modified Medications   No medications on file  Discontinued Medications   PROBIOTIC PRODUCT (PROBIOTIC-10 PO)    Take 1 capsule by mouth 2 (two) times daily.    Physical Exam:  Vitals:   12/25/22 1109  BP: 118/76  Pulse: 74  Temp: 97.8 F (36.6 C)  TempSrc: Temporal  SpO2: (!) 0%  Weight: 127 lb (57.6 kg)  Height: 5\' 4"  (1.626 m)   Body mass index is 21.8 kg/m. Wt Readings from Last 3 Encounters:  12/25/22 127 lb (57.6 kg)  08/21/22 128 lb (58.1 kg)  08/14/22 128 lb 8 oz (58.3 kg)    Physical Exam Constitutional:      General: She is not in acute distress.    Appearance: She is well-developed. She is not diaphoretic.  HENT:     Head: Normocephalic and atraumatic.     Mouth/Throat:     Pharynx: No oropharyngeal exudate.  Eyes:     Conjunctiva/sclera:  Conjunctivae normal.     Pupils: Pupils are equal, round, and reactive to light.  Cardiovascular:     Rate and Rhythm: Normal rate and regular rhythm.     Heart sounds: Normal heart sounds.  Pulmonary:     Effort: Pulmonary  effort is normal.     Breath sounds: Normal breath sounds.  Abdominal:     General: Bowel sounds are normal.     Palpations: Abdomen is soft.  Musculoskeletal:     Cervical back: Normal range of motion and neck supple.     Right lower leg: No edema.     Left lower leg: No edema.  Skin:    General: Skin is warm and dry.  Neurological:     Mental Status: She is alert and oriented to person, place, and time.  Psychiatric:        Mood and Affect: Mood normal.     Labs reviewed: Basic Metabolic Panel: Recent Labs    12/29/21 1630 02/20/22 1449 12/24/22 1410  NA 135 135 134*  K 4.2 4.5 4.3  CL 97* 97* 97*  CO2 29 31 31   GLUCOSE 101* 86 100*  BUN 13 16 15   CREATININE 0.72 0.60 0.60  CALCIUM 10.4 10.7* 10.6*   Liver Function Tests: Recent Labs    02/20/22 1449 12/24/22 1410  AST 25 26  ALT 21 23  BILITOT 0.8 0.8  PROT 7.0 6.7   No results for input(s): "LIPASE", "AMYLASE" in the last 8760 hours. No results for input(s): "AMMONIA" in the last 8760 hours. CBC: Recent Labs    03/26/22 1502 05/12/22 1358 08/21/22 1453  WBC 4.0 5.5 4.5  NEUTROABS 2,372 3,790 2,709  HGB 13.0 12.6 12.7  HCT 38.4 36.9 38.0  MCV 88.5 88.1 87.2  PLT 284 213 227   Lipid Panel: Recent Labs    12/24/22 1410  CHOL 195  HDL 107  LDLCALC 74  TRIG 59  CHOLHDL 1.8   TSH: No results for input(s): "TSH" in the last 8760 hours. A1C: Lab Results  Component Value Date   HGBA1C 5.2 12/09/2021     Assessment/Plan 1. Current moderate episode of major depressive disorder, unspecified whether recurrent (HCC) Continues on celexa, now that siblings are helping with her mom symptoms have improved. Continue with lifestyle modifications   2. Aortic atherosclerosis  (HCC) Continues on asa with lipitor.   3. Osteoporosis without current pathological fracture, unspecified osteoporosis type -Recommended to take calcium 600 mg twice daily with Vitamin D 2000 units daily and weight bearing activity 30 mins/5 days a week  4. Hyperlipidemia LDL goal <70 -continues on lipitor   5. History of breast cancer Continues with yearly mammogram   6. Caregiver burden Increase stress due to caring for her mother, Now sharing the responsibilities of her mother with her siblings which has helped.    Return in about 6 months (around 06/27/2023) for routine follow up .  Mariah Romero. [facility] [phone]

## 2023-02-03 DIAGNOSIS — R509 Fever, unspecified: Secondary | ICD-10-CM | POA: Diagnosis not present

## 2023-02-03 DIAGNOSIS — R079 Chest pain, unspecified: Secondary | ICD-10-CM | POA: Diagnosis not present

## 2023-02-03 DIAGNOSIS — R0602 Shortness of breath: Secondary | ICD-10-CM | POA: Diagnosis not present

## 2023-02-03 DIAGNOSIS — M791 Myalgia, unspecified site: Secondary | ICD-10-CM | POA: Diagnosis not present

## 2023-02-03 DIAGNOSIS — Z20822 Contact with and (suspected) exposure to covid-19: Secondary | ICD-10-CM | POA: Diagnosis not present

## 2023-02-03 DIAGNOSIS — J988 Other specified respiratory disorders: Secondary | ICD-10-CM | POA: Diagnosis not present

## 2023-02-03 DIAGNOSIS — R9431 Abnormal electrocardiogram [ECG] [EKG]: Secondary | ICD-10-CM | POA: Diagnosis not present

## 2023-02-08 ENCOUNTER — Telehealth: Payer: Self-pay | Admitting: Interventional Cardiology

## 2023-02-08 NOTE — Telephone Encounter (Signed)
I spoke with patient. She reports she has been having palpitations for awhile.  Similar to what she had in the past.  Was on atenolol in the past but this has been stopped. [place] bus tour.  While there she started running a fever, felt achy and oxygen level was low.  She was evaluated in ED and diagnosed with Covid on 9/4. Reports EKG in the hospital was OK and she thinks it showed sinus rhythm with extra beats.  Advised to follow up with cardiology.  Patient returned home on 9/6.  Over the weekend she felt very fatigued with no energy.  Today she is still fatigued but has been having some lightheadedness.  Is staying hydrated and eating. Continues to feel weak overall.  Oxygen sat has improved to 97-98%.  BP and heart rate readings below.  She reports her BP cuff indicates extra heart beats.  Palpitations have not worsened since being diagnosed with Covid.   I told patient message would be set to Dr Eldridge Dace regarding palpitations.  I advised her other symptoms could be related to Covid and I asked her to follow up with PCP if weakness, fatigue and light headedness continue.

## 2023-02-08 NOTE — Telephone Encounter (Signed)
I agree that most of sx sound like COVD.  Palpitations could increase with COVID.  Skipped beats could have been related to COVID. Would not restart atenolol now while recovering from COVID.  WOuld wait and see if she still has palpitations after.  COuld then resume low dose atenolol.   She could f/u with any of the noninvasive MDs

## 2023-02-08 NOTE — Telephone Encounter (Signed)
I spoke with patient and gave her message from Dr Eldridge Dace.  She would like to follow up at [facility].  She will call us back and let us know if she would like to see Dr Cristal Deer or Dr Duke Salvia

## 2023-02-08 NOTE — Telephone Encounter (Signed)
STAT if patient feels like he/she is going to faint   1. Are you feeling dizzy, lightheaded, or faint right now? Feels lightheaded when she exerts herself.      2. Have you passed out?  no (If yes move to .[facility])   3. Do you have any other symptoms? No energy.    4. Have you checked your HR and BP (record if available)? Yesterday 137/72 HR 81 states she had extra heart beats. Checked it recently it was 112/62, HR 75 with extra heart beats.  She said she was on a trip recently and had to go to the emergency room because she wasn't feeling good and her O2 stats was 91-92% turns out she was positive for COVID.  They did an EKG and that came back OK but she did have extra heart beats.  They told her to follow up with her cardiologist when she got back.  She was running a fever when she got back that is why she called now.  She test positive for COVID last week on Wednesday.

## 2023-02-18 ENCOUNTER — Ambulatory Visit (HOSPITAL_BASED_OUTPATIENT_CLINIC_OR_DEPARTMENT_OTHER): Payer: Medicare Other | Admitting: Cardiology

## 2023-02-18 ENCOUNTER — Encounter (HOSPITAL_BASED_OUTPATIENT_CLINIC_OR_DEPARTMENT_OTHER): Payer: Self-pay | Admitting: Cardiology

## 2023-02-18 VITALS — BP 122/68 | HR 66 | Ht 64.0 in | Wt 127.0 lb

## 2023-02-18 DIAGNOSIS — E7800 Pure hypercholesterolemia, unspecified: Secondary | ICD-10-CM | POA: Diagnosis not present

## 2023-02-18 DIAGNOSIS — R002 Palpitations: Secondary | ICD-10-CM

## 2023-02-18 DIAGNOSIS — I340 Nonrheumatic mitral (valve) insufficiency: Secondary | ICD-10-CM | POA: Diagnosis not present

## 2023-02-18 DIAGNOSIS — I700 Atherosclerosis of aorta: Secondary | ICD-10-CM | POA: Diagnosis not present

## 2023-02-18 MED ORDER — ATENOLOL 25 MG PO TABS: 25.0000 mg | ORAL_TABLET | Freq: Every day | ORAL | 3 refills | Status: DC

## 2023-02-18 NOTE — Progress Notes (Signed)
Cardiology Office Note:  .   Date:  02/18/2023  ID:  Mariah Romero, DOB Sep 21, 1950, MRN [id number] PCP: Sharon Seller, NP  [facility] Providers Cardiologist:  Lance Muss, MD    History of Present Illness: .   Mariah Romero is a 72 y.o. female Discussed the use of AI scribe software for clinical note transcription with the patient, who gave verbal consent to proceed.  History of Present Illness   The patient, with a history of palpitations, mitral valve regurgitation, and a murmur since childhood, presents with a recurrence of palpitations. The patient reports that these palpitations are not painful but are distracting and concerning. The patient is highly sensitive and notices every skip in her heartbeat. The patient was previously on atenolol for these palpitations, which was effective in managing the symptoms. However, the patient stopped taking atenolol due to episodes of low blood pressure and lightheadedness.  The patient also has a history of breast cancer and received chemotherapy Adriamycin and radiation in 2001. The patient was informed that one of the chemotherapy drugs could potentially affect heart health, which has added to her concern about her current palpitations.  The patient reports feeling fatigued and needing to lie down for 10 to 30 minutes to recover. The patient also reports higher blood pressure since taking care of her mother who has dementia. The patient is currently on atorvastatin for high blood pressure.       ROS: No CP, no syncope  Studies Reviewed: Marland Kitchen   EKG Interpretation Date/Time:  Thursday February 18 2023 08:15:02 EDT Ventricular Rate:  64 PR Interval:  138 QRS Duration:  128 QT Interval:  446 QTC Calculation: 460 R Axis:   91  Text Interpretation: Sinus rhythm with Premature supraventricular complexes Right bundle branch block No previous ECGs available Confirmed by Donato Schultz ([id number]) on 02/18/2023 8:16:05 AM     LABS LDL: 74  RADIOLOGY Coronary CT: No coronary calcification  DIAGNOSTIC Echocardiogram: Mild mitral valve regurgitation (2018) EKG: Occasional premature beats (02/18/2023)  Risk Assessment/Calculations:            Physical Exam:   VS:  BP 122/68   Pulse 66   Ht 5\' 4"  (1.626 m)   Wt 127 lb (57.6 kg)   SpO2 98%   BMI 21.80 kg/m    Wt Readings from Last 3 Encounters:  02/18/23 127 lb (57.6 kg)  12/25/22 127 lb (57.6 kg)  08/21/22 128 lb (58.1 kg)    GEN: Well nourished, well developed in no acute distress NECK: No JVD; No carotid bruits CARDIAC: RRR, soft systolic murmur, rubs, gallops RESPIRATORY:  Clear to auscultation without rales, wheezing or rhonchi  ABDOMEN: Soft, non-tender, non-distended EXTREMITIES:  No edema; No deformity   ASSESSMENT AND PLAN: .    Assessment and Plan    Palpitations History of palpitations, previously managed with Atenolol. Currently experiencing increased frequency of palpitations, causing distress. No associated chest pain or shortness of breath reported. -Order Echocardiogram to assess cardiac structure and function. -Resume Atenolol 25mg  daily as needed for palpitations. -Consider daily intake of a banana and a magnesium supplement (200mg ) for potential benefit on palpitations.  Pure hypercholesterolemia Continue with atorvastatin.  Excellent  Mitral Valve Regurgitation History of mild mitral valve regurgitation, last echocardiogram in 2018. No current symptoms suggestive of progression. -Order Echocardiogram to assess current status of mitral valve regurgitation.  Hypertension Blood pressure readings provided by the patient show some elevated readings.  Overall very normal.  At  sometimes low.  Watch with atenolol.  Follow-up in 1 year or sooner if symptoms worsen.            Dispo: 1 yr me or APP  Signed, Donato Schultz, MD

## 2023-02-18 NOTE — Patient Instructions (Signed)
Medication Instructions:  Please start Atenolol 25 mg once daily. Continue all other medications as listed.  May take OTC Magnesium Glycinate or oxide.  *If you need a refill on your cardiac medications before your next appointment, please call your pharmacy*   Testing/Procedures: Your physician has requested that you have an echocardiogram. Echocardiography is a painless test that uses sound waves to create images of your heart. It provides your doctor with information about the size and shape of your heart and how well your heart's chambers and valves are working. This procedure takes approximately one hour. There are no restrictions for this procedure. Please do NOT wear cologne, perfume, aftershave, or lotions (deodorant is allowed). Please arrive 15 minutes prior to your appointment time.   Follow-Up: At [facility], you and your health needs are our priority.  As part of our continuing mission to provide you with exceptional heart care, we have created designated Provider Care Teams.  These Care Teams include your primary Cardiologist (physician) and Advanced Practice Providers (APPs -  Physician Assistants and Nurse Practitioners) who all work together to provide you with the care you need, when you need it.  We recommend signing up for the patient portal called "MyChart".  Sign up information is provided on this After Visit Summary.  MyChart is used to connect with patients for Virtual Visits (Telemedicine).  Patients are able to view lab/test results, encounter notes, upcoming appointments, etc.  Non-urgent messages can be sent to your provider as well.   To learn more about what you can do with MyChart, go to [email or web address].    Your next appointment:   1 year(s)  Provider:   Dr Donato Schultz  or APP

## 2023-03-01 ENCOUNTER — Other Ambulatory Visit: Payer: Self-pay | Admitting: Nurse Practitioner

## 2023-03-01 DIAGNOSIS — F330 Major depressive disorder, recurrent, mild: Secondary | ICD-10-CM

## 2023-03-01 NOTE — Telephone Encounter (Signed)
High Risk Warning Populated when attempting to refill, I will send to Provider for further review 

## 2023-03-15 ENCOUNTER — Ambulatory Visit (HOSPITAL_BASED_OUTPATIENT_CLINIC_OR_DEPARTMENT_OTHER): Payer: Medicare Other

## 2023-03-15 DIAGNOSIS — I340 Nonrheumatic mitral (valve) insufficiency: Secondary | ICD-10-CM

## 2023-03-15 LAB — ECHOCARDIOGRAM COMPLETE
Area-P 1/2: 5.84 cm2
MV M vel: 5.17 m/s
MV Peak grad: 106.9 mm[Hg]
P 1/2 time: 472 ms
Radius: 0.5 cm
S' Lateral: 2.47 cm

## 2023-03-19 ENCOUNTER — Telehealth: Payer: Self-pay | Admitting: Cardiology

## 2023-03-19 NOTE — Telephone Encounter (Signed)
Spoke with patient and she is aware of Echo results. Tried to schedule patient but she stated you said something about an appointment on the 25th of this month. Who was this with? Can you return call and schedule her.

## 2023-03-19 NOTE — Telephone Encounter (Signed)
Patient is calling to talk with Dr. Anne Fu or nurse in regards to an appointment that was told to her about 10/25. Refuse to accept another appt because she said that Dr. Anne Fu wants to see her ASAP. Wants to talk with another nurse if Avie Arenas is not available.

## 2023-03-19 NOTE — Telephone Encounter (Signed)
Patient is returning call regarding echo results.  

## 2023-03-19 NOTE — Telephone Encounter (Signed)
Patient scheduled for 10/25 at 3:30

## 2023-03-23 NOTE — Telephone Encounter (Signed)
Pt is scheduled to see Dr Anne Fu 10/25 for H&P, EKG and lab in order to be scheduled for TEE.

## 2023-03-26 ENCOUNTER — Encounter: Payer: Self-pay | Admitting: Cardiology

## 2023-03-26 ENCOUNTER — Encounter: Payer: Self-pay | Admitting: *Deleted

## 2023-03-26 ENCOUNTER — Ambulatory Visit: Payer: Medicare Other | Attending: Cardiology | Admitting: Cardiology

## 2023-03-26 VITALS — BP 126/60 | HR 68 | Ht 64.0 in | Wt 128.0 lb

## 2023-03-26 DIAGNOSIS — I340 Nonrheumatic mitral (valve) insufficiency: Secondary | ICD-10-CM | POA: Diagnosis not present

## 2023-03-26 DIAGNOSIS — Z01812 Encounter for preprocedural laboratory examination: Secondary | ICD-10-CM | POA: Diagnosis not present

## 2023-03-26 DIAGNOSIS — R002 Palpitations: Secondary | ICD-10-CM

## 2023-03-26 NOTE — Progress Notes (Signed)
Cardiology Office Note:  .   Date:  03/26/2023  ID:  Mariah Romero, DOB 01-14-1951, MRN [id number] PCP: Sharon Seller, NP  [facility] Providers Cardiologist:  Lance Muss, MD    History of Present Illness: .   Mariah Romero is a 72 y.o. female Discussed with the use of AI scribe  History of Present Illness    72 year old female former patient of Dr. Hoyle Barr who has what appears to be severe mitral regurgitation on transthoracic echocardiogram.  She is here today to talk about TEE.  Discussed alternatives to therapy such as MRI and we mutually decided upon TEE.  She has had a prior history of breast cancer with radiation  She has had palpitations that have improved with atenolol.   She mentions a decrease in stamina, particularly when climbing stairs or walking around her two-level home, often needing to rest afterwards. However, she does not report any shortness of breath or other symptoms of heart failure.  The patient has a history of a heart murmur since childhood and aortic valve calcification, discovered during a previous CVS for diverticulitis. She expresses concern about the potential impact of previous radiation therapy on her heart valves. The patient also mentions taking nicotine in the form of 'knickknacks' to help with focus and task completion, and inquires about potential cardiac effects.  The patient is agreeable to further evaluation of the mitral valve leak with a transesophageal echocardiogram (TEE), expressing a preference for this over a cardiac MRI due to the shorter procedure time and immediate results.          ROS: No syncope, no bleeding.   Studies Reviewed: Marland Kitchen   EKG Interpretation Date/Time:  Friday March 26 2023 16:08:16 EDT Ventricular Rate:  65 PR Interval:  134 QRS Duration:  136 QT Interval:  456 QTC Calculation: 474 R Axis:   86  Text Interpretation: Normal sinus rhythm Right bundle branch block When compared with  ECG of 18-Feb-2023 08:15, Premature supraventricular complexes are no longer Present Confirmed by Donato Schultz ([id number]) on 03/26/2023 4:21:05 PM    Cardiac Studies & Procedures       ECHOCARDIOGRAM  ECHOCARDIOGRAM COMPLETE 03/15/2023  Narrative ECHOCARDIOGRAM REPORT    Patient Name:   Mariah Romero Date of Exam: 03/15/2023 Medical Rec #:  [id number]        Height:       64.0 in Accession #:    [id number]       Weight:       127.0 lb Date of Birth:  1950/06/07         BSA:          1.613 m Patient Age:    72 years         BP:           0/0 mmHg Patient Gender: F                HR:           60 bpm. Exam Location:  Outpatient  Procedure: 2D Echo  Indications:    Mitral regurgitation  History:        Patient has no prior history of Echocardiogram examinations. Mitral Valve Disease.  Sonographer:    Data processing manager Referring Phys: [id number] Timea Breed C Kaushik Maul  IMPRESSIONS   1. Left ventricular ejection fraction, by estimation, is 60 to 65%. The left ventricle has normal function. The left ventricle has no regional wall motion abnormalities. Left ventricular diastolic  parameters are consistent with Grade I diastolic dysfunction (impaired relaxation). 2. Right ventricular systolic function is normal. The right ventricular size is normal. 3. Left atrial size was mildly dilated. 4. The mitral valve is abnormal. Moderate to severe mitral valve regurgitation. No evidence of mitral stenosis. 5. The aortic valve is tricuspid. Aortic valve regurgitation is mild. No aortic stenosis is present. 6. Multiple jets of pulmonic regurgitation. 7. The inferior vena cava is normal in size with greater than 50% respiratory variability, suggesting right atrial pressure of 3 mmHg.  Comparison(s): No prior Echocardiogram.  Conclusion(s)/Recommendation(s): Consider TEE or CMR for further MR characterization.  FINDINGS Left Ventricle: Left ventricular ejection fraction, by estimation, is 60 to  65%. The left ventricle has normal function. The left ventricle has no regional wall motion abnormalities. Global longitudinal strain performed but not reported based on interpreter judgement due to suboptimal tracking. 3D ejection fraction reviewed and evaluated as part of the interpretation. Alternate measurement of EF is felt to be most reflective of LV function. The left ventricular internal cavity size was normal in size. There is no left ventricular hypertrophy. Left ventricular diastolic parameters are consistent with Grade I diastolic dysfunction (impaired relaxation).  Right Ventricle: The right ventricular size is normal. No increase in right ventricular wall thickness. Right ventricular systolic function is normal.  Left Atrium: Left atrial size was mildly dilated.  Right Atrium: Right atrial size was normal in size.  Pericardium: There is no evidence of pericardial effusion.  Mitral Valve: Mitral valve prolapse noted, mitral regurgitation much more prominent in the the apical images. The mitral valve is abnormal. Moderate to severe mitral valve regurgitation. No evidence of mitral valve stenosis.  Tricuspid Valve: The tricuspid valve is normal in structure. Tricuspid valve regurgitation is trivial. No evidence of tricuspid stenosis.  Aortic Valve: The aortic valve is tricuspid. Aortic valve regurgitation is mild. Aortic regurgitation PHT measures 472 msec. No aortic stenosis is present.  Pulmonic Valve: Multiple jets of pulmonic regurgitation. The pulmonic valve was normal in structure. Pulmonic valve regurgitation is mild to moderate. No evidence of pulmonic stenosis.  Aorta: The aortic root is normal in size and structure.  Venous: The inferior vena cava is normal in size with greater than 50% respiratory variability, suggesting right atrial pressure of 3 mmHg.  IAS/Shunts: No atrial level shunt detected by color flow Doppler.   LEFT VENTRICLE PLAX 2D LVIDd:         4.99 cm    Diastology LVIDs:         2.47 cm   LV e' medial:    7.29 cm/s LV PW:         0.91 cm   LV E/e' medial:  11.1 LV IVS:        0.74 cm   LV e' lateral:   7.62 cm/s LVOT diam:     1.90 cm   LV E/e' lateral: 10.6 LV SV:         76 LV SV Index:   47 LVOT Area:     2.84 cm  3D Volume EF: 3D EF:        53 % LV EDV:       115 ml LV ESV:       55 ml LV SV:        60 ml  RIGHT VENTRICLE RV Basal diam:  3.93 cm RV Mid diam:    2.58 cm RV S prime:     11.00 cm/s TAPSE (M-mode): 2.2 cm  LEFT ATRIUM             Index        RIGHT ATRIUM           Index LA diam:        3.60 cm 2.23 cm/m   RA Area:     11.10 cm LA Vol (A2C):   64.0 ml 39.68 ml/m  RA Volume:   21.50 ml  13.33 ml/m LA Vol (A4C):   42.9 ml 26.60 ml/m LA Biplane Vol: 54.7 ml 33.91 ml/m AORTIC VALVE LVOT Vmax:   120.00 cm/s LVOT Vmean:  76.700 cm/s LVOT VTI:    0.269 m AI PHT:      472 msec  AORTA Ao Root diam: 2.90 cm Ao Asc diam:  3.40 cm  MITRAL VALVE                  TRICUSPID VALVE MV Area (PHT): 5.84 cm       TR Peak grad:   25.0 mmHg MV Decel Time: 130 msec       TR Vmax:        250.00 cm/s MR Peak grad:    106.9 mmHg MR Mean grad:    82.0 mmHg    SHUNTS MR Vmax:         517.00 cm/s  Systemic VTI:  0.27 m MR Vmean:        440.0 cm/s   Systemic Diam: 1.90 cm MR PISA:         1.57 cm MR PISA Eff ROA: 9 mm MR PISA Radius:  0.50 cm MV E velocity: 81.10 cm/s MV A velocity: 50.70 cm/s MV E/A ratio:  1.60  Riley Lam MD Electronically signed by Riley Lam MD Signature Date/Time: 03/15/2023/3:40:24 PM    Final     CT SCANS  CT CARDIAC SCORING (SELF PAY ONLY) 03/26/2022  Addendum 03/30/2022 11:07 AM ADDENDUM REPORT: 03/30/2022 11:04  CLINICAL DATA:  66M for cardiovascular disease risk stratification  EXAM: Coronary Calcium Score  TECHNIQUE: A gated, non-contrast computed tomography scan of the heart was performed using 3mm slice thickness. Axial images were analyzed on  a dedicated workstation. Calcium scoring of the coronary arteries was performed using the Agatston method.  FINDINGS: Coronary arteries: Normal origins.  Coronary Calcium Score: 0  Percentile: 1st  Pericardium: Normal.  Ascending Aorta: Normal caliber.  Aortic atherosclerosis.  Non-cardiac: See separate report from [facility].  IMPRESSION: Coronary calcium score of 0. This was 1st percentile for age-, race-, and sex-matched controls.  Mild aortic atherosclerosis.  RECOMMENDATIONS: Coronary artery calcium (CAC) score is a strong predictor of incident coronary heart disease (CHD) and provides predictive information beyond traditional risk factors. CAC scoring is reasonable to use in the decision to withhold, postpone, or initiate statin therapy in intermediate-risk or selected borderline-risk asymptomatic adults (age 3-75 years and LDL-C >=70 to <190 mg/dL) who do not have diabetes or established atherosclerotic cardiovascular disease (ASCVD).* In intermediate-risk (10-year ASCVD risk >=7.5% to <20%) adults or selected borderline-risk (10-year ASCVD risk >=5% to <7.5%) adults in whom a CAC score is measured for the purpose of making a treatment decision the following recommendations have been made:  If CAC=0, it is reasonable to withhold statin therapy and reassess in 5 to 10 years, as long as higher risk conditions are absent (diabetes mellitus, family history of premature CHD in first degree relatives (males <55 years; females <65 years), cigarette smoking, or LDL >=190 mg/dL).  If CAC is 1  to 29, it is reasonable to initiate statin therapy for patients >=64 years of age.  If CAC is >=100 or >=75th percentile, it is reasonable to initiate statin therapy at any age.  Cardiology referral should be considered for patients with CAC scores >=400 or >=75th percentile.  *2018 AHA/ACC/AACVPR/AAPA/ABC/ACPM/ADA/AGS/APhA/ASPC/NLA/PCNA Guideline on the Management  of Blood Cholesterol: A Report of the American College of Cardiology/American Heart Association Task Force on Clinical Practice Guidelines. J Am Coll Cardiol. 2019;73(24):3168-3209.  Chilton Si, MD   Electronically Signed By: Chilton Si M.D. On: 03/30/2022 11:04  Narrative EXAM: OVER-READ INTERPRETATION  CT CHEST  The following report is an over-read performed by radiologist Dr. Marliss Coots of [facility] on 03/30/2022. This over-read does not include interpretation of cardiac or coronary anatomy or pathology. The coronary calcium score interpretation by the cardiologist is attached.  COMPARISON:  None Available.  FINDINGS: Cardiovascular: The heart is normal in size.  Mediastinum/Nodes: Visualized esophagus and trachea within normal limits. No mediastinal lymphadenopathy.  Lungs/Pleura: The lungs are clear bilaterally.  Upper Abdomen: The visualized upper abdomen is within normal limits.  Musculoskeletal: No acute osseous abnormality.  IMPRESSION: No acute or significant intrathoracic abnormality.  Marliss Coots, MD  [facility]  [facility]  Electronically Signed: By: Marliss Coots M.D. On: 03/30/2022 08:50          Risk Assessment/Calculations:            Physical Exam:   VS:  BP 126/60   Pulse 68   Ht 5\' 4"  (1.626 m)   Wt 128 lb (58.1 kg)   SpO2 99%   BMI 21.97 kg/m    Wt Readings from Last 3 Encounters:  03/26/23 128 lb (58.1 kg)  02/18/23 127 lb (57.6 kg)  12/25/22 127 lb (57.6 kg)    GEN: Well nourished, well developed in no acute distress NECK: No JVD; No carotid bruits CARDIAC: RRR, 2/6 SM, no rubs, no gallops RESPIRATORY:  Clear to auscultation without rales, wheezing or rhonchi  ABDOMEN: Soft, non-tender, non-distended EXTREMITIES:  No edema; No deformity   ASSESSMENT AND PLAN: .    Assessment and Plan    Mitral Regurgitation Increased severity from mild  to now moderate to severe.  No severe symptoms reported, but patient notes increased fatigue and decreased stamina. -Schedule Transesophageal Echocardiogram (TEE) within the next two weeks to better visualize and assess the severity of the mitral regurgitation. -If TEE confirms severe regurgitation, consider consultation with a valve surgeon for potential repair.  Nicotine Use Patient reports using nicotine lozenges (up to 6mg  daily) for focus and task completion. No known coronary disease.   Palpitations  Patient reports occasional palpitations, improved with Atenolol. -Continue Atenolol as prescribed. Monitor for changes in frequency or severity of palpitations.           Informed Consent   Shared Decision Making/Informed Consent The risks [esophageal damage, perforation (1:10,000 risk), bleeding, pharyngeal hematoma as well as other potential complications associated with conscious sedation including aspiration, arrhythmia, respiratory failure and death], benefits (treatment guidance and diagnostic support) and alternatives of a transesophageal echocardiogram were discussed in detail with Ms. Noah and she is willing to proceed.        Signed, Donato Schultz, MD

## 2023-03-26 NOTE — OR Nursing (Addendum)
Called patient with pre-procedure instructions for tomorrow.   Patient informed of:   Time to arrive for procedure. 1200 Remain NPO past midnight.  Must have a ride home and a responsible adult to remain with them for 24 ours post procedure.  Confirmed blood thinner. Confirmed no breaks in taking blood thinner for 3+ weeks prior to procedure. Confirmed patient stopped all GLP-1s and GLP-2s for at least one week before procedure.   Left message for patient regarding above information. Instructed to call back with any questions.

## 2023-03-26 NOTE — H&P (View-Only) (Signed)
Cardiology Office Note:  .   Date:  03/26/2023  ID:  Mariah Romero, DOB 01-14-1951, MRN [id number] PCP: Sharon Seller, NP  [facility] Providers Cardiologist:  Lance Muss, MD    History of Present Illness: .   Mariah Romero is a 72 y.o. female Discussed with the use of AI scribe  History of Present Illness    72 year old female former patient of Dr. Hoyle Barr who has what appears to be severe mitral regurgitation on transthoracic echocardiogram.  She is here today to talk about TEE.  Discussed alternatives to therapy such as MRI and we mutually decided upon TEE.  She has had a prior history of breast cancer with radiation  She has had palpitations that have improved with atenolol.   She mentions a decrease in stamina, particularly when climbing stairs or walking around her two-level home, often needing to rest afterwards. However, she does not report any shortness of breath or other symptoms of heart failure.  The patient has a history of a heart murmur since childhood and aortic valve calcification, discovered during a previous CVS for diverticulitis. She expresses concern about the potential impact of previous radiation therapy on her heart valves. The patient also mentions taking nicotine in the form of 'knickknacks' to help with focus and task completion, and inquires about potential cardiac effects.  The patient is agreeable to further evaluation of the mitral valve leak with a transesophageal echocardiogram (TEE), expressing a preference for this over a cardiac MRI due to the shorter procedure time and immediate results.          ROS: No syncope, no bleeding.   Studies Reviewed: Marland Kitchen   EKG Interpretation Date/Time:  Friday March 26 2023 16:08:16 EDT Ventricular Rate:  65 PR Interval:  134 QRS Duration:  136 QT Interval:  456 QTC Calculation: 474 R Axis:   86  Text Interpretation: Normal sinus rhythm Right bundle branch block When compared with  ECG of 18-Feb-2023 08:15, Premature supraventricular complexes are no longer Present Confirmed by Donato Schultz ([id number]) on 03/26/2023 4:21:05 PM    Cardiac Studies & Procedures       ECHOCARDIOGRAM  ECHOCARDIOGRAM COMPLETE 03/15/2023  Narrative ECHOCARDIOGRAM REPORT    Patient Name:   Mariah Romero Date of Exam: 03/15/2023 Medical Rec #:  [id number]        Height:       64.0 in Accession #:    [id number]       Weight:       127.0 lb Date of Birth:  1950/06/07         BSA:          1.613 m Patient Age:    72 years         BP:           0/0 mmHg Patient Gender: F                HR:           60 bpm. Exam Location:  Outpatient  Procedure: 2D Echo  Indications:    Mitral regurgitation  History:        Patient has no prior history of Echocardiogram examinations. Mitral Valve Disease.  Sonographer:    Data processing manager Referring Phys: [id number] Timea Breed C Kaushik Maul  IMPRESSIONS   1. Left ventricular ejection fraction, by estimation, is 60 to 65%. The left ventricle has normal function. The left ventricle has no regional wall motion abnormalities. Left ventricular diastolic  parameters are consistent with Grade I diastolic dysfunction (impaired relaxation). 2. Right ventricular systolic function is normal. The right ventricular size is normal. 3. Left atrial size was mildly dilated. 4. The mitral valve is abnormal. Moderate to severe mitral valve regurgitation. No evidence of mitral stenosis. 5. The aortic valve is tricuspid. Aortic valve regurgitation is mild. No aortic stenosis is present. 6. Multiple jets of pulmonic regurgitation. 7. The inferior vena cava is normal in size with greater than 50% respiratory variability, suggesting right atrial pressure of 3 mmHg.  Comparison(s): No prior Echocardiogram.  Conclusion(s)/Recommendation(s): Consider TEE or CMR for further MR characterization.  FINDINGS Left Ventricle: Left ventricular ejection fraction, by estimation, is 60 to  65%. The left ventricle has normal function. The left ventricle has no regional wall motion abnormalities. Global longitudinal strain performed but not reported based on interpreter judgement due to suboptimal tracking. 3D ejection fraction reviewed and evaluated as part of the interpretation. Alternate measurement of EF is felt to be most reflective of LV function. The left ventricular internal cavity size was normal in size. There is no left ventricular hypertrophy. Left ventricular diastolic parameters are consistent with Grade I diastolic dysfunction (impaired relaxation).  Right Ventricle: The right ventricular size is normal. No increase in right ventricular wall thickness. Right ventricular systolic function is normal.  Left Atrium: Left atrial size was mildly dilated.  Right Atrium: Right atrial size was normal in size.  Pericardium: There is no evidence of pericardial effusion.  Mitral Valve: Mitral valve prolapse noted, mitral regurgitation much more prominent in the the apical images. The mitral valve is abnormal. Moderate to severe mitral valve regurgitation. No evidence of mitral valve stenosis.  Tricuspid Valve: The tricuspid valve is normal in structure. Tricuspid valve regurgitation is trivial. No evidence of tricuspid stenosis.  Aortic Valve: The aortic valve is tricuspid. Aortic valve regurgitation is mild. Aortic regurgitation PHT measures 472 msec. No aortic stenosis is present.  Pulmonic Valve: Multiple jets of pulmonic regurgitation. The pulmonic valve was normal in structure. Pulmonic valve regurgitation is mild to moderate. No evidence of pulmonic stenosis.  Aorta: The aortic root is normal in size and structure.  Venous: The inferior vena cava is normal in size with greater than 50% respiratory variability, suggesting right atrial pressure of 3 mmHg.  IAS/Shunts: No atrial level shunt detected by color flow Doppler.   LEFT VENTRICLE PLAX 2D LVIDd:         4.99 cm    Diastology LVIDs:         2.47 cm   LV e' medial:    7.29 cm/s LV PW:         0.91 cm   LV E/e' medial:  11.1 LV IVS:        0.74 cm   LV e' lateral:   7.62 cm/s LVOT diam:     1.90 cm   LV E/e' lateral: 10.6 LV SV:         76 LV SV Index:   47 LVOT Area:     2.84 cm  3D Volume EF: 3D EF:        53 % LV EDV:       115 ml LV ESV:       55 ml LV SV:        60 ml  RIGHT VENTRICLE RV Basal diam:  3.93 cm RV Mid diam:    2.58 cm RV S prime:     11.00 cm/s TAPSE (M-mode): 2.2 cm  LEFT ATRIUM             Index        RIGHT ATRIUM           Index LA diam:        3.60 cm 2.23 cm/m   RA Area:     11.10 cm LA Vol (A2C):   64.0 ml 39.68 ml/m  RA Volume:   21.50 ml  13.33 ml/m LA Vol (A4C):   42.9 ml 26.60 ml/m LA Biplane Vol: 54.7 ml 33.91 ml/m AORTIC VALVE LVOT Vmax:   120.00 cm/s LVOT Vmean:  76.700 cm/s LVOT VTI:    0.269 m AI PHT:      472 msec  AORTA Ao Root diam: 2.90 cm Ao Asc diam:  3.40 cm  MITRAL VALVE                  TRICUSPID VALVE MV Area (PHT): 5.84 cm       TR Peak grad:   25.0 mmHg MV Decel Time: 130 msec       TR Vmax:        250.00 cm/s MR Peak grad:    106.9 mmHg MR Mean grad:    82.0 mmHg    SHUNTS MR Vmax:         517.00 cm/s  Systemic VTI:  0.27 m MR Vmean:        440.0 cm/s   Systemic Diam: 1.90 cm MR PISA:         1.57 cm MR PISA Eff ROA: 9 mm MR PISA Radius:  0.50 cm MV E velocity: 81.10 cm/s MV A velocity: 50.70 cm/s MV E/A ratio:  1.60  Riley Lam MD Electronically signed by Riley Lam MD Signature Date/Time: 03/15/2023/3:40:24 PM    Final     CT SCANS  CT CARDIAC SCORING (SELF PAY ONLY) 03/26/2022  Addendum 03/30/2022 11:07 AM ADDENDUM REPORT: 03/30/2022 11:04  CLINICAL DATA:  66M for cardiovascular disease risk stratification  EXAM: Coronary Calcium Score  TECHNIQUE: A gated, non-contrast computed tomography scan of the heart was performed using 3mm slice thickness. Axial images were analyzed on  a dedicated workstation. Calcium scoring of the coronary arteries was performed using the Agatston method.  FINDINGS: Coronary arteries: Normal origins.  Coronary Calcium Score: 0  Percentile: 1st  Pericardium: Normal.  Ascending Aorta: Normal caliber.  Aortic atherosclerosis.  Non-cardiac: See separate report from [facility].  IMPRESSION: Coronary calcium score of 0. This was 1st percentile for age-, race-, and sex-matched controls.  Mild aortic atherosclerosis.  RECOMMENDATIONS: Coronary artery calcium (CAC) score is a strong predictor of incident coronary heart disease (CHD) and provides predictive information beyond traditional risk factors. CAC scoring is reasonable to use in the decision to withhold, postpone, or initiate statin therapy in intermediate-risk or selected borderline-risk asymptomatic adults (age 3-75 years and LDL-C >=70 to <190 mg/dL) who do not have diabetes or established atherosclerotic cardiovascular disease (ASCVD).* In intermediate-risk (10-year ASCVD risk >=7.5% to <20%) adults or selected borderline-risk (10-year ASCVD risk >=5% to <7.5%) adults in whom a CAC score is measured for the purpose of making a treatment decision the following recommendations have been made:  If CAC=0, it is reasonable to withhold statin therapy and reassess in 5 to 10 years, as long as higher risk conditions are absent (diabetes mellitus, family history of premature CHD in first degree relatives (males <55 years; females <65 years), cigarette smoking, or LDL >=190 mg/dL).  If CAC is 1  to 29, it is reasonable to initiate statin therapy for patients >=64 years of age.  If CAC is >=100 or >=75th percentile, it is reasonable to initiate statin therapy at any age.  Cardiology referral should be considered for patients with CAC scores >=400 or >=75th percentile.  *2018 AHA/ACC/AACVPR/AAPA/ABC/ACPM/ADA/AGS/APhA/ASPC/NLA/PCNA Guideline on the Management  of Blood Cholesterol: A Report of the American College of Cardiology/American Heart Association Task Force on Clinical Practice Guidelines. J Am Coll Cardiol. 2019;73(24):3168-3209.  Chilton Si, MD   Electronically Signed By: Chilton Si M.D. On: 03/30/2022 11:04  Narrative EXAM: OVER-READ INTERPRETATION  CT CHEST  The following report is an over-read performed by radiologist Dr. Marliss Coots of [facility] on 03/30/2022. This over-read does not include interpretation of cardiac or coronary anatomy or pathology. The coronary calcium score interpretation by the cardiologist is attached.  COMPARISON:  None Available.  FINDINGS: Cardiovascular: The heart is normal in size.  Mediastinum/Nodes: Visualized esophagus and trachea within normal limits. No mediastinal lymphadenopathy.  Lungs/Pleura: The lungs are clear bilaterally.  Upper Abdomen: The visualized upper abdomen is within normal limits.  Musculoskeletal: No acute osseous abnormality.  IMPRESSION: No acute or significant intrathoracic abnormality.  Marliss Coots, MD  [facility]  [facility]  Electronically Signed: By: Marliss Coots M.D. On: 03/30/2022 08:50          Risk Assessment/Calculations:            Physical Exam:   VS:  BP 126/60   Pulse 68   Ht 5\' 4"  (1.626 m)   Wt 128 lb (58.1 kg)   SpO2 99%   BMI 21.97 kg/m    Wt Readings from Last 3 Encounters:  03/26/23 128 lb (58.1 kg)  02/18/23 127 lb (57.6 kg)  12/25/22 127 lb (57.6 kg)    GEN: Well nourished, well developed in no acute distress NECK: No JVD; No carotid bruits CARDIAC: RRR, 2/6 SM, no rubs, no gallops RESPIRATORY:  Clear to auscultation without rales, wheezing or rhonchi  ABDOMEN: Soft, non-tender, non-distended EXTREMITIES:  No edema; No deformity   ASSESSMENT AND PLAN: .    Assessment and Plan    Mitral Regurgitation Increased severity from mild  to now moderate to severe.  No severe symptoms reported, but patient notes increased fatigue and decreased stamina. -Schedule Transesophageal Echocardiogram (TEE) within the next two weeks to better visualize and assess the severity of the mitral regurgitation. -If TEE confirms severe regurgitation, consider consultation with a valve surgeon for potential repair.  Nicotine Use Patient reports using nicotine lozenges (up to 6mg  daily) for focus and task completion. No known coronary disease.   Palpitations  Patient reports occasional palpitations, improved with Atenolol. -Continue Atenolol as prescribed. Monitor for changes in frequency or severity of palpitations.           Informed Consent   Shared Decision Making/Informed Consent The risks [esophageal damage, perforation (1:10,000 risk), bleeding, pharyngeal hematoma as well as other potential complications associated with conscious sedation including aspiration, arrhythmia, respiratory failure and death], benefits (treatment guidance and diagnostic support) and alternatives of a transesophageal echocardiogram were discussed in detail with Ms. Noah and she is willing to proceed.        Signed, Donato Schultz, MD

## 2023-03-26 NOTE — Patient Instructions (Signed)
Medication Instructions:  The current medical regimen is effective;  continue present plan and medications.  *If you need a refill on your cardiac medications before your next appointment, please call your pharmacy*   Lab Work: Please have blood work today  (CBC, BMP)  If you have labs (blood work) drawn today and your tests are completely normal, you will receive your results only by: MyChart Message (if you have MyChart) OR A paper copy in the mail If you have any lab test that is abnormal or we need to change your treatment, we will call you to review the results.   Testing/Procedures: Your physician has requested that you have a TEE. During a TEE, sound waves are used to create images of your heart. It provides your doctor with information about the size and shape of your heart and how well your heart's chambers and valves are working. In this test, a transducer is attached to the end of a flexible tube that's guided down your throat and into your esophagus (the tube leading from you mouth to your stomach) to get a more detailed image of your heart. You are not awake for the procedure. Please see the instruction sheet given to you today. For further information please visit [email or web address]     Dear Mariah Romero [facility]  You are scheduled for a TEE (Transesophageal Echocardiogram) on Monday, October 28 with Dr. Rennis Golden.   Please arrive at the [facility] (Main Entrance A) at [facility]: [address] [place], [place] [address] at 12:15 pm (This time is 1 hour before your procedure to ensure your preparation). Free valet parking service is available. You will check in at ADMITTING. The support person will be asked to wait in the waiting room.  It is OK to have someone drop you off and come back when you are ready to be discharged.     DIET:  Nothing to eat or drink after midnight except a sip of water with medications (see medication instructions below)  MEDICATION  INSTRUCTIONS: May take morning medications with sips of water.  FYI:  For your safety, and to allow [place] to monitor your vital signs accurately during the surgery/procedure we request: If you have artificial nails, gel coating, SNS etc, please have those removed prior to your surgery/procedure. Not having the nail coverings /polish removed may result in cancellation or delay of your surgery/procedure.  You must have a responsible person to drive you home and stay in the waiting area during your procedure. Failure to do so could result in cancellation.  Bring your insurance cards.  *Special Note: Every effort is made to have your procedure done on time. Occasionally there are emergencies that occur at the hospital that may cause delays. Please be patient if a delay does occur.   Follow-Up: At [facility], you and your health needs are our priority.  As part of our continuing mission to provide you with exceptional heart care, we have created designated Provider Care Teams.  These Care Teams include your primary Cardiologist (physician) and Advanced Practice Providers (APPs -  Physician Assistants and Nurse Practitioners) who all work together to provide you with the care you need, when you need it.  We recommend signing up for the patient portal called "MyChart".  Sign up information is provided on this After Visit Summary.  MyChart is used to connect with patients for Virtual Visits (Telemedicine).  Patients are able to view lab/test results, encounter notes, upcoming appointments, etc.  Non-urgent messages can be sent to your provider as well.   To learn more about what you can do with MyChart, go to [email or web address].    Your next appointment:   To be determined after testing.

## 2023-03-27 LAB — BASIC METABOLIC PANEL
BUN/Creatinine Ratio: 21 (ref 12–28)
BUN: 20 mg/dL (ref 8–27)
CO2: 27 mmol/L (ref 20–29)
Calcium: 10 mg/dL (ref 8.7–10.3)
Chloride: 97 mmol/L (ref 96–106)
Creatinine, Ser: 0.95 mg/dL (ref 0.57–1.00)
Glucose: 131 mg/dL — ABNORMAL HIGH (ref 70–99)
Potassium: 4.2 mmol/L (ref 3.5–5.2)
Sodium: 136 mmol/L (ref 134–144)
eGFR: 64 mL/min/{1.73_m2} (ref >59)

## 2023-03-27 LAB — CBC
Hematocrit: 36.2 % (ref 34.0–46.6)
Hemoglobin: 11.8 g/dL (ref 11.1–15.9)
MCH: 29.8 pg (ref 26.6–33.0)
MCHC: 32.6 g/dL (ref 31.5–35.7)
MCV: 91 fL (ref 79–97)
Platelets: 232 10*3/uL (ref 150–450)
RBC: 3.96 x10E6/uL (ref 3.77–5.28)
RDW: 12.8 % (ref 11.7–15.4)
WBC: 4.2 10*3/uL (ref 3.4–10.8)

## 2023-03-28 ENCOUNTER — Encounter (HOSPITAL_COMMUNITY): Payer: Self-pay | Admitting: Internal Medicine

## 2023-03-28 NOTE — Anesthesia Preprocedure Evaluation (Signed)
Anesthesia Evaluation  Patient identified by MRN, date of birth, ID band Patient awake    Reviewed: Allergy & Precautions, NPO status , Patient's Chart, lab work & pertinent test results, reviewed documented beta blocker date and time   Airway Mallampati: II  TM Distance: >3 FB     Dental  (+) Dental Advisory Given, Teeth Intact   Pulmonary asthma    Pulmonary exam normal breath sounds clear to auscultation       Cardiovascular hypertension, Pt. on medications and Pt. on home beta blockers Normal cardiovascular exam+ Valvular Problems/Murmurs MR  Rhythm:Regular Rate:Bradycardia  EKG 03/26/23 NSR, RBBB pattern  Echo 03/15/23 1. Left ventricular ejection fraction, by estimation, is 60 to 65%. The  left ventricle has normal function. The left ventricle has no regional  wall motion abnormalities. Left ventricular diastolic parameters are  consistent with Grade I diastolic  dysfunction (impaired relaxation).   2. Right ventricular systolic function is normal. The right ventricular  size is normal.   3. Left atrial size was mildly dilated.   4. The mitral valve is abnormal. Moderate to severe mitral valve  regurgitation. No evidence of mitral stenosis.   5. The aortic valve is tricuspid. Aortic valve regurgitation is mild. No  aortic stenosis is present.   6. Multiple jets of pulmonic regurgitation.   7. The inferior vena cava is normal in size with greater than 50%  respiratory variability, suggesting right atrial pressure of 3 mmHg.      Neuro/Psych  Headaches PSYCHIATRIC DISORDERS Anxiety Depression       GI/Hepatic negative GI ROS, Neg liver ROS,,,  Endo/Other  Hx/o right breast Ca 2001 S/P lumpectomy RT Osteoporosis   Renal/GU negative Renal ROS  negative genitourinary   Musculoskeletal negative musculoskeletal ROS (+)    Abdominal   Peds  Hematology negative hematology ROS (+)   Anesthesia Other  Findings   Reproductive/Obstetrics                              Anesthesia Physical Anesthesia Plan  ASA: 2  Anesthesia Plan: MAC   Post-op Pain Management: Minimal or no pain anticipated   Induction: Intravenous  PONV Risk Score and Plan: 2 and Treatment may vary due to age or medical condition and Propofol infusion  Airway Management Planned: Natural Airway and Nasal Cannula  Additional Equipment: None  Intra-op Plan:   Post-operative Plan:   Informed Consent: I have reviewed the patients History and Physical, chart, labs and discussed the procedure including the risks, benefits and alternatives for the proposed anesthesia with the patient or authorized representative who has indicated his/her understanding and acceptance.     Dental advisory given  Plan Discussed with: Anesthesiologist and CRNA  Anesthesia Plan Comments:          Anesthesia Quick Evaluation

## 2023-03-29 ENCOUNTER — Ambulatory Visit (HOSPITAL_BASED_OUTPATIENT_CLINIC_OR_DEPARTMENT_OTHER): Payer: Medicare Other | Admitting: Anesthesiology

## 2023-03-29 ENCOUNTER — Ambulatory Visit (HOSPITAL_COMMUNITY): Payer: Medicare Other | Admitting: Anesthesiology

## 2023-03-29 ENCOUNTER — Encounter (HOSPITAL_COMMUNITY): Payer: Self-pay | Admitting: Internal Medicine

## 2023-03-29 ENCOUNTER — Ambulatory Visit (HOSPITAL_COMMUNITY)
Admission: RE | Admit: 2023-03-29 | Discharge: 2023-03-29 | Disposition: A | Discharge status: Home / Self Care | Payer: Medicare Other | Attending: Internal Medicine | Admitting: Internal Medicine

## 2023-03-29 ENCOUNTER — Encounter (HOSPITAL_COMMUNITY)
Admission: RE | Disposition: A | Discharge status: Home / Self Care | Payer: Self-pay | Source: Home / Self Care | Attending: Internal Medicine

## 2023-03-29 ENCOUNTER — Ambulatory Visit (HOSPITAL_BASED_OUTPATIENT_CLINIC_OR_DEPARTMENT_OTHER)
Admission: RE | Admit: 2023-03-29 | Discharge: 2023-03-29 | Disposition: A | Discharge status: Home / Self Care | Payer: Medicare Other | Source: Ambulatory Visit | Attending: Cardiology | Admitting: Cardiology

## 2023-03-29 ENCOUNTER — Other Ambulatory Visit: Payer: Self-pay

## 2023-03-29 DIAGNOSIS — I340 Nonrheumatic mitral (valve) insufficiency: Secondary | ICD-10-CM

## 2023-03-29 DIAGNOSIS — R002 Palpitations: Secondary | ICD-10-CM | POA: Insufficient documentation

## 2023-03-29 DIAGNOSIS — Z79899 Other long term (current) drug therapy: Secondary | ICD-10-CM | POA: Insufficient documentation

## 2023-03-29 DIAGNOSIS — Z853 Personal history of malignant neoplasm of breast: Secondary | ICD-10-CM | POA: Diagnosis not present

## 2023-03-29 DIAGNOSIS — F17200 Nicotine dependence, unspecified, uncomplicated: Secondary | ICD-10-CM | POA: Diagnosis not present

## 2023-03-29 DIAGNOSIS — Z923 Personal history of irradiation: Secondary | ICD-10-CM | POA: Insufficient documentation

## 2023-03-29 DIAGNOSIS — Z01812 Encounter for preprocedural laboratory examination: Secondary | ICD-10-CM

## 2023-03-29 HISTORY — PX: TEE WITHOUT CARDIOVERSION: SHX5443

## 2023-03-29 LAB — ECHO TEE
MV M vel: 4.28 m/s
MV Peak grad: 73.3 mm[Hg]
Radius: 0.4 cm

## 2023-03-29 SURGERY — ECHOCARDIOGRAM, TRANSESOPHAGEAL
Anesthesia: General

## 2023-03-29 MED ORDER — FENTANYL CITRATE (PF) 100 MCG/2ML IJ SOLN
INTRAMUSCULAR | Status: DC | PRN
Start: 2023-03-29 — End: 2023-03-29
  Administered 2023-03-29: 50 ug via INTRAVENOUS

## 2023-03-29 MED ORDER — BUTAMBEN-TETRACAINE-BENZOCAINE 2-2-14 % EX AERO
INHALATION_SPRAY | CUTANEOUS | Status: DC | PRN
  Administered 2023-03-29: 1 via TOPICAL

## 2023-03-29 MED ORDER — SODIUM CHLORIDE 0.9 % IV SOLN
INTRAVENOUS | Status: DC | PRN
Start: 2023-03-29 — End: 2023-03-29

## 2023-03-29 MED ORDER — SODIUM CHLORIDE 0.9% FLUSH: 10.0000 mL | Freq: Two times a day (BID) | INTRAVENOUS | Status: DC

## 2023-03-29 MED ORDER — SUCCINYLCHOLINE CHLORIDE 200 MG/10ML IV SOSY
PREFILLED_SYRINGE | INTRAVENOUS | Status: DC | PRN
  Administered 2023-03-29: 120 mg via INTRAVENOUS

## 2023-03-29 MED ORDER — PROPOFOL 500 MG/50ML IV EMUL
INTRAVENOUS | Status: DC | PRN
Start: 2023-03-29 — End: 2023-03-29
  Administered 2023-03-29 (×3): 20 mg via INTRAVENOUS
  Administered 2023-03-29: 100 mg via INTRAVENOUS
  Administered 2023-03-29: 65 mg via INTRAVENOUS
  Administered 2023-03-29: 40 ug/kg/min via INTRAVENOUS

## 2023-03-29 MED ORDER — LIDOCAINE 2% (20 MG/ML) 5 ML SYRINGE
INTRAMUSCULAR | Status: DC | PRN
  Administered 2023-03-29: 50 mg via INTRAVENOUS

## 2023-03-29 MED ORDER — DEXMEDETOMIDINE HCL IN NACL 80 MCG/20ML IV SOLN
INTRAVENOUS | Status: DC | PRN
  Administered 2023-03-29: 12 ug via INTRAVENOUS

## 2023-03-29 NOTE — Discharge Instructions (Signed)
TEE   YOU HAD AN CARDIAC PROCEDURE TODAY: Refer to the procedure report and other information in the discharge instructions given to you for any specific questions about what was found during the examination. If this information does not answer your questions, please call Dr. Ganji's office at [phone] to clarify.   DIET: Your first meal following the procedure should be a light meal and then it is ok to progress to your normal diet. A half-sandwich or bowl of soup is an example of a good first meal. Heavy or fried foods are harder to digest and may make you feel nauseous or bloated. Drink plenty of fluids but you should avoid alcoholic beverages for 24 hours. If you had a esophageal dilation, please see attached instructions for diet.   ACTIVITY: Your care partner should take you home directly after the procedure. You should plan to take it easy, moving slowly for the rest of the day. You can resume normal activity the day after the procedure however YOU SHOULD NOT DRIVE, use power tools, machinery or perform tasks that involve climbing or major physical exertion for 24 hours (because of the sedation medicines used during the test).   SYMPTOMS TO REPORT IMMEDIATELY: A cardiologist can be reached at any hour. Please call [phone] for any of the following symptoms:  Vomiting of blood or coffee ground material  New, significant abdominal pain  New, significant chest pain or pain under the shoulder blades  Painful or persistently difficult swallowing  New shortness of breath  Black, tarry-looking or red, bloody stools  FOLLOW UP:  Please also call with any specific questions about appointments or follow up tests. 

## 2023-03-29 NOTE — CV Procedure (Signed)
TRANSESOPHAGEAL ECHOCARDIOGRAM (TEE) NOTE  INDICATIONS: Mitral regurgitation  PROCEDURE:   Informed consent was obtained prior to the procedure. The risks, benefits and alternatives for the procedure were discussed and the patient comprehended these risks.  Risks include, but are not limited to, cough, sore throat, vomiting, nausea, somnolence, esophageal and stomach trauma or perforation, bleeding, low blood pressure, aspiration, pneumonia, infection, trauma to the teeth and death.    After a procedural time-out, the patient was given propofol for sedation by anesthesia. See their separate report.  The patient's heart rate, blood pressure, and oxygen saturation are monitored continuously during the procedure.The oropharynx was anesthetized with topical cetacaine.  The transesophageal probe was inserted in the esophagus and stomach without difficulty and multiple views were obtained.  The patient was kept under observation until the patient left the procedure room.  I was present face-to-face 100% of this time. The patient left the procedure room in stable condition.   Agitated microbubble saline contrast was not administered.  COMPLICATIONS:    Patient experienced rapid sequential coughing with probable laryngospasm after the first attempt to pass the TEE probe. She became hypoxic and a decision was made with anesthesia for elective intubation for airway control. After this was performed, she was noted to successfully ventilate and the TEE probe was easily and successfully advanced to allow the procedure.  Findings:  LEFT VENTRICLE: The left ventricular wall thickness is normal.  The left ventricular cavity is normal in size. Wall motion is normal.  LVEF is 60-65%.  RIGHT VENTRICLE:  The right ventricle is normal in structure and function without any thrombus or masses.    LEFT ATRIUM:  The left atrium is mildly dilated in size without any thrombus or masses.  There is not spontaneous  echo contrast ("smoke") in the left atrium consistent with a low flow state.  LEFT ATRIAL APPENDAGE:  The left atrial appendage is free of any thrombus or masses. The appendage has single lobes. Pulse doppler indicates high flow in the appendage.  ATRIAL SEPTUM:  The atrial septum appears intact and is free of thrombus and/or masses.  There is no evidence for interatrial shunting by color doppler and saline microbubble.  RIGHT ATRIUM:  The right atrium is normal in size and function without any thrombus or masses.  MITRAL VALVE:  The mitral valve demonstrates mild late systolic bowing of the anterior leaflet with  mild to moderate  centrally directed regurgitation.  There were no vegetations or stenosis.  AORTIC VALVE:  The aortic valve is trileaflet, normal in structure and function with  trivial to mild central  regurgitation.  There were no vegetations or stenosis  TRICUSPID VALVE:  The tricuspid valve is normal in structure and function with  trivial  regurgitation.  There were no vegetations or stenosis. Lambl's excrescences were noted.   PULMONIC VALVE:  The pulmonic valve is normal in structure and function with  trivial  regurgitation.  There were no vegetations or stenosis.   AORTIC ARCH, ASCENDING AND DESCENDING AORTA:  There was no Myrtis Ser et. Al, 1992) atherosclerosis of the ascending aorta, aortic arch, or proximal descending aorta.  12. PULMONARY VEINS: Anomalous pulmonary venous return was not noted.  Systolic dominant mitral vein flow was noted (no flow reversal).  13. PERICARDIUM: The pericardium appeared normal and non-thickened.  There is no pericardial effusion.  IMPRESSION:   Mild late systolic bowing of the anterior leaflet mild to moderate, intermittent central mitral regurgitation. No flow reversal noted in the  pulmonary veins - ERO of 0.08 cm2, MR radius 0.4 cm. Trivial to mild central aortic insufficiency Mild LVH LVEF 60-65%  RECOMMENDATIONS:    Continued  monitoring of mitral regurgitation. No recommendation for surgical intervention at this time. Patient has reported worsening fatigue and exercise intolerance which will require further evaluation, but does not seem related to her mitral valve.  Time Spent Directly with the Patient:  60 minutes   Chrystie Nose, MD, [facility], FACP  [place]  [facility]  Medical Director of the [facility]  Cardiovascular Risk Reduction [facility] Diplomate of the American Board of Clinical Lipidology Attending Cardiologist  Direct Dial: [phone]  Fax: [phone]  Website:  [email or web address]  Lisette Abu Winni Ehrhard 03/29/2023, 2:13 PM

## 2023-03-29 NOTE — Interval H&P Note (Signed)
History and Physical Interval Note:  03/29/2023 1:25 PM  Mariah Romero  has presented today for surgery, with the diagnosis of severe mitral regurgitation.  The various methods of treatment have been discussed with the patient and family. After consideration of risks, benefits and other options for treatment, the patient has consented to  Procedure(s): TRANSESOPHAGEAL ECHOCARDIOGRAM (N/A) as a surgical intervention.  The patient's history has been reviewed, patient examined, no change in status, stable for surgery.  I have reviewed the patient's chart and labs.  Questions were answered to the patient's satisfaction.     Chrystie Nose

## 2023-03-29 NOTE — Progress Notes (Addendum)
  Echocardiogram Echocardiogram Transesophageal has been performed.  Mariah Romero 03/29/2023, 2:17 PM

## 2023-03-29 NOTE — Anesthesia Procedure Notes (Signed)
Procedure Name: Intubation Date/Time: 03/29/2023 2:01 PM  Performed by: Vena Austria, CRNAPre-anesthesia Checklist: Patient identified, Emergency Drugs available, Suction available, Patient being monitored and Timeout performed Patient Re-evaluated:Patient Re-evaluated prior to induction Oxygen Delivery Method: Circle system utilized Preoxygenation: Pre-oxygenation with 100% oxygen Induction Type: IV induction Ventilation: Mask ventilation without difficulty Tube size: 7.0 mm Number of attempts: 1 Airway Equipment and Method: Stylet Placement Confirmation: positive ETCO2, CO2 detector and breath sounds checked- equal and bilateral Secured at: 22 cm Tube secured with: Tape Dental Injury: Teeth and Oropharynx as per pre-operative assessment

## 2023-03-29 NOTE — Transfer of Care (Signed)
Immediate Anesthesia Transfer of Care Note  Patient: Mariah Romero  Procedure(s) Performed: TRANSESOPHAGEAL ECHOCARDIOGRAM  Patient Location: PACU and Cath Lab  Anesthesia Type:General  Level of Consciousness: awake  Airway & Oxygen Therapy: Patient Spontanous Breathing  Post-op Assessment: Report given to RN  Post vital signs: stable  Last Vitals:  Vitals Value Taken Time  BP    Temp    Pulse    Resp    SpO2      Last Pain:  Vitals:   03/29/23 1328  TempSrc:   PainSc: 0-No pain         Complications: No notable events documented.

## 2023-03-29 NOTE — Anesthesia Postprocedure Evaluation (Signed)
Anesthesia Post Note  Patient: Mariah Romero  Procedure(s) Performed: TRANSESOPHAGEAL ECHOCARDIOGRAM     Patient location during evaluation: PACU Anesthesia Type: General Level of consciousness: awake and alert and oriented Pain management: pain level controlled Vital Signs Assessment: post-procedure vital signs reviewed and stable Respiratory status: spontaneous breathing, nonlabored ventilation and respiratory function stable Cardiovascular status: blood pressure returned to baseline and stable Postop Assessment: no apparent nausea or vomiting Anesthetic complications: no   No notable events documented.  Last Vitals:  Vitals:   03/29/23 1213 03/29/23 1228  BP: (!) 103/90   Pulse: (!) 59 (!) 56  Resp: 11 16  Temp: (!) 36.4 C   SpO2: 99% 99%    Last Pain:  Vitals:   03/29/23 1447  TempSrc: Temporal  PainSc: 0-No pain                 [name] A.

## 2023-03-30 ENCOUNTER — Encounter (HOSPITAL_COMMUNITY): Payer: Self-pay | Admitting: Internal Medicine

## 2023-06-01 ENCOUNTER — Other Ambulatory Visit: Payer: Self-pay | Admitting: Nurse Practitioner

## 2023-06-01 MED ORDER — ATORVASTATIN CALCIUM 10 MG PO TABS: 10.0000 mg | ORAL_TABLET | Freq: Every day | ORAL | 3 refills | Status: AC

## 2023-06-01 NOTE — Telephone Encounter (Signed)
High Risk Warning Populated when attempting to refill, I will send to Provider for further review 

## 2023-06-04 ENCOUNTER — Encounter: Payer: Self-pay | Admitting: Nurse Practitioner

## 2023-06-21 DIAGNOSIS — M545 Low back pain, unspecified: Secondary | ICD-10-CM | POA: Diagnosis not present

## 2023-06-21 DIAGNOSIS — M5136 Other intervertebral disc degeneration, lumbar region with discogenic back pain only: Secondary | ICD-10-CM | POA: Diagnosis not present

## 2023-07-05 ENCOUNTER — Other Ambulatory Visit: Payer: Self-pay | Admitting: Gastroenterology

## 2023-07-05 ENCOUNTER — Other Ambulatory Visit: Payer: Medicare Other

## 2023-07-05 DIAGNOSIS — R1032 Left lower quadrant pain: Secondary | ICD-10-CM

## 2023-07-05 DIAGNOSIS — K5730 Diverticulosis of large intestine without perforation or abscess without bleeding: Secondary | ICD-10-CM | POA: Diagnosis not present

## 2023-07-05 DIAGNOSIS — Z860100 Personal history of colon polyps, unspecified: Secondary | ICD-10-CM | POA: Diagnosis not present

## 2023-07-06 ENCOUNTER — Ambulatory Visit
Admission: RE | Admit: 2023-07-06 | Discharge: 2023-07-06 | Disposition: A | Discharge status: Home / Self Care | Payer: Medicare Other | Source: Ambulatory Visit | Attending: Gastroenterology | Admitting: Gastroenterology

## 2023-07-06 DIAGNOSIS — R109 Unspecified abdominal pain: Secondary | ICD-10-CM | POA: Diagnosis not present

## 2023-07-06 DIAGNOSIS — K5732 Diverticulitis of large intestine without perforation or abscess without bleeding: Secondary | ICD-10-CM | POA: Diagnosis not present

## 2023-07-06 DIAGNOSIS — R1032 Left lower quadrant pain: Secondary | ICD-10-CM

## 2023-07-06 DIAGNOSIS — I700 Atherosclerosis of aorta: Secondary | ICD-10-CM | POA: Diagnosis not present

## 2023-07-06 MED ORDER — IOPAMIDOL (ISOVUE-300) INJECTION 61%
100.0000 mL | Freq: Once | INTRAVENOUS | Status: AC | PRN
  Administered 2023-07-06: 100 mL via INTRAVENOUS

## 2023-07-21 ENCOUNTER — Telehealth: Payer: Self-pay | Admitting: *Deleted

## 2023-07-21 DIAGNOSIS — M810 Age-related osteoporosis without current pathological fracture: Secondary | ICD-10-CM

## 2023-07-21 DIAGNOSIS — E785 Hyperlipidemia, unspecified: Secondary | ICD-10-CM

## 2023-07-21 NOTE — Telephone Encounter (Signed)
Patient has an appointment for Labs 07/27/2023, no lab orders are in Epic.    Please Place lab orders.

## 2023-07-22 NOTE — Telephone Encounter (Signed)
 Orders placed.

## 2023-07-27 ENCOUNTER — Other Ambulatory Visit: Payer: Medicare Other

## 2023-07-27 DIAGNOSIS — M810 Age-related osteoporosis without current pathological fracture: Secondary | ICD-10-CM | POA: Diagnosis not present

## 2023-07-27 DIAGNOSIS — E785 Hyperlipidemia, unspecified: Secondary | ICD-10-CM

## 2023-07-28 ENCOUNTER — Other Ambulatory Visit: Payer: Self-pay | Admitting: Orthopedic Surgery

## 2023-07-28 DIAGNOSIS — M545 Low back pain, unspecified: Secondary | ICD-10-CM

## 2023-07-28 LAB — COMPLETE METABOLIC PANEL WITH GFR
AG Ratio: 2.1 (calc) (ref 1.0–2.5)
ALT: 32 U/L — ABNORMAL HIGH (ref 6–29)
AST: 32 U/L (ref 10–35)
Albumin: 4.6 g/dL (ref 3.6–5.1)
Alkaline phosphatase (APISO): 43 U/L (ref 37–153)
BUN: 14 mg/dL (ref 7–25)
CO2: 31 mmol/L (ref 20–32)
Calcium: 10.7 mg/dL — ABNORMAL HIGH (ref 8.6–10.4)
Chloride: 99 mmol/L (ref 98–110)
Creat: 0.75 mg/dL (ref 0.60–1.00)
Globulin: 2.2 g/dL (ref 1.9–3.7)
Glucose, Bld: 85 mg/dL (ref 65–99)
Potassium: 4.2 mmol/L (ref 3.5–5.3)
Sodium: 136 mmol/L (ref 135–146)
Total Bilirubin: 0.7 mg/dL (ref 0.2–1.2)
Total Protein: 6.8 g/dL (ref 6.1–8.1)
eGFR: 85 mL/min/{1.73_m2} (ref >=60)

## 2023-07-28 LAB — CBC WITH DIFFERENTIAL/PLATELET
Absolute Lymphocytes: 1212 {cells}/uL (ref 850–3900)
Absolute Monocytes: 623 {cells}/uL (ref 200–950)
Basophils Absolute: 38 {cells}/uL (ref 0–200)
Basophils Relative: 1 %
Eosinophils Absolute: 118 {cells}/uL (ref 15–500)
Eosinophils Relative: 3.1 %
HCT: 38.4 % (ref 35.0–45.0)
Hemoglobin: 12.9 g/dL (ref 11.7–15.5)
MCH: 30.3 pg (ref 27.0–33.0)
MCHC: 33.6 g/dL (ref 32.0–36.0)
MCV: 90.1 fL (ref 80.0–100.0)
MPV: 9.7 fL (ref 7.5–12.5)
Monocytes Relative: 16.4 %
Neutro Abs: 1809 {cells}/uL (ref 1500–7800)
Neutrophils Relative %: 47.6 %
Platelets: 234 10*3/uL (ref 140–400)
RBC: 4.26 10*6/uL (ref 3.80–5.10)
RDW: 12.8 % (ref 11.0–15.0)
Total Lymphocyte: 31.9 %
WBC: 3.8 10*3/uL (ref 3.8–10.8)

## 2023-07-28 LAB — LIPID PANEL
Cholesterol: 228 mg/dL — ABNORMAL HIGH (ref <200)
HDL: 109 mg/dL (ref >=50)
LDL Cholesterol (Calc): 103 mg/dL — ABNORMAL HIGH
Non-HDL Cholesterol (Calc): 119 mg/dL (ref <130)
Total CHOL/HDL Ratio: 2.1 (calc) (ref <5.0)
Triglycerides: 74 mg/dL (ref <150)

## 2023-07-29 ENCOUNTER — Encounter: Payer: Self-pay | Admitting: Nurse Practitioner

## 2023-07-30 ENCOUNTER — Ambulatory Visit (INDEPENDENT_AMBULATORY_CARE_PROVIDER_SITE_OTHER): Payer: Medicare Other | Admitting: Nurse Practitioner

## 2023-07-30 ENCOUNTER — Encounter: Payer: Self-pay | Admitting: Nurse Practitioner

## 2023-07-30 VITALS — BP 118/74 | HR 64 | Temp 97.5°F | Ht 64.0 in | Wt 127.6 lb

## 2023-07-30 DIAGNOSIS — Z880 Allergy status to penicillin: Secondary | ICD-10-CM

## 2023-07-30 DIAGNOSIS — F5101 Primary insomnia: Secondary | ICD-10-CM

## 2023-07-30 DIAGNOSIS — I700 Atherosclerosis of aorta: Secondary | ICD-10-CM

## 2023-07-30 DIAGNOSIS — I340 Nonrheumatic mitral (valve) insufficiency: Secondary | ICD-10-CM | POA: Diagnosis not present

## 2023-07-30 DIAGNOSIS — K219 Gastro-esophageal reflux disease without esophagitis: Secondary | ICD-10-CM

## 2023-07-30 DIAGNOSIS — K5790 Diverticulosis of intestine, part unspecified, without perforation or abscess without bleeding: Secondary | ICD-10-CM | POA: Diagnosis not present

## 2023-07-30 DIAGNOSIS — E785 Hyperlipidemia, unspecified: Secondary | ICD-10-CM | POA: Diagnosis not present

## 2023-07-30 DIAGNOSIS — I10 Essential (primary) hypertension: Secondary | ICD-10-CM | POA: Diagnosis not present

## 2023-07-30 MED ORDER — OMEPRAZOLE 40 MG PO CPDR: 40.0000 mg | DELAYED_RELEASE_CAPSULE | Freq: Every day | ORAL | 1 refills | Status: DC

## 2023-07-30 NOTE — Patient Instructions (Signed)
 To start omeprazole 40 mg by mouth daily for 2 months  If it helps to continue medication

## 2023-07-30 NOTE — Progress Notes (Signed)
 Careteam: Patient Care Team: Sharon Seller, NP as PCP - General (Geriatric Medicine) Corky Crafts, MD as PCP - Cardiology (Cardiology) Levert Feinstein, MD as Consulting Physician (Neurology)  PLACE OF SERVICE:  [facility]  Advanced Directive information    Allergies  Allergen Reactions   Codeine     RINGING IN THE EARS.    Other Other (See Comments)    PAIN KILLERS   Oxycodone Itching   Sulfa Antibiotics Other (See Comments)   Ibuprofen Rash   Penicillins Swelling and Rash    Chief Complaint  Patient presents with   Medical Management of Chronic Issues    6 month follow-up. AWV pending for July 2025. Discussed need for additional covid boosters (declined). Discuss testing to see if allergic to penicillin. Follow-up from cardiology TEE procedure that has resulted in throat issues (were ongoing, procedure made it worse).      HPI: Patient is a 73 y.o. female who is here today for her 6 month follow up of medical management of chronic issues. She has a history of diverticulitis, history of breast cancer, depression, mitral valve insufficiency, HLD, insomnia, HTN, and right sided neck pain.   She wants to see an allergist to rule out actual allergy to penicillin if possible.   Her average weight is around 124 lbs, her weight today is 127.6 lbs.   She eats a lot of whole foods, uses butter in cooking. Avoids ultra processed foods.   Drinks 3 x 16 oz water each day.  Drinks 3 cups of coffee a day.   Drinks throat herbal tea for throat discomfort, she can feel the foods she swallows and her throat/neck is tender. She explains this is more of a lower right sided neck tenderness vs. Difficulty swallowing.   She had an echo TEE in October 2024 and had trouble with the intubation and anesthesia. She mentions this made her very weak x 3 days days post procedure.    She sees a GI provider for diverticulitis. She had a CT abdomen done on 07/06/23 which shows diverticulosis.    Mentions she has been eating less raw vegetables and salad due to diverticulitis flare up. This usually elevates her cholesterol levels when not able to eat her routine salads.    Review of Systems:  Review of Systems  Constitutional: Negative.   HENT: Negative.         Throat/lower right neck discomfort  Eyes: Negative.   Respiratory: Negative.    Cardiovascular: Negative.   Gastrointestinal:  Positive for abdominal pain and diarrhea.  Genitourinary: Negative.   Musculoskeletal:  Positive for neck pain.  Skin: Negative.   Neurological: Negative.   Endo/Heme/Allergies: Negative.   Psychiatric/Behavioral: Negative.      Past Medical History:  Diagnosis Date   Anxiety and depression    Breast cancer (HCC) 2001   RIGHT SIDE. S/P CHEMOTHERAPY   COVID 11/29/2020   Headache    Mitral valve regurgitation    Osteoporosis    Personal history of chemotherapy    Personal history of radiation therapy    Past Surgical History:  Procedure Laterality Date   APPENDECTOMY     AT AGE 89 YEARS OLD   BREAST LUMPECTOMY Right 2001   AT AGE 22 YEARS OLD   CHOLECYSTECTOMY     GALLBLADDER SURGERY  2012   TEE WITHOUT CARDIOVERSION N/A 03/29/2023   Procedure: TRANSESOPHAGEAL ECHOCARDIOGRAM;  Surgeon: Chrystie Nose, MD;  Location: [facility]VAS[facility] CV [facility];  Service: Cardiovascular;  Laterality: N/A;   Social History:   reports that she has never smoked. She has never used smokeless tobacco. She reports current alcohol use. She reports that she does not use drugs.  Family History  Problem Relation Age of Onset   Dementia Mother 34   Diabetes Mother    Hypertension Mother    Melanoma Father    Mental illness Father    CVA Father    Breast cancer Sister    Emphysema Paternal Grandmother        never smoker   Asthma Sister        had asthma as a child    Medications: Patient's Medications  New Prescriptions   No medications on file  Previous Medications   ASCORBIC ACID (VITAMIN C)  500 MG TABLET    Take 500 mg by mouth as needed. Off/on   ASPIRIN EC 81 MG TABLET    Take 1 tablet (81 mg total) by mouth daily. Swallow whole.   ATENOLOL (TENORMIN) 25 MG TABLET    Take 1 tablet (25 mg total) by mouth daily.   ATORVASTATIN (LIPITOR) 10 MG TABLET    Take 1 tablet (10 mg total) by mouth daily.   B COMPLEX-C (B-COMPLEX WITH VITAMIN C) TABLET    Take 1 tablet by mouth daily.   BIOTIN 10 MG CAPS    Take by mouth.   CHOLECALCIFEROL (VITAMIN D INFANT) 10 MCG/ML LIQD    Take 400 Units by mouth daily.   CITALOPRAM (CELEXA) 40 MG TABLET    TAKE 1 TABLET BY MOUTH DAILY   COENZYME Q10 300 MG CAPS    Take 1 capsule by mouth daily.   COLLAGENASE POWD    by Does not apply route. Vital Protein Collagen Peptides 2 scoops into coffee daily   CYANOCOBALAMIN (VITAMIN B12) 1000 MCG TABLET    Take 1,000 mcg by mouth daily.   DENTA 5000 PLUS 1.1 % CREA DENTAL CREAM    Take by mouth.   FOLIC ACID (FOLVITE) 400 MCG TABLET    Take 400 mcg by mouth daily.   IBUPROFEN (ADVIL) 800 MG TABLET    Take by mouth as needed.   IPRATROPIUM (ATROVENT) 0.03 % NASAL SPRAY    Place 2 sprays into both nostrils every 12 (twelve) hours.   MAGNESIUM BISGLYCINATE (MAG GLYCINATE PO)    Take 360 mg by mouth at bedtime.   METHOCARBAMOL (ROBAXIN) 500 MG TABLET    Take 1 tablet (500 mg total) by mouth daily as needed for muscle spasms.   MULTIPLE VITAMINS-MINERALS (EMERGEN-C IMMUNE) PACK    Take by mouth as needed.   MULTIPLE VITAMINS-MINERALS (ZINC PO)    Take 1 capsule by mouth daily.   OMEGA-3 FATTY ACIDS (OMEGA-3 PO)    Take 1 capsule by mouth daily.   ORAL ELECTROLYTES (LIQUID I.V. PO)    Take by mouth as needed (dehydration).   SACCHAROMYCES BOULARDII (FLORASTOR) 250 MG CAPSULE    Take 1 capsule (250 mg total) by mouth 2 (two) times daily.   SPECIALTY VITAMINS PRODUCTS (HAIR NOURISHING SUPPLEMENT PO)    Take by mouth. Nutrafol Brand   TRAZODONE (DESYREL) 50 MG TABLET    TAKE 1 TABLET BY MOUTH AT BEDTIME   WHEAT DEXTRIN  (BENEFIBER DRINK MIX PO)    Take 1 Package by mouth daily.  Modified Medications   No medications on file  Discontinued Medications   B COMPLEX VITAMINS CAPSULE    Take 1 capsule by mouth daily.  Physical Exam:  Vitals:   07/29/23 1645  BP: 118/74  Pulse: 64  Temp: (!) 97.5 F (36.4 C)  TempSrc: Temporal  SpO2: 99%  Weight: 127 lb 9.6 oz (57.9 kg)  Height: 5\' 4"  (1.626 m)   Body mass index is 21.9 kg/m. Wt Readings from Last 3 Encounters:  07/29/23 127 lb 9.6 oz (57.9 kg)  03/29/23 124 lb (56.2 kg)  03/26/23 128 lb (58.1 kg)    Physical Exam Vitals reviewed.  Constitutional:      Appearance: Normal appearance.  HENT:     Head: Normocephalic and atraumatic.     Nose: Nose normal.     Mouth/Throat:     Mouth: Mucous membranes are moist.     Pharynx: Oropharynx is clear.  Eyes:     Conjunctiva/sclera: Conjunctivae normal.  Neck:     Comments: Right lower neck discomfort  Cardiovascular:     Rate and Rhythm: Normal rate and regular rhythm.     Pulses: Normal pulses.     Heart sounds: Normal heart sounds.  Pulmonary:     Effort: Pulmonary effort is normal.     Breath sounds: Normal breath sounds.  Abdominal:     General: Bowel sounds are normal.     Palpations: Abdomen is soft.     Tenderness: There is abdominal tenderness.  Musculoskeletal:        General: Normal range of motion.     Cervical back: Neck supple.  Skin:    General: Skin is warm and dry.     Capillary Refill: Capillary refill takes less than 2 seconds.  Neurological:     Mental Status: She is alert and oriented to person, place, and time. Mental status is at baseline.  Psychiatric:        Mood and Affect: Mood normal.        Behavior: Behavior normal.     Labs reviewed: Basic Metabolic Panel: Recent Labs    12/24/22 1410 03/26/23 1620 07/27/23 1117  NA 134* 136 136  K 4.3 4.2 4.2  CL 97* 97 99  CO2 31 27 31   GLUCOSE 100* 131* 85  BUN 15 20 14   CREATININE 0.60 0.95 0.75   CALCIUM 10.6* 10.0 10.7*   Liver Function Tests: Recent Labs    12/24/22 1410 07/27/23 1117  AST 26 32  ALT 23 32*  BILITOT 0.8 0.7  PROT 6.7 6.8   No results for input(s): "LIPASE", "AMYLASE" in the last 8760 hours. No results for input(s): "AMMONIA" in the last 8760 hours. CBC: Recent Labs    08/21/22 1453 03/26/23 1620 07/27/23 1117  WBC 4.5 4.2 3.8  NEUTROABS 2,709  --  1,809  HGB 12.7 11.8 12.9  HCT 38.0 36.2 38.4  MCV 87.2 91 90.1  PLT 227 232 234   Lipid Panel: Recent Labs    12/24/22 1410 07/27/23 1117  CHOL 195 228*  HDL 107 109  LDLCALC 74 103*  TRIG 59 74  CHOLHDL 1.8 2.1   TSH: No results for input(s): "TSH" in the last 8760 hours. A1C: Lab Results  Component Value Date   HGBA1C 5.2 12/09/2021     Assessment/Plan  1. Penicillin allergy (Primary) - History of penicillin allergy from when she was 73 years old, unknown reaction - Patient would like to know if this is a true allergy and seeking allergy testing - Ambulatory referral to Allergy ordered  2. Gastroesophageal reflux disease without esophagitis - History of GERD, previously on omeprazole ? If  this is causing soreness in throat. Will restart omeprazole at this time.  She will take for at least 60 days to see if it improves symptoms - omeprazole (PRILOSEC) 40 MG capsule ordered; Take 1 capsule (40 mg total) by mouth daily.  Dispense: 30 capsule; Refill: 1 - Advised patient not to lay down after eating, remain upright for at least 2 hours after eating dinner.  - Continue avoiding fried and spicy foods, and avoid sauce/tomato sauces.  3. Hyperlipidemia LDL goal <100 - cholesterol slightly elevated from previous Cholesterol 228 from 2//25/25 and LDL 103.  - Continue atorvastatin 10 mg daily.  - Decrease butter in food, increase greens (being cautious of diverticulitis flare up)  4. Diverticulosis Stable at this time.  - Followed by GI provider - No reports of blood in stool. - No  nausea, no vomiting.  - Symptom management  5. Primary hypertension -Blood pressure well controlled, goal bp <140/90 Continue current medications and dietary modifications follow metabolic panel - Stable, BP 118/74 today - Continue atenolol 25 mg tablet daily  6. Mitral valve insufficiency, unspecified etiology - Had echo TEE done October 2024 - TEE result shows mild to moderate mitral valve regurgitation Continues to monitor.     8. Aortic atherosclerosis (HCC) - Found in prior imaging  - Continue atorvastatin 10 mg daily hyperlipidemia  -continues on ASA  9. Primary insomnia - Stable, continue trazodone 50 mg tablet at bedtime  Follow up in 4 months for routine visit with labs.   Lenord Fellers, RN DNP-AGPCNP Student -I personally was present during the history, physical exam and medical decision-making activities of this service and have verified that the service and findings are accurately documented in the student's note Deondria Puryear K. [facility] [phone]

## 2023-08-09 ENCOUNTER — Ambulatory Visit
Admission: RE | Admit: 2023-08-09 | Discharge: 2023-08-09 | Disposition: A | Discharge status: Home / Self Care | Payer: Medicare Other | Source: Ambulatory Visit | Attending: Orthopedic Surgery | Admitting: Orthopedic Surgery

## 2023-08-09 DIAGNOSIS — M545 Low back pain, unspecified: Secondary | ICD-10-CM

## 2023-08-09 DIAGNOSIS — M48061 Spinal stenosis, lumbar region without neurogenic claudication: Secondary | ICD-10-CM | POA: Diagnosis not present

## 2023-08-11 NOTE — Progress Notes (Unsigned)
 New Patient Note  RE: Mariah Romero MRN: [id number] DOB: 05-04-1951 Date of Office Visit: 08/12/2023  Consult requested by: Sharon Seller, NP Primary care provider: Sharon Seller, NP  Chief Complaint: Other (Allergic reaction at the age of 9- penicillin allergy? ) and Allergic Rhinitis  (Runny nose )  History of Present Illness: I had the pleasure of seeing Mariah Romero for initial evaluation at the [facility] on 08/12/2023. She is a 73 y.o. female, who is referred here by Sharon Seller, NP for the evaluation of penicillin allergy.  Discussed the use of AI scribe software for clinical note transcription with the patient, who gave verbal consent to proceed.    She recalls developing a rash at approximately 73 years old after taking an unknown type of penicillin for an unknown infection. The rash did not lead to hospitalization, and she did not experience severe symptoms such as skin sloughing or mouth ulcers. She is uncertain if penicillin would be used for her chronic diverticulitis, which requires frequent antibiotic treatment.  She has experienced reactions to other medications, including sulfa drugs, though she does not recall the specific reaction. Opioids cause itching, particularly behind her legs and in her hair, and excessive use of ibuprofen results in a rash and stomach upset, exacerbating her diverticulitis. Codeine causes her to feel 'ramped up' rather than sedated.  She has chronic diverticulitis, necessitating the use of antibiotics such as metronidazole and ciprofloxacin, which make her feel terrible. She has taken doxycycline and azithromycin in the past without significant issues.      Drug reaction: Reaction to penicillin which occurred at age 24.  Patient was being treated for unknown infection. Symptoms started after taking unknown amount of time. Symptoms include: possibly rash Symptoms persisted for unknown amount of time.   Treatment included: unsure. Mucosal involvement: no.  Previous history of rash/hives: none. Previous history of drug reactions: opioid cause pruritus. Sulfa - unsure of reactions in her 70s.  Tolerates small amounts of ibuprofen.  Tolerates flagyl, doxycycline, zpak with no issues.   Assessment and Plan: Mariah Romero is a 73 y.o. female with: Penicillin allergy Multiple drug allergies Reported rash after penicillin at age 62. Exact timeline, treatment and what infection she was being treated for is unknown. History of frequent diverticulitis.  Schedule for penicillin skin testing/drug challenge in the future. More than 90% of patients outgrow their penicillin allergy.   Drug challenge instructions: You must be off antihistamines for 3-5 days before. Must be in good health and not ill. No vaccines/injections/antibiotics within the past 7 days.  Plan on being in the office for 2-3 hours and must bring in the drug you want to do the oral challenge for - will send in prescription to pick up a few days before.  You must call to schedule an appointment and specify it's for a drug challenge.  May eat a light breakfast.   Return for Drug challenge.  No orders of the defined types were placed in this encounter.  Lab Orders  No laboratory test(s) ordered today    Other allergy screening: Asthma: no Rhino conjunctivitis: yes Constant rhinorrhea and takes ipratropium prn with some benefit.  Food allergy: no Hymenoptera allergy: no Urticaria: no Eczema:no History of recurrent infections suggestive of immunodeficency: no  Diagnostics: None.   Past Medical History: Patient Active Problem List   Diagnosis Date Noted   Current moderate episode of major depressive disorder, unspecified whether recurrent (HCC) 12/25/2022  Aortic atherosclerosis (HCC) 12/25/2022   Acute diverticulitis 03/18/2022   New onset of headaches 03/27/2021   History of breast cancer 03/27/2021   Bilateral  headaches 12/05/2020   Neck pain on right side 12/05/2020   Cough variant asthma vs uacs  10/07/2017   Mitral valve insufficiency 09/22/2016   Past Medical History:  Diagnosis Date   Anxiety and depression    Breast cancer (HCC) 2001   RIGHT SIDE. S/P CHEMOTHERAPY   COVID 11/29/2020   Headache    Mitral valve regurgitation    Osteoporosis    Personal history of chemotherapy    Personal history of radiation therapy    Past Surgical History: Past Surgical History:  Procedure Laterality Date   APPENDECTOMY     AT AGE 27 YEARS OLD   BREAST LUMPECTOMY Right 2001   AT AGE 26 YEARS OLD   CHOLECYSTECTOMY     GALLBLADDER SURGERY  2012   TEE WITHOUT CARDIOVERSION N/A 03/29/2023   Procedure: TRANSESOPHAGEAL ECHOCARDIOGRAM;  Surgeon: Chrystie Nose, MD;  Location: [facility];  Service: Cardiovascular;  Laterality: N/A;   Medication List:  Current Outpatient Medications  Medication Sig Dispense Refill   ascorbic acid (VITAMIN C) 500 MG tablet Take 500 mg by mouth as needed. Off/on     aspirin EC 81 MG tablet Take 1 tablet (81 mg total) by mouth daily. Swallow whole. 30 tablet 12   atenolol (TENORMIN) 25 MG tablet Take 1 tablet (25 mg total) by mouth daily. 90 tablet 3   atorvastatin (LIPITOR) 10 MG tablet Take 1 tablet (10 mg total) by mouth daily. 90 tablet 3   B Complex-C (B-COMPLEX WITH VITAMIN C) tablet Take 1 tablet by mouth daily.     Biotin 10 MG CAPS Take by mouth.     cholecalciferol (VITAMIN D INFANT) 10 MCG/ML LIQD Take 400 Units by mouth daily.     citalopram (CELEXA) 40 MG tablet TAKE 1 TABLET BY MOUTH DAILY 90 tablet 1   Coenzyme Q10 300 MG CAPS Take 1 capsule by mouth daily.     Collagenase POWD by Does not apply route. Vital Protein Collagen Peptides 2 scoops into coffee daily     cyanocobalamin (VITAMIN B12) 1000 MCG tablet Take 1,000 mcg by mouth daily.     DENTA 5000 PLUS 1.1 % CREA dental cream Take by mouth.     folic acid (FOLVITE) 400 MCG tablet Take  400 mcg by mouth daily.     ibuprofen (ADVIL) 800 MG tablet Take by mouth as needed.     ipratropium (ATROVENT) 0.03 % nasal spray Place 2 sprays into both nostrils every 12 (twelve) hours. 30 mL 12   Magnesium Bisglycinate (MAG GLYCINATE PO) Take 360 mg by mouth at bedtime.     methocarbamol (ROBAXIN) 500 MG tablet Take 1 tablet (500 mg total) by mouth daily as needed for muscle spasms. 30 tablet 0   Multiple Vitamins-Minerals (EMERGEN-C IMMUNE) PACK Take by mouth as needed.     Multiple Vitamins-Minerals (ZINC PO) Take 1 capsule by mouth daily.     Omega-3 Fatty Acids (OMEGA-3 PO) Take 1 capsule by mouth daily.     omeprazole (PRILOSEC) 40 MG capsule Take 1 capsule (40 mg total) by mouth daily. 30 capsule 1   Oral Electrolytes (LIQUID I.V. PO) Take by mouth as needed (dehydration).     saccharomyces boulardii (FLORASTOR) 250 MG capsule Take 1 capsule (250 mg total) by mouth 2 (two) times daily. 180 capsule 3  Specialty Vitamins Products (HAIR NOURISHING SUPPLEMENT PO) Take by mouth. Nutrafol Brand     traZODone (DESYREL) 50 MG tablet TAKE 1 TABLET BY MOUTH AT BEDTIME 90 tablet 1   Wheat Dextrin (BENEFIBER DRINK MIX PO) Take 1 Package by mouth daily.     No current facility-administered medications for this visit.   Allergies: Allergies  Allergen Reactions   Codeine     RINGING IN THE EARS.    Other Other (See Comments)    PAIN KILLERS   Oxycodone Itching   Sulfa Antibiotics Other (See Comments)   Ibuprofen Rash   Penicillins Swelling and Rash   Social History: Social History   Socioeconomic History   Marital status: Widowed    Spouse name: Not on file   Number of children: 2   Years of education: COLLEGE   Highest education level: Not on file  Occupational History   Occupation: RETIRED  Tobacco Use   Smoking status: Never   Smokeless tobacco: Never  Vaping Use   Vaping status: Never Used  Substance and Sexual Activity   Alcohol use: Yes    Comment: 14 drinks a week.    Drug use: No   Sexual activity: Not on file  Other Topics Concern   Not on file  Social History Narrative   Tobacco use, amount per day now: Never   Past tobacco use, amount per day: Never   How many years did you use tobacco:   Alcohol use (drinks per week): 14   Diet: Low fat   Do you drink/eat things with caffeine: yes   Marital status:   Widowed                               What year were you married? 1981   Do you live in a house, apartment, assisted living, condo, trailer, etc.?    Is it one or more stories? 2   How many persons live in your home? 2-3   Do you have pets in your home?( please list) Sometimes a family dog.   Highest Level of education completed? College.   Current or past profession: Runner, broadcasting/film/video, Customer service manager.   Do you exercise? Little                                 Type and how often? Walk & Stair Climbing.   Do you have a living will? No   Do you have a DNR form?    No                               If not, do you want to discuss one?   Do you have signed POA/HPOA forms? No                        If so, please bring to you appointment      Do you have any difficulty bathing or dressing yourself? No   Do you have any difficulty preparing food or eating? No   Do you have any difficulty managing your medications? No   Do you have any difficulty managing your finances? No   Do you have any difficulty affording your medications? No   Social Drivers of Corporate investment banker Strain: Not on file  Food Insecurity: Low Risk  (10/06/2022)   [facility]   Hunger Vital Sign    Worried About Running Out of Food in the Last Year: Never true    Ran Out of Food in the Last Year: Never true  Transportation Needs: No Transportation Needs (10/06/2022)   [facility]   Transportation    In the past 12 months, has lack of reliable transportation kept you from medical appointments, meetings, work or from  getting things needed for daily living? : No  Physical Activity: Not on file  Stress: Not on file  Social Connections: Not on file   Lives in a house. Smoking: denies Occupation: retired  Landscape architect HistorySurveyor, minerals in the house: no Engineer, civil (consulting) in the family room: yes Carpet in the bedroom: yes Heating: electric Cooling: central Pet: yes sometimes grand dogs come over.  Family History: Family History  Problem Relation Age of Onset   Dementia Mother 60   Diabetes Mother    Hypertension Mother    Melanoma Father    Mental illness Father    CVA Father    Breast cancer Sister    Emphysema Paternal Grandmother        never smoker   Asthma Sister        had asthma as a child   Problem                               Relation Drug allergies   No  Review of Systems  Constitutional:  Negative for appetite change, chills, fever and unexpected weight change.  HENT:  Negative for congestion and rhinorrhea.   Eyes:  Negative for itching.  Respiratory:  Negative for cough, chest tightness, shortness of breath and wheezing.   Cardiovascular:  Negative for chest pain.  Gastrointestinal:  Positive for diarrhea. Negative for abdominal pain.  Genitourinary:  Negative for difficulty urinating.  Skin:  Negative for rash.  Neurological:  Negative for headaches.    Objective: BP 110/64   Pulse 63   Temp 98 F (36.7 C)   Resp 16   Ht 5\' 4"  (1.626 m) Comment: per pt.  Wt 130 lb 8 oz (59.2 kg)   SpO2 98%   BMI 22.40 kg/m  Body mass index is 22.4 kg/m. Physical Exam Vitals and nursing note reviewed.  Constitutional:      Appearance: Normal appearance. She is well-developed.  HENT:     Head: Normocephalic and atraumatic.     Right Ear: Tympanic membrane and external ear normal.     Left Ear: Tympanic membrane and external ear normal.     Nose: Nose normal.     Mouth/Throat:     Mouth: Mucous membranes are moist.     Pharynx: Oropharynx is clear.  Eyes:      Conjunctiva/sclera: Conjunctivae normal.  Cardiovascular:     Rate and Rhythm: Normal rate and regular rhythm.     Heart sounds: Murmur heard.     No friction rub. No gallop.  Pulmonary:     Effort: Pulmonary effort is normal.     Breath sounds: Normal breath sounds. No wheezing, rhonchi or rales.  Musculoskeletal:     Cervical back: Neck supple.  Skin:    General: Skin is warm.     Findings: No rash.  Neurological:     Mental Status: She is alert and oriented to person, place, and  time.  Psychiatric:        Behavior: Behavior normal.   The plan was reviewed with the patient/family, and all questions/concerned were addressed.  It was my pleasure to see Mariah Romero today and participate in her care. Please feel free to contact me with any questions or concerns.  Sincerely,  Wyline Mood, DO Allergy & Immunology  [facility] office: [phone] [facility] office: [phone]

## 2023-08-12 ENCOUNTER — Ambulatory Visit: Admitting: Allergy

## 2023-08-12 ENCOUNTER — Encounter: Payer: Self-pay | Admitting: Allergy

## 2023-08-12 VITALS — BP 110/64 | HR 63 | Temp 98.0°F | Resp 16 | Ht 64.0 in | Wt 130.5 lb

## 2023-08-12 DIAGNOSIS — Z889 Allergy status to unspecified drugs, medicaments and biological substances status: Secondary | ICD-10-CM

## 2023-08-12 DIAGNOSIS — Z880 Allergy status to penicillin: Secondary | ICD-10-CM

## 2023-08-12 DIAGNOSIS — Z888 Allergy status to other drugs, medicaments and biological substances status: Secondary | ICD-10-CM | POA: Diagnosis not present

## 2023-08-12 NOTE — Patient Instructions (Addendum)
 Penicillin allergy Consider penicillin skin testing/drug challenge in the future. More than 90% of patients outgrow their penicillin allergy.   Drug challenge instructions: You must be off antihistamines for 3-5 days before. Must be in good health and not ill. No vaccines/injections/antibiotics within the past 7 days.  Plan on being in the office for 2-3 hours and must bring in the drug you want to do the oral challenge for - will send in prescription to pick up a few days before.  You must call to schedule an appointment and specify it's for a drug challenge.  May eat a light breakfast.   Follow up for penicillin skin testing and drug challenge.

## 2023-08-24 DIAGNOSIS — Z09 Encounter for follow-up examination after completed treatment for conditions other than malignant neoplasm: Secondary | ICD-10-CM | POA: Diagnosis not present

## 2023-08-24 DIAGNOSIS — K5730 Diverticulosis of large intestine without perforation or abscess without bleeding: Secondary | ICD-10-CM | POA: Diagnosis not present

## 2023-08-24 DIAGNOSIS — D123 Benign neoplasm of transverse colon: Secondary | ICD-10-CM | POA: Diagnosis not present

## 2023-08-24 DIAGNOSIS — Z860101 Personal history of adenomatous and serrated colon polyps: Secondary | ICD-10-CM | POA: Diagnosis not present

## 2023-08-24 LAB — HM COLONOSCOPY

## 2023-08-26 ENCOUNTER — Other Ambulatory Visit: Payer: Self-pay | Admitting: Nurse Practitioner

## 2023-08-26 ENCOUNTER — Other Ambulatory Visit: Payer: Self-pay | Admitting: Allergy

## 2023-08-26 DIAGNOSIS — F330 Major depressive disorder, recurrent, mild: Secondary | ICD-10-CM

## 2023-08-26 DIAGNOSIS — D123 Benign neoplasm of transverse colon: Secondary | ICD-10-CM | POA: Diagnosis not present

## 2023-08-26 MED ORDER — AMOXICILLIN 250 MG/5ML PO SUSR: ORAL | 0 refills | Status: DC

## 2023-08-26 NOTE — Telephone Encounter (Signed)
 Pharmacy Requested refill.  Pended and sent to [facility] for approval due to HIGH ALERT Warning.

## 2023-08-26 NOTE — Telephone Encounter (Signed)
 High risk warning popped up unable to refill.

## 2023-08-30 NOTE — Progress Notes (Unsigned)
 Follow Up Note  RE: Mariah Romero MRN: [id number] DOB: 1950/06/27 Date of Office Visit: 08/31/2023  Referring provider: Sharon Seller, NP Primary care provider: Sharon Seller, NP  Chief Complaint: drug challenge  Assessment and Plan: Marty is a 73 y.o. female with: Penicillin allergy Past history - Reported rash after penicillin at age 37. Exact timeline, treatment and what infection she was being treated for is unknown. History of frequent diverticulitis.  Negative skin testing to penicillin and its components today. Tolerated 10mL of amoxicillin 250mg /42mL today in 2 doses (1mL then 9mL) with no issues.  For next 24 hours monitor for hives, swelling, shortness of breath and dizziness. If you see these symptoms, use Benadryl for mild symptoms and for more severe symptoms go to urgent care/ER or call 911.  Your risk of developing penicillin allergy in the future is the same as the general population's.  May take penicillin type of antibiotics in the future if needed.  Removed penicillin from allergy list.   Return if symptoms worsen or fail to improve.  Plan: Challenge drug: amoxicillin Challenge as per protocol: Passed Total time: 82 min  History of Present Illness: I had the pleasure of seeing Mariah Romero for a follow up visit at the [facility] on 08/31/2023. She is a 73 y.o. female, who is being followed for drug allergies. Her previous allergy office visit was on 08/12/2023 with Dr. Selena Batten. Today she is here for penicillin drug testing and challenge.   Discussed the use of AI scribe software for clinical note transcription with the patient, who gave verbal consent to proceed.    She is undergoing allergy testing due to a history of a rash after taking penicillin at age 79. The specifics of the reaction are unclear due to the time elapsed. She requires frequent antibiotic treatment for diverticulitis, and her options are limited due to the  penicillin allergy.  Her recent colonoscopy revealed extensive diverticulosis and resulted in the removal of two polyps. No ongoing diarrhea is reported.  She has a chronic runny nose persisting for decades, but no recent rashes, itching, or swelling. She did not use her nasal spray on the day of the visit to avoid interference with the testing and has not used it in the last three days. She does not take any other allergy medications.  She has a history of mitral valve regurgitation, which has progressed from mild to mild to moderate. No known lung or cardiac diseases beyond this are reported.  Her current medications include atenolol, taken one tablet nightly.      Interval History: Patient has not been ill, she has not had any accidental exposures to the culprit medication.   Recent/Current History: Pulmonary disease: no Cardiac disease:  mitral valve regurgitation Respiratory infection: no Rash: no Itch: no Swelling: no Cough: no Shortness of breath: no Runny/stuffy nose: yes Rhinorrhea at baseline.  Itchy eyes: no Beta-blocker use: yes Atenolol 25mg  taken last night  Patient/guardian was informed of the test procedure with verbalized understanding of the risk of anaphylaxis. Consent was signed.   Last antihistamine use: none in the past 3 days Last beta-blocker use: yesterday  Medication List:  Current Outpatient Medications  Medication Sig Dispense Refill   ascorbic acid (VITAMIN C) 500 MG tablet Take 500 mg by mouth as needed. Off/on     aspirin EC 81 MG tablet Take 1 tablet (81 mg total) by mouth daily. Swallow whole. 30 tablet 12  atenolol (TENORMIN) 25 MG tablet Take 1 tablet (25 mg total) by mouth daily. 90 tablet 3   atorvastatin (LIPITOR) 10 MG tablet Take 1 tablet (10 mg total) by mouth daily. 90 tablet 3   B Complex-C (B-COMPLEX WITH VITAMIN C) tablet Take 1 tablet by mouth daily.     Biotin 10 MG CAPS Take by mouth.     cholecalciferol (VITAMIN D INFANT) 10  MCG/ML LIQD Take 400 Units by mouth daily.     citalopram (CELEXA) 40 MG tablet TAKE 1 TABLET BY MOUTH DAILY 90 tablet 1   Coenzyme Q10 300 MG CAPS Take 1 capsule by mouth daily.     Collagenase POWD by Does not apply route. Vital Protein Collagen Peptides 2 scoops into coffee daily     cyanocobalamin (VITAMIN B12) 1000 MCG tablet Take 1,000 mcg by mouth daily.     DENTA 5000 PLUS 1.1 % CREA dental cream Take by mouth.     folic acid (FOLVITE) 400 MCG tablet Take 400 mcg by mouth daily.     ibuprofen (ADVIL) 800 MG tablet Take by mouth as needed.     ipratropium (ATROVENT) 0.03 % nasal spray Place 2 sprays into both nostrils every 12 (twelve) hours. 30 mL 12   Magnesium Bisglycinate (MAG GLYCINATE PO) Take 360 mg by mouth at bedtime.     methocarbamol (ROBAXIN) 500 MG tablet Take 1 tablet (500 mg total) by mouth daily as needed for muscle spasms. 30 tablet 0   Multiple Vitamins-Minerals (EMERGEN-C IMMUNE) PACK Take by mouth as needed.     Multiple Vitamins-Minerals (ZINC PO) Take 1 capsule by mouth daily.     Omega-3 Fatty Acids (OMEGA-3 PO) Take 1 capsule by mouth daily.     omeprazole (PRILOSEC) 40 MG capsule Take 1 capsule (40 mg total) by mouth daily. 30 capsule 1   Oral Electrolytes (LIQUID I.V. PO) Take by mouth as needed (dehydration).     saccharomyces boulardii (FLORASTOR) 250 MG capsule Take 1 capsule (250 mg total) by mouth 2 (two) times daily. 180 capsule 3   Specialty Vitamins Products (HAIR NOURISHING SUPPLEMENT PO) Take by mouth. Nutrafol Brand     traZODone (DESYREL) 50 MG tablet TAKE 1 TABLET BY MOUTH AT BEDTIME 90 tablet 1   Wheat Dextrin (BENEFIBER DRINK MIX PO) Take 1 Package by mouth daily.     No current facility-administered medications for this visit.   Allergies: Allergies  Allergen Reactions   Codeine     RINGING IN THE EARS.    Other Other (See Comments)    PAIN KILLERS   Oxycodone Itching   Sulfa Antibiotics Other (See Comments)   Ibuprofen Rash   I  reviewed her past medical history, social history, family history, and environmental history and no significant changes have been reported from her previous visit.  Review of Systems  Constitutional:  Negative for appetite change, chills, fever and unexpected weight change.  HENT:  Positive for rhinorrhea. Negative for congestion.   Eyes:  Negative for itching.  Respiratory:  Negative for cough, chest tightness, shortness of breath and wheezing.   Cardiovascular:  Negative for chest pain.  Gastrointestinal:  Negative for abdominal pain and diarrhea.  Genitourinary:  Negative for difficulty urinating.  Skin:  Negative for rash.  Neurological:  Negative for headaches.   Objective: BP 122/62   Pulse 66   Temp 97.8 F (36.6 C) (Temporal)   Resp 14   Ht 5' 3.19" (1.605 m)   Wt 127 lb  12.8 oz (58 kg)   SpO2 98%   BMI 22.50 kg/m  Body mass index is 22.5 kg/m. Physical Exam Vitals and nursing note reviewed.  Constitutional:      Appearance: Normal appearance. She is well-developed.  HENT:     Head: Normocephalic and atraumatic.     Right Ear: Tympanic membrane and external ear normal.     Left Ear: Tympanic membrane and external ear normal.     Nose: Nose normal.     Mouth/Throat:     Mouth: Mucous membranes are moist.     Pharynx: Oropharynx is clear.  Eyes:     Conjunctiva/sclera: Conjunctivae normal.  Cardiovascular:     Rate and Rhythm: Normal rate and regular rhythm.     Heart sounds: Murmur heard.     No friction rub. No gallop.  Pulmonary:     Effort: Pulmonary effort is normal.     Breath sounds: Normal breath sounds. No wheezing, rhonchi or rales.  Musculoskeletal:     Cervical back: Neck supple.  Skin:    General: Skin is warm.     Findings: No rash.  Neurological:     Mental Status: She is alert and oriented to person, place, and time.  Psychiatric:        Behavior: Behavior normal.     Diagnostics: Negative skin testing to penicillin and its components  today. Tolerated 10mL of amoxicillin 250mg /43mL today.  Results discussed with patient/family.  Oral Challenge - 08/31/23 1100     Challenge Food/Drug Amoxicillin    Food/Drug provided by Patient    BP 120/64    Pulse 53    Respirations 18    Lungs 99%    Skin clear    Mouth clear    Time 1000    Dose 1mL    Lungs clear    Skin clear    Mouth clear    Time 1018    Dose 9mL    Lungs clear    Skin clear    Mouth clear    Time 1122    Dose 1 Hour Observation/Final Vitals    BP 122/62    Pulse 66    Respirations 14    Lungs 98%    Skin clear    Mouth clear             Penicillin - 08/31/23 1200     Manufacturer [employer]    Lot # [id number]    Location Arm    Time Testing Placed 0903    Control SPT Negative    Histamine SPT 3+    Pre-Pen Puncture Negative    Penicillin-G 5000 u/ml SPT Negative    Time Testing Placed [phone]    Control Intradermal Negative    Pre-Pen Intradermal Negative    Penicillin-G 50 u/ml Intradermal Negative    Time Testing Placed 0941    Penicillin-G 5000 u/ml Intradermal Negative             Previous notes and tests were reviewed. The plan was reviewed with the patient/family, and all questions/concerned were addressed.  It was my pleasure to see Eileene today and participate in her care. Please feel free to contact me with any questions or concerns.  Sincerely,  Wyline Mood, DO Allergy & Immunology  [facility] office: [phone] [facility] office: [phone]

## 2023-08-31 ENCOUNTER — Encounter: Payer: Self-pay | Admitting: Allergy

## 2023-08-31 ENCOUNTER — Ambulatory Visit: Admitting: Allergy

## 2023-08-31 VITALS — BP 122/62 | HR 66 | Temp 97.8°F | Resp 14 | Ht 63.19 in | Wt 127.8 lb

## 2023-08-31 DIAGNOSIS — Z880 Allergy status to penicillin: Secondary | ICD-10-CM | POA: Diagnosis not present

## 2023-08-31 DIAGNOSIS — T50995D Adverse effect of other drugs, medicaments and biological substances, subsequent encounter: Secondary | ICD-10-CM

## 2023-08-31 NOTE — Patient Instructions (Signed)
 Negative skin testing to penicillin and its components today. Tolerated 10mL of amoxicillin 250mg /80mL today.  For next 24 hours monitor for hives, swelling, shortness of breath and dizziness. If you see these symptoms, use Benadryl for mild symptoms and for more severe symptoms go to urgent care/ER or call 911.  Your risk of developing penicillin allergy in the future is the same as the general population's.  May take penicillin type of antibiotics in the future if needed.

## 2023-09-06 ENCOUNTER — Telehealth: Payer: Self-pay | Admitting: *Deleted

## 2023-09-06 NOTE — Telephone Encounter (Signed)
 Copied from CRM [id number]. Topic: Clinical - Medication Question >> Sep 06, 2023  9:51 AM Corin V wrote: Reason for CRM: Patient stated she has diverticulitis again and she is asking that amoxicillin can be called in. She said that since she has a history of this she would like something to just be called in.    Forwarded message to [facility] Please Advise.

## 2023-09-07 ENCOUNTER — Telehealth: Payer: Self-pay

## 2023-09-07 NOTE — Telephone Encounter (Signed)
 Would recommend follow up to discuss

## 2023-09-07 NOTE — Telephone Encounter (Signed)
 Copied from CRM [id number]. Topic: Clinical - Medical Advice >> Sep 07, 2023 10:02 AM Everette Rank wrote: Reason for CRM: 2nd Attempt  Note: Patient stated she has diverticulitis again and she is asking that amoxicillin can be called in. She said that since she has a history of this she would like something to just be called in. Needs update  Also on penicillin allergy she took allergy test she is not allergic to penicillin to update her chart  Needs call back [phone]  Sharon Seller, NP  [id number] minutes ago (9:36 AM)    Would recommend follow up to discuss      Note  Patient has an appointment to see Ngetich, Donalee Citrin, NP on tomorrow at 2:40 pm in the office. FYI   Message sent to Sharon Seller, NP

## 2023-09-07 NOTE — Telephone Encounter (Signed)
 Noted she has appt to be evaluated.

## 2023-09-07 NOTE — Telephone Encounter (Signed)
 Patient has an appointment tomorrow with Ngetich, Donalee Citrin, NP. Sharon Seller, NP has been notify

## 2023-09-08 ENCOUNTER — Encounter: Payer: Self-pay | Admitting: Family

## 2023-09-08 ENCOUNTER — Ambulatory Visit (INDEPENDENT_AMBULATORY_CARE_PROVIDER_SITE_OTHER): Admitting: Family

## 2023-09-08 VITALS — BP 120/66 | HR 69 | Temp 97.6°F | Resp 19 | Ht 63.0 in | Wt 127.4 lb

## 2023-09-08 DIAGNOSIS — K5792 Diverticulitis of intestine, part unspecified, without perforation or abscess without bleeding: Secondary | ICD-10-CM | POA: Diagnosis not present

## 2023-09-08 DIAGNOSIS — Z860100 Personal history of colon polyps, unspecified: Secondary | ICD-10-CM

## 2023-09-08 MED ORDER — AMOXICILLIN-POT CLAVULANATE 875-125 MG PO TABS: 1.0000 | ORAL_TABLET | Freq: Two times a day (BID) | ORAL | 0 refills | Status: AC

## 2023-09-08 MED ORDER — DIAZEPAM 5 MG PO TABS: 5.0000 mg | ORAL_TABLET | ORAL | 0 refills | Status: AC | PRN

## 2023-09-12 NOTE — Progress Notes (Signed)
 Provider: Richel Millspaugh FNP-C  Verma Gobble, NP  Patient Care Team: Verma Gobble, NP as PCP - General (Geriatric Medicine) Lucendia Rusk, MD as PCP - Cardiology (Cardiology) Phebe Brasil, MD as Consulting Physician (Neurology)  Extended Emergency Contact Information Primary Emergency Contact: Jordan,Arthur  [place] Home Phone: [phone] Relation: Significant other Secondary Emergency Contact: Kiernan,Clark Mobile Phone: [phone] Relation: Son  Code Status: Full code Goals of care: Advanced Directive information    09/08/2023    2:59 PM  Advanced Directives  Does Patient Have a Medical Advance Directive? No  Would patient like information on creating a medical advance directive? No - Patient declined     Chief Complaint  Patient presents with   Acute Visit    Divertuculitis needs antibotitics    Discussed the use of AI scribe software for clinical note transcription with the patient, who gave verbal consent to proceed.  History of Present Illness   Mariah Romero is a 73 year old female with chronic diverticulitis who presents with abdominal pain and recent fever.  She has chronic diverticulitis and presents with abdominal pain located on the left lower side, consistent with previous episodes. On Sunday, she experienced a fever with a temperature reaching 99.29F, accompanied by severe pain. Although the fever has resolved, the pain persists. No bloating, nausea, or vomiting.  She experiences diarrhea without any visible blood. Her appetite remains good, although she consumes smaller meals than usual. She notes a slight weight loss, which she attributes to a recent colonoscopy.  A colonoscopy was performed on August 24, 2023, at [facility] in [place], revealing two polyps in the ascending and transverse colon, which were removed, and multiple medium to large diverticula in the sigmoid, descending, transverse, and ascending  colon.  She has a history of being told she was allergic to penicillin as a child, but recent testing confirmed she is not allergic. She previously experienced adverse effects from Cipro and metronidazole. She currently takes probiotics, specifically Florastor, twice daily, which has improved her stool consistency. She also takes omeprazole before meals.     Past Medical History:  Diagnosis Date   Anxiety and depression    Breast cancer (HCC) 2001   RIGHT SIDE. S/P CHEMOTHERAPY   COVID 11/29/2020   Headache    Mitral valve regurgitation    Osteoporosis    Personal history of chemotherapy    Personal history of radiation therapy    Past Surgical History:  Procedure Laterality Date   APPENDECTOMY     AT AGE 66 YEARS OLD   BREAST LUMPECTOMY Right 2001   AT AGE 88 YEARS OLD   CHOLECYSTECTOMY     GALLBLADDER SURGERY  2012   TEE WITHOUT CARDIOVERSION N/A 03/29/2023   Procedure: TRANSESOPHAGEAL ECHOCARDIOGRAM;  Surgeon: Hazle Lites, MD;  Location: [facility];  Service: Cardiovascular;  Laterality: N/A;    Allergies  Allergen Reactions   Codeine     RINGING IN THE EARS.    Other Other (See Comments)    PAIN KILLERS   Oxycodone Itching   Sulfa Antibiotics Other (See Comments)   Ibuprofen Rash    Outpatient Encounter Medications as of 09/08/2023  Medication Sig   amoxicillin-clavulanate (AUGMENTIN) 875-125 MG tablet Take 1 tablet by mouth 2 (two) times daily for 7 days.   ascorbic acid (VITAMIN C) 500 MG tablet Take 500 mg by mouth as needed. Off/on   aspirin EC 81 MG tablet Take 1 tablet (81 mg  total) by mouth daily. Swallow whole.   atenolol (TENORMIN) 25 MG tablet Take 1 tablet (25 mg total) by mouth daily.   atorvastatin (LIPITOR) 10 MG tablet Take 1 tablet (10 mg total) by mouth daily.   B Complex-C (B-COMPLEX WITH VITAMIN C) tablet Take 1 tablet by mouth daily.   Biotin 10 MG CAPS Take by mouth.   cholecalciferol (VITAMIN D INFANT) 10 MCG/ML LIQD Take 400  Units by mouth daily.   citalopram (CELEXA) 40 MG tablet TAKE 1 TABLET BY MOUTH DAILY   Coenzyme Q10 300 MG CAPS Take 1 capsule by mouth daily.   cyanocobalamin (VITAMIN B12) 1000 MCG tablet Take 1,000 mcg by mouth daily.   DENTA 5000 PLUS 1.1 % CREA dental cream Take by mouth.   folic acid (FOLVITE) 400 MCG tablet Take 400 mcg by mouth daily.   ibuprofen (ADVIL) 800 MG tablet Take by mouth as needed.   ipratropium (ATROVENT) 0.03 % nasal spray Place 2 sprays into both nostrils every 12 (twelve) hours.   Magnesium Bisglycinate (MAG GLYCINATE PO) Take 360 mg by mouth at bedtime.   methocarbamol (ROBAXIN) 500 MG tablet Take 1 tablet (500 mg total) by mouth daily as needed for muscle spasms.   Multiple Vitamins-Minerals (EMERGEN-C IMMUNE) PACK Take by mouth as needed.   Multiple Vitamins-Minerals (ZINC PO) Take 1 capsule by mouth daily.   Omega-3 Fatty Acids (OMEGA-3 PO) Take 1 capsule by mouth daily.   omeprazole (PRILOSEC) 40 MG capsule Take 1 capsule (40 mg total) by mouth daily.   Oral Electrolytes (LIQUID I.V. PO) Take by mouth as needed (dehydration).   saccharomyces boulardii (FLORASTOR) 250 MG capsule Take 1 capsule (250 mg total) by mouth 2 (two) times daily.   Specialty Vitamins Products (HAIR NOURISHING SUPPLEMENT PO) Take by mouth. Nutrafol Brand   traZODone (DESYREL) 50 MG tablet TAKE 1 TABLET BY MOUTH AT BEDTIME   Wheat Dextrin (BENEFIBER DRINK MIX PO) Take 1 Package by mouth daily.   [DISCONTINUED] diazepam (VALIUM) 5 MG tablet Take 5 mg by mouth as needed for anxiety. For dental procedure   Collagenase POWD by Does not apply route. Vital Protein Collagen Peptides 2 scoops into coffee daily (Patient not taking: Reported on 09/08/2023)   diazepam (VALIUM) 5 MG tablet Take 1 tablet (5 mg total) by mouth as needed for anxiety. For dental procedure   No facility-administered encounter medications on file as of 09/08/2023.    Review of Systems  Constitutional:  Negative for appetite  change, chills, fatigue, fever and unexpected weight change.  Respiratory:  Negative for cough, chest tightness, shortness of breath and wheezing.   Cardiovascular:  Negative for chest pain, palpitations and leg swelling.  Gastrointestinal:  Positive for abdominal pain. Negative for abdominal distention, blood in stool, constipation, diarrhea, nausea and vomiting.  Genitourinary:  Negative for difficulty urinating, dysuria, flank pain, frequency and urgency.  Skin:  Negative for color change, pallor and rash.  Neurological:  Negative for dizziness, weakness, light-headedness and headaches.  Psychiatric/Behavioral:  Negative for agitation and sleep disturbance. The patient is not nervous/anxious.     Immunization History  Administered Date(s) Administered   Fluad Quad(high Dose 65+) 02/20/2022   Influenza Split 03/09/2019   Influenza, High Dose Seasonal PF 03/10/2018, 04/04/2020, 03/04/2023   PFIZER(Purple Top)SARS-COV-2 Vaccination 07/31/2019, 08/29/2019, 05/13/2020   Pneumococcal Conjugate-13 12/19/2015   Pneumococcal Polysaccharide-23 03/29/2001, 03/09/2019   Td 03/09/2019   Tdap 03/14/2008, 03/09/2019   Zoster Recombinant(Shingrix) 04/04/2020, 11/08/2020   Zoster, Live 07/29/2012  Pertinent  Health Maintenance Due  Topic Date Due   INFLUENZA VACCINE  12/31/2023   MAMMOGRAM  08/19/2024   Colonoscopy  08/23/2033   DEXA SCAN  Completed      05/12/2022    1:20 PM 06/15/2022    2:04 PM 08/14/2022    2:46 PM 12/25/2022   11:09 AM 09/08/2023    2:58 PM  Fall Risk  Falls in the past year? 0 0 0 0 0  Was there an injury with Fall? 0 0 0 0 0  Fall Risk Category Calculator 0 0 0 0 0  Fall Risk Category (Retired) Low      (RETIRED) Patient Fall Risk Level Low fall risk      Patient at Risk for Falls Due to No Fall Risks  No Fall Risks No Fall Risks No Fall Risks  Fall risk Follow up Falls evaluation completed Falls evaluation completed Falls evaluation completed Falls evaluation  completed Falls evaluation completed   Functional Status Survey:    Vitals:   09/08/23 1508  BP: 120/66  Pulse: 69  Resp: 19  Temp: 97.6 F (36.4 C)  SpO2: 99%  Weight: 127 lb 6.4 oz (57.8 kg)  Height: 5\' 3"  (1.6 m)   Body mass index is 22.57 kg/m. Physical Exam  VITALS: T- 97.6, P- 69, BP- 120/66 MEASUREMENTS: Weight- 127.4. GENERAL: Alert, cooperative, well developed, no acute distress. HEENT: Normocephalic, normal oropharynx, moist mucous membranes. CHEST: Clear to auscultation bilaterally, no wheezes, rhonchi, or crackles. CARDIOVASCULAR: Normal heart rate and rhythm, S1 and S2 normal without murmurs. ABDOMEN: Soft, Left lower quadrant mild tenderness, non-distended, without organomegaly, normal bowel sounds. EXTREMITIES: No cyanosis or edema. NEUROLOGICAL: Cranial nerves grossly intact, moves all extremities without gross motor or sensory deficit.  PSYCHIATRY/BEHAVIORAL: Mood stable   Labs reviewed: Recent Labs    12/24/22 1410 03/26/23 1620 07/27/23 1117  NA 134* 136 136  K 4.3 4.2 4.2  CL 97* 97 99  CO2 31 27 31   GLUCOSE 100* 131* 85  BUN 15 20 14   CREATININE 0.60 0.95 0.75  CALCIUM 10.6* 10.0 10.7*   Recent Labs    12/24/22 1410 07/27/23 1117  AST 26 32  ALT 23 32*  BILITOT 0.8 0.7  PROT 6.7 6.8   Recent Labs    03/26/23 1620 07/27/23 1117  WBC 4.2 3.8  NEUTROABS  --  1,809  HGB 11.8 12.9  HCT 36.2 38.4  MCV 91 90.1  PLT 232 234   Lab Results  Component Value Date   TSH 1.70 11/04/2021   Lab Results  Component Value Date   HGBA1C 5.2 12/09/2021   Lab Results  Component Value Date   CHOL 228 (H) 07/27/2023   HDL 109 07/27/2023   LDLCALC 103 (H) 07/27/2023   TRIG 74 07/27/2023   CHOLHDL 2.1 07/27/2023    Significant Diagnostic Results in last 30 days:  No results found.  Assessment/Plan  Chronic Diverticulitis Chronic diverticulitis with recent exacerbation presenting as left lower quadrant abdominal pain, low-grade fever  (99.68F), and diarrhea without blood. Fever has improved, but pain persists. Colonoscopy revealed multiple medium to large diverticula in the sigmoid, descending, transverse, and ascending colon. She has adverse reactions to Cipro and metronidazole and prefers amoxicillin following a negative penicillin allergy test. She is aware of the hereditary nature of the condition and maintains a healthy diet. She prefers to avoid surgery due to balanced risks and benefits. - Prescribe Augmentin (amoxicillin/clavulanate) one tablet in the morning and one in  the evening with food x 7 days. - Continue probiotics (Florastor) twice daily. - Monitor for fever and report any recurrence. - Encourage hydration, especially if diarrhea occurs.  Colonic Polyps Colonoscopy on March 25th revealed two polyps in the ascending and transverse colon, each 5-6 mm, which were removed. Awaiting further results from the colonoscopy. - Request colonoscopy results to be sent to the clinic for review.  General Health Maintenance Maintains a healthy diet and regular probiotic use to manage gastrointestinal health. Experienced weight fluctuations due to colonoscopy preparation and diverticulitis flare-ups. - Encourage continued healthy eating habits and regular probiotic use.  Family/ staff Communication: Reviewed plan of care with patient verbalized understanding  Labs/tests ordered: None   Next Appointment: Return if symptoms worsen or fail to improve.   Total time: 25 minutes. Greater than 50% of total time spent doing patient education regarding diverticulitis, history of colonic polyps and health maintenance including symptom/medication management.   Estil Heman, NP

## 2023-09-20 ENCOUNTER — Other Ambulatory Visit: Payer: Self-pay | Admitting: Nurse Practitioner

## 2023-09-20 DIAGNOSIS — Z1231 Encounter for screening mammogram for malignant neoplasm of breast: Secondary | ICD-10-CM

## 2023-09-21 ENCOUNTER — Ambulatory Visit
Admission: RE | Admit: 2023-09-21 | Discharge: 2023-09-21 | Disposition: A | Discharge status: Home / Self Care | Source: Ambulatory Visit | Attending: Nurse Practitioner | Admitting: Nurse Practitioner

## 2023-09-21 DIAGNOSIS — Z1231 Encounter for screening mammogram for malignant neoplasm of breast: Secondary | ICD-10-CM | POA: Diagnosis not present

## 2023-09-27 ENCOUNTER — Other Ambulatory Visit: Payer: Self-pay | Admitting: Nurse Practitioner

## 2023-09-27 DIAGNOSIS — K219 Gastro-esophageal reflux disease without esophagitis: Secondary | ICD-10-CM

## 2023-11-01 ENCOUNTER — Ambulatory Visit: Payer: Self-pay

## 2023-11-01 ENCOUNTER — Ambulatory Visit (INDEPENDENT_AMBULATORY_CARE_PROVIDER_SITE_OTHER): Admitting: Family

## 2023-11-01 ENCOUNTER — Telehealth

## 2023-11-01 ENCOUNTER — Encounter: Payer: Self-pay | Admitting: Family

## 2023-11-01 VITALS — BP 126/80 | HR 62 | Temp 97.7°F | Ht 63.0 in | Wt 119.0 lb

## 2023-11-01 DIAGNOSIS — R5383 Other fatigue: Secondary | ICD-10-CM | POA: Diagnosis not present

## 2023-11-01 DIAGNOSIS — I10 Essential (primary) hypertension: Secondary | ICD-10-CM

## 2023-11-01 DIAGNOSIS — K219 Gastro-esophageal reflux disease without esophagitis: Secondary | ICD-10-CM

## 2023-11-01 DIAGNOSIS — E785 Hyperlipidemia, unspecified: Secondary | ICD-10-CM

## 2023-11-01 DIAGNOSIS — R634 Abnormal weight loss: Secondary | ICD-10-CM | POA: Diagnosis not present

## 2023-11-01 NOTE — Progress Notes (Signed)
 Provider: Avon Mergenthaler FNP-C  Verma Gobble, NP  Patient Care Team: Verma Gobble, NP as PCP - General (Geriatric Medicine) Lucendia Rusk, MD as PCP - Cardiology (Cardiology) Phebe Brasil, MD as Consulting Physician (Neurology)  Extended Emergency Contact Information Primary Emergency Contact: Jordan,Arthur  [place] Home Phone: [phone] Relation: Significant other Secondary Emergency Contact: Blaydes,Clark Mobile Phone: [phone] Relation: Son  Code Status: Full Code  Goals of care: Advanced Directive information    09/08/2023    2:59 PM  Advanced Directives  Does Patient Have a Medical Advance Directive? No  Would patient like information on creating a medical advance directive? No - Patient declined     Chief Complaint  Patient presents with   Fatigue    Notice had she felt  had covid, she is said felt tried and low energy, 9lbs in few weeks , not wanting to eat much.     Discussed the use of AI scribe software for clinical note transcription with the patient, who gave verbal consent to proceed.  History of Present Illness   Mariah Romero is a 73 year old female who presents with unintentional weight loss and fatigue.  She has experienced significant unintentional weight loss over the past few months, dropping from 130 pounds in March to 116 pounds recently, despite not attempting to lose weight. She notes a lack of appetite and low energy levels, which have impacted her ability to complete daily tasks. Her energy is described as 'so low', and others perceive her as depressed due to her frequent need to rest, although she denies feeling depressed.  Her diet is nutrient-rich, avoiding sweets, and includes meals such as a fried egg or half a sandwich for breakfast, yogurt with nuts and blueberries, and a dinner that includes meat or chicken. She occasionally skips meals due to her lack of appetite.  She has a history of  chronic diverticulitis, which she manages with a good diet, including daily yogurt and probiotics. She reports a good spell of keeping her diverticulitis in check recently.  She has a family history of diabetes, with her mother having a history of the condition. She has experienced low blood sugar in the past but has not been diagnosed with diabetes or prediabetes. She expresses concern about her blood sugar levels.  She takes sublingual B12 supplements but questions their absorption, noting that her mother received B12 shots regularly. She also mentions fluctuating cholesterol levels, with good cholesterol being high and bad cholesterol having sporadic high markers.  She has previously experienced acid reflux, which was managed with medication, but she has since weaned off the medication and reports no current issues with reflux.  She recently returned from a boat cruise and a trip to care for her mother, indicating an active lifestyle despite her fatigue.    Past Medical History:  Diagnosis Date   Anxiety and depression    Breast cancer (HCC) 2001   RIGHT SIDE. S/P CHEMOTHERAPY   COVID 11/29/2020   Headache    Mitral valve regurgitation    Osteoporosis    Personal history of chemotherapy    Personal history of radiation therapy    Past Surgical History:  Procedure Laterality Date   APPENDECTOMY     AT AGE 49 YEARS OLD   BREAST LUMPECTOMY Right 2001   AT AGE 81 YEARS OLD   CHOLECYSTECTOMY     GALLBLADDER SURGERY  2012   TEE WITHOUT CARDIOVERSION N/A 03/29/2023   Procedure:  TRANSESOPHAGEAL ECHOCARDIOGRAM;  Surgeon: Hazle Lites, MD;  Location: [facility] INVASIVE CV LAB;  Service: Cardiovascular;  Laterality: N/A;    Allergies  Allergen Reactions   Codeine     RINGING IN THE EARS.    Other Other (See Comments)    PAIN KILLERS   Oxycodone Itching   Sulfa Antibiotics Other (See Comments)   Ibuprofen Rash    Outpatient Encounter Medications as of 11/01/2023  Medication Sig    aspirin  EC 81 MG tablet Take 1 tablet (81 mg total) by mouth daily. Swallow whole.   atenolol  (TENORMIN ) 25 MG tablet Take 1 tablet (25 mg total) by mouth daily.   atorvastatin  (LIPITOR) 10 MG tablet Take 1 tablet (10 mg total) by mouth daily.   B Complex-C (B-COMPLEX WITH VITAMIN C ) tablet Take 1 tablet by mouth daily.   Biotin 10 MG CAPS Take by mouth.   cholecalciferol (VITAMIN D  INFANT) 10 MCG/ML LIQD Take 400 Units by mouth daily.   citalopram  (CELEXA ) 40 MG tablet TAKE 1 TABLET BY MOUTH DAILY   Coenzyme Q10 300 MG CAPS Take 1 capsule by mouth daily.   cyanocobalamin (VITAMIN B12) 1000 MCG tablet Take 1,000 mcg by mouth daily.   DENTA 5000 PLUS 1.1 % CREA dental cream Take by mouth.   diazepam  (VALIUM ) 5 MG tablet Take 1 tablet (5 mg total) by mouth as needed for anxiety. For dental procedure   folic acid (FOLVITE) 400 MCG tablet Take 400 mcg by mouth daily.   ibuprofen (ADVIL) 800 MG tablet Take by mouth as needed.   ipratropium (ATROVENT ) 0.03 % nasal spray Place 2 sprays into both nostrils every 12 (twelve) hours.   Magnesium Bisglycinate (MAG GLYCINATE PO) Take 360 mg by mouth at bedtime.   methocarbamol  (ROBAXIN ) 500 MG tablet Take 1 tablet (500 mg total) by mouth daily as needed for muscle spasms.   Multiple Vitamins-Minerals (ZINC PO) Take 1 capsule by mouth daily.   Omega-3 Fatty Acids (OMEGA-3 PO) Take 1 capsule by mouth daily.   Oral Electrolytes (LIQUID I.V. PO) Take by mouth as needed (dehydration).   saccharomyces boulardii (FLORASTOR) 250 MG capsule Take 1 capsule (250 mg total) by mouth 2 (two) times daily.   Specialty Vitamins Products (HAIR NOURISHING SUPPLEMENT PO) Take by mouth. Nutrafol Brand   traZODone  (DESYREL ) 50 MG tablet TAKE 1 TABLET BY MOUTH AT BEDTIME   Wheat Dextrin (BENEFIBER DRINK MIX PO) Take 1 Package by mouth daily.   ascorbic acid (VITAMIN C ) 500 MG tablet Take 500 mg by mouth as needed. Off/on (Patient not taking: Reported on 11/01/2023)   Collagenase  POWD by Does not apply route. Vital Protein Collagen Peptides 2 scoops into coffee daily (Patient not taking: Reported on 11/01/2023)   Multiple Vitamins-Minerals (EMERGEN-C IMMUNE) PACK Take by mouth as needed. (Patient not taking: Reported on 11/01/2023)   omeprazole  (PRILOSEC) 40 MG capsule TAKE 1 CAPSULE BY MOUTH DAILY (Patient not taking: Reported on 11/01/2023)   No facility-administered encounter medications on file as of 11/01/2023.    Review of Systems  Constitutional:  Positive for fatigue and unexpected weight change. Negative for appetite change, chills and fever.       Weight loss   HENT:  Negative for congestion, dental problem, ear discharge, ear pain, facial swelling, hearing loss, nosebleeds, postnasal drip, rhinorrhea, sinus pressure, sinus pain, sneezing, sore throat, tinnitus and trouble swallowing.   Eyes:  Negative for pain, discharge, redness, itching and visual disturbance.  Respiratory:  Negative for cough, chest  tightness, shortness of breath and wheezing.   Cardiovascular:  Negative for chest pain, palpitations and leg swelling.  Gastrointestinal:  Negative for abdominal distention, abdominal pain, blood in stool, constipation, diarrhea, nausea and vomiting.  Endocrine: Negative for cold intolerance, heat intolerance, polydipsia, polyphagia and polyuria.  Genitourinary:  Negative for difficulty urinating, dysuria, flank pain, frequency and urgency.  Musculoskeletal:  Negative for arthralgias, back pain, gait problem, joint swelling and myalgias.  Skin:  Negative for color change, pallor and rash.  Neurological:  Negative for dizziness, syncope, speech difficulty, weakness, light-headedness, numbness and headaches.  Psychiatric/Behavioral:  Negative for agitation, behavioral problems, hallucinations and sleep disturbance. The patient is not nervous/anxious.     Immunization History  Administered Date(s) Administered   Fluad Quad(high Dose 65+) 02/20/2022   Influenza Split  03/09/2019   Influenza, High Dose Seasonal PF 03/10/2018, 04/04/2020, 03/04/2023   PFIZER(Purple Top)SARS-COV-2 Vaccination 07/31/2019, 08/29/2019, 05/13/2020   Pneumococcal Conjugate-13 12/19/2015   Pneumococcal Polysaccharide-23 03/29/2001, 03/09/2019   Td 03/09/2019   Tdap 03/14/2008, 03/09/2019   Zoster Recombinant(Shingrix) 04/04/2020, 11/08/2020   Zoster, Live 07/29/2012   Pertinent  Health Maintenance Due  Topic Date Due   INFLUENZA VACCINE  12/31/2023   MAMMOGRAM  09/20/2025   Colonoscopy  08/23/2033   DEXA SCAN  Completed      06/15/2022    2:04 PM 08/14/2022    2:46 PM 12/25/2022   11:09 AM 09/08/2023    2:58 PM 11/01/2023    2:19 PM  Fall Risk  Falls in the past year? 0 0 0 0 0  Was there an injury with Fall? 0 0 0 0 0  Fall Risk Category Calculator 0 0 0 0 0  Patient at Risk for Falls Due to  No Fall Risks No Fall Risks No Fall Risks No Fall Risks  Fall risk Follow up Falls evaluation completed Falls evaluation completed Falls evaluation completed Falls evaluation completed Falls evaluation completed   Functional Status Survey:    Vitals:   11/01/23 1415  BP: 126/80  Pulse: 62  Temp: 97.7 F (36.5 C)  TempSrc: Temporal  SpO2: 98%  Weight: 119 lb (54 kg)  Height: 5\' 3"  (1.6 m)   Body mass index is 21.08 kg/m. Physical Exam MEASUREMENTS: Weight- 119. GENERAL: Alert, cooperative, well developed, no acute distress. HEENT: Normocephalic, normal oropharynx, moist mucous membranes, no sinus tenderness. CHEST: Clear to auscultation bilaterally, no wheezes, rhonchi, or crackles. CARDIOVASCULAR: Normal heart rate and rhythm, S1 and S2 normal without murmurs. ABDOMEN: Soft, non-tender, non-distended, without organomegaly, normal bowel sounds. EXTREMITIES: No cyanosis, edema, or swelling. NEUROLOGICAL: Cranial nerves grossly intact, moves all extremities without gross motor or sensory deficit.  PSYCHIATRY/BEHAVIORAL: Mood stable    Labs reviewed: Recent Labs     12/24/22 1410 03/26/23 1620 07/27/23 1117  NA 134* 136 136  K 4.3 4.2 4.2  CL 97* 97 99  CO2 31 27 31   GLUCOSE 100* 131* 85  BUN 15 20 14   CREATININE 0.60 0.95 0.75  CALCIUM  10.6* 10.0 10.7*   Recent Labs    12/24/22 1410 07/27/23 1117  AST 26 32  ALT 23 32*  BILITOT 0.8 0.7  PROT 6.7 6.8   Recent Labs    03/26/23 1620 07/27/23 1117  WBC 4.2 3.8  NEUTROABS  --  1,809  HGB 11.8 12.9  HCT 36.2 38.4  MCV 91 90.1  PLT 232 234   Lab Results  Component Value Date   TSH 1.70 11/04/2021   Lab Results  Component  Value Date   HGBA1C 5.2 12/09/2021   Lab Results  Component Value Date   CHOL 228 (H) 07/27/2023   HDL 109 07/27/2023   LDLCALC 103 (H) 07/27/2023   TRIG 74 07/27/2023   CHOLHDL 2.1 07/27/2023    Significant Diagnostic Results in last 30 days:  No results found.  Assessment/Plan Unintentional weight loss Weight decreased from 130 lbs in March to 119 lbs currently. Reports no appetite and not attempting weight loss. Differential diagnosis includes thyroid  dysfunction, nutritional deficiencies, and possible glucose dysregulation. Family history of diabetes. - Order TSH to evaluate thyroid  function - Order CBC to check for anemia and other hematological issues - Order fasting glucose to assess for glucose dysregulation - Consider A1c if glucose levels are high  Fatigue Persistent fatigue. Possible causes include thyroid  dysfunction, vitamin B12 deficiency, and anemia. Takes sublingual B12 but absorption is uncertain. - Order TSH to evaluate thyroid  function - Order CBC to check for anemia - Order B12 level to assess for deficiency  Diverticulitis Diverticulitis is well-controlled with no recent episodes of tenderness or pain. Adheres to a diet with probiotics and yogurt. - Continue current dietary regimen with probiotics and yogurt   Hypertension  - B/p well controlled  -continue current medication  - request fasting labs   Hyperlipidemia   Previous LDL not at goal -continue on Lipitor  - fasting for lab work today   GERD  - symptoms well controlled   Diverticulitis  - symptoms well controlled on current diet     Family/ staff Communication: Reviewed plan of care with patient verbalized understanding   Labs/tests ordered:  - CBC with Differential/Platelet - CMP with eGFR([facility]) - TSH - Lipid panel - Vitamin B 12   Next Appointment: Return if symptoms worsen or fail to improve.   Total time: minutes. Greater than 50% of total time spent doing patient education regarding fatigue,weigth loss,HTN,HLD,GERD,diverticulitis, health maintenance including symptom/medication management.   Estil Heman, NP

## 2023-11-01 NOTE — Telephone Encounter (Signed)
 Copied from CRM [id number]. Topic: Clinical - Red Word Triage >> Nov 01, 2023  9:28 AM Danelle Dunning F wrote: [facility] that prompted transfer to Nurse Triage:   Extreme fatigue; Started all last week; Patient is inquiring about possibly coming in to have some labs completed   Chief Complaint: Fatigue Symptoms: losing weight, poor appetite, feels exhauster Frequency: constant Pertinent Negatives: Patient denies fever Disposition: [] ED /[] Urgent Care (no appt availability in office) / [x] Appointment(In office/virtual)/ []  [facility]/ [] Home Care/ [] Refused Recommended Disposition /[]  Mobile Bus/ []  Follow-up with PCP Additional Notes: Pt reports fatigue  x 1 week. Unsure if its related to her lack of iodine in her diet. Pt reports having some steak the other day and felt a burst of energy so she has concern for anemia as well. Appt scheduled for today at 2pm  Reason for Disposition  [1] MODERATE weakness (i.e., interferes with work, school, normal activities) AND [2] persists > 3 days  Answer Assessment - Initial Assessment Questions 1. DESCRIPTION: "Describe how you are feeling."     Constantly needing to rest, tired, has to stop constantly  2. SEVERITY: "How bad is it?"  "Can you stand and walk?"   - MILD (0-3): Feels weak or tired, but does not interfere with work, school or normal activities.   - MODERATE (4-7): Able to stand and walk; weakness interferes with work, school, or normal activities.   - SEVERE (8-10): Unable to stand or walk; unable to do usual activities.     Moderate, still functioning, but gets to a point where she is done and need to sit for an hour.   3. ONSET: "When did these symptoms begin?" (e.g., hours, days, weeks, months)     Started last week  4. CAUSE: "What do you think is causing the weakness or fatigue?" (e.g., not drinking enough fluids, medical problem, trouble sleeping)     Unsure of cause  5. NEW MEDICINES:  "Have you started on any  new medicines recently?" (e.g., opioid pain medicines, benzodiazepines, muscle relaxants, antidepressants, antihistamines, neuroleptics, beta blockers)     No new meds  6. OTHER SYMPTOMS: "Do you have any other symptoms?" (e.g., chest pain, fever, cough, SOB, vomiting, diarrhea, bleeding, other areas of pain)     Losing weight, no appetite, has chronic diverticulitis so bowels are always irregular  7. PREGNANCY: "Is there any chance you are pregnant?" "When was your last menstrual period?"     no  Protocols used: Weakness (Generalized) and Fatigue-A-AH

## 2023-11-01 NOTE — Telephone Encounter (Signed)
 Copied from CRM [id number]. Topic: Clinical - Medical Advice >> Oct 29, 2023  3:09 PM Shelby Dessert H wrote: Reason for CRM: Patient is feeling fine, she doe not feel sick but it quiet fatigued, she has little to no energy, she has lost about 9 lbs she checked her weight this morning and was 117, she thinks she might just need to do blood work, patients callback number is [phone].

## 2023-11-01 NOTE — Telephone Encounter (Signed)
 Please schedule acute visit with Camilo Cella to discuss.

## 2023-11-01 NOTE — Telephone Encounter (Signed)
 Message routed to Admin staff to scheduled patient appointment with PCP Roselie Conger Jessica K, NP

## 2023-11-01 NOTE — Telephone Encounter (Signed)
 See triage notes from triage nurse FYI, patient has appointment to see Ngetich, Elijio Guadeloupe, NP.

## 2023-11-01 NOTE — Telephone Encounter (Signed)
 Message routed to Ulyses Gandy, NP due to PCP Roselie Conger Champ Coma, NP being out of office.

## 2023-11-01 NOTE — Telephone Encounter (Signed)
 Addressed during visit today

## 2023-11-02 ENCOUNTER — Ambulatory Visit: Payer: Self-pay | Admitting: Family

## 2023-11-02 LAB — CBC WITH DIFFERENTIAL/PLATELET
Absolute Lymphocytes: 907 {cells}/uL (ref 850–3900)
Absolute Monocytes: 455 {cells}/uL (ref 200–950)
Basophils Absolute: 32 {cells}/uL (ref 0–200)
Basophils Relative: 0.9 %
Eosinophils Absolute: 49 {cells}/uL (ref 15–500)
Eosinophils Relative: 1.4 %
HCT: 38.3 % (ref 35.0–45.0)
Hemoglobin: 12.7 g/dL (ref 11.7–15.5)
MCH: 29.6 pg (ref 27.0–33.0)
MCHC: 33.2 g/dL (ref 32.0–36.0)
MCV: 89.3 fL (ref 80.0–100.0)
MPV: 10 fL (ref 7.5–12.5)
Monocytes Relative: 13 %
Neutro Abs: 2058 {cells}/uL (ref 1500–7800)
Neutrophils Relative %: 58.8 %
Platelets: 227 10*3/uL (ref 140–400)
RBC: 4.29 10*6/uL (ref 3.80–5.10)
RDW: 12.8 % (ref 11.0–15.0)
Total Lymphocyte: 25.9 %
WBC: 3.5 10*3/uL — ABNORMAL LOW (ref 3.8–10.8)

## 2023-11-02 LAB — LIPID PANEL
Cholesterol: 209 mg/dL — ABNORMAL HIGH (ref <200)
HDL: 115 mg/dL (ref >=50)
LDL Cholesterol (Calc): 79 mg/dL
Non-HDL Cholesterol (Calc): 94 mg/dL (ref <130)
Total CHOL/HDL Ratio: 1.8 (calc) (ref <5.0)
Triglycerides: 66 mg/dL (ref <150)

## 2023-11-02 LAB — COMPLETE METABOLIC PANEL WITHOUT GFR
AG Ratio: 2.4 (calc) (ref 1.0–2.5)
ALT: 31 U/L — ABNORMAL HIGH (ref 6–29)
AST: 33 U/L (ref 10–35)
Albumin: 4.8 g/dL (ref 3.6–5.1)
Alkaline phosphatase (APISO): 49 U/L (ref 37–153)
BUN/Creatinine Ratio: 19 (calc) (ref 6–22)
BUN: 11 mg/dL (ref 7–25)
CO2: 29 mmol/L (ref 20–32)
Calcium: 10.1 mg/dL (ref 8.6–10.4)
Chloride: 96 mmol/L — ABNORMAL LOW (ref 98–110)
Creat: 0.59 mg/dL — ABNORMAL LOW (ref 0.60–1.00)
Globulin: 2 g/dL (ref 1.9–3.7)
Glucose, Bld: 92 mg/dL (ref 65–99)
Potassium: 4 mmol/L (ref 3.5–5.3)
Sodium: 133 mmol/L — ABNORMAL LOW (ref 135–146)
Total Bilirubin: 0.9 mg/dL (ref 0.2–1.2)
Total Protein: 6.8 g/dL (ref 6.1–8.1)

## 2023-11-02 LAB — TSH: TSH: 0.65 m[IU]/L (ref 0.40–4.50)

## 2023-11-02 LAB — VITAMIN B12: Vitamin B-12: 1409 pg/mL — ABNORMAL HIGH (ref 200–1100)

## 2023-11-03 DIAGNOSIS — H43813 Vitreous degeneration, bilateral: Secondary | ICD-10-CM | POA: Diagnosis not present

## 2023-11-03 DIAGNOSIS — G43B0 Ophthalmoplegic migraine, not intractable: Secondary | ICD-10-CM | POA: Diagnosis not present

## 2023-11-03 DIAGNOSIS — H11043 Peripheral pterygium, stationary, bilateral: Secondary | ICD-10-CM | POA: Diagnosis not present

## 2023-11-03 DIAGNOSIS — H2513 Age-related nuclear cataract, bilateral: Secondary | ICD-10-CM | POA: Diagnosis not present

## 2023-11-05 ENCOUNTER — Encounter: Payer: Self-pay | Admitting: Adult Health

## 2023-11-05 ENCOUNTER — Ambulatory Visit: Admitting: Adult Health

## 2023-11-05 ENCOUNTER — Ambulatory Visit: Payer: Self-pay

## 2023-11-05 VITALS — BP 116/75 | HR 64 | Temp 97.1°F | Resp 18 | Ht 63.0 in | Wt 119.2 lb

## 2023-11-05 DIAGNOSIS — F5101 Primary insomnia: Secondary | ICD-10-CM | POA: Diagnosis not present

## 2023-11-05 DIAGNOSIS — I10 Essential (primary) hypertension: Secondary | ICD-10-CM

## 2023-11-05 DIAGNOSIS — E559 Vitamin D deficiency, unspecified: Secondary | ICD-10-CM

## 2023-11-05 DIAGNOSIS — R5383 Other fatigue: Secondary | ICD-10-CM

## 2023-11-05 NOTE — Telephone Encounter (Signed)
 FYI Only or Action Required?: FYI only for provider  Patient was last seen in primary care on 11/01/2023 by Ngetich, Elijio Guadeloupe, NP. Called Nurse Triage reporting Dizziness. Symptoms began several days ago. Interventions attempted: Rest, hydration, or home remedies. Symptoms are: gradually worsening.  Triage Disposition: See Physician Within 24 Hours  Patient/caregiver understands and will follow disposition?: Yes

## 2023-11-05 NOTE — Telephone Encounter (Signed)
            Copied from CRM [id number]. Topic: Clinical - Pink Word Triage >> Nov 05, 2023  9:29 AM Karole Pacer C wrote: Reason for Triage: Patient states she has been loosing weight unintentionally weighing 115 in March she was 130. Patient has noticed she has become more light headed and nauseous to eat her dinner. Patient would like a call back at [phone] she saw that her symptoms maybe associated with liver failure and would like some blood work to be done. Reason for Disposition . [1] MODERATE dizziness (e.g., interferes with normal activities) AND [2] has NOT been evaluated by doctor (or NP/PA) for this  (Exception: Dizziness caused by heat exposure, sudden standing, or poor fluid intake.)  Answer Assessment - Initial Assessment Questions 1. DESCRIPTION: "Describe your dizziness."     Not dizziness 2. LIGHTHEADED: "Do you feel lightheaded?" (e.g., somewhat faint, woozy, weak upon standing)     Weak, yes unsteady 3. VERTIGO: "Do you feel like either you or the room is spinning or tilting?" (i.e. vertigo)     no 4. SEVERITY: "How bad is it?"  "Do you feel like you are going to faint?" "Can you stand and walk?"   - MILD: Feels slightly dizzy, but walking normally.   - MODERATE: Feels unsteady when walking, but not falling; interferes with normal activities (e.g., school, work).   - SEVERE: Unable to walk without falling, or requires assistance to walk without falling; feels like passing out now.      moderate 5. ONSET:  "When did the dizziness begin?"     This week 6. AGGRAVATING FACTORS: "Does anything make it worse?" (e.g., standing, change in head position)     ADL;s 8. CAUSE: "What do you think is causing the dizziness?"     Unsure if liver 10. OTHER SYMPTOMS: "Do you have any other symptoms?" (e.g., fever, chest pain, vomiting, diarrhea, bleeding)       Weight loss, loss of appetite, nauseated, fatigue-  Protocols used: Dizziness - Lightheadedness-A-AH

## 2023-11-05 NOTE — Progress Notes (Signed)
 [facility]  Provider:  Inge Mangle DNP  Code Status:  Full Code  Goals of Care:     09/08/2023    2:59 PM  Advanced Directives  Does Patient Have a Medical Advance Directive? No  Would patient like information on creating a medical advance directive? No - Patient declined     Chief Complaint  Patient presents with   Fatigue    fatigue, weakness, lighheadedness    Discussed the use of AI scribe software for clinical note transcription with the patient, who gave verbal consent to proceed.  HPI: Patient is a 73 y.o. female seen today for an acute visit for feeling fatigued, weakness and lightheadedness.She was accompanied by her husband.  She has experienced unexplained weight loss over the past few weeks, initially perceived as positive but later concerning due to associated symptoms. Despite maintaining a healthy diet, she notes a decreased appetite and continued weight loss despite efforts to increase her food intake.  She describes severe fatigue that has progressively worsened, leaving her feeling 'flat out exhausted' and 'very weak'. This fatigue has significantly impacted her daily activities, making it difficult to perform tasks without needing to rest. She also reports feeling lightheaded and unsteady when standing or walking. Aden Honor, husband,  notes that her fatigue became noticeable towards the end of a recent cruise, worsening daily since returning home on May 11.  She has a history of palpitations but has not experienced them recently. Her recent blood work showed elevated vitamin B12 levels, which she attributes to taking 1000 mcg of B12 daily. Her recent metabolic panel showed slightly elevated ALT and low sodium levels. She expresses concern about the implications of high B12 levels, particularly regarding potential kidney or liver issues.  She has a history of chronic diverticulitis, which she manages with probiotics, prebiotics, and yogurt, currently  experiencing no issues. She underwent a colonoscopy on March 25, which revealed a precancerous polyp. She also has a history of spinal stenosis, which causes back pain managed with a heating pad.  Her current medications include atenolol  25 mg daily for hypertension and trazodone  50 mg at bedtime for insomnia, which she has taken for years. She also takes atorvastatin  10 mg daily for cholesterol management. She takes vitamin D3 drops, though she is unsure of the exact dosage.  She reports a history of insomnia since childhood, requiring trazodone  nightly to sleep. She estimates getting between six and a half to eight hours of sleep per night, though her partner suggests it may be less due to her use of electronic devices before bed.  She drinks three large glasses of water daily and denies any issues with urination. She experiences occasional diarrhea, described as 'sludgy'. No recent palpitations, no depression, no respiratory symptoms, no headaches.     Past Medical History:  Diagnosis Date   Anxiety and depression    Breast cancer (HCC) 2001   RIGHT SIDE. S/P CHEMOTHERAPY   COVID 11/29/2020   Headache    Mitral valve regurgitation    Osteoporosis    Personal history of chemotherapy    Personal history of radiation therapy     Past Surgical History:  Procedure Laterality Date   APPENDECTOMY     AT AGE 81 YEARS OLD   BREAST LUMPECTOMY Right 2001   AT AGE 48 YEARS OLD   CHOLECYSTECTOMY     GALLBLADDER SURGERY  2012   TEE WITHOUT CARDIOVERSION N/A 03/29/2023   Procedure: TRANSESOPHAGEAL ECHOCARDIOGRAM;  Surgeon: Hazle Lites, MD;  Location: [facility];  Service: Cardiovascular;  Laterality: N/A;    Allergies  Allergen Reactions   Codeine     RINGING IN THE EARS.    Other Other (See Comments)    PAIN KILLERS   Oxycodone Itching   Sulfa Antibiotics Other (See Comments)   Ibuprofen Rash    Outpatient Encounter Medications as of 11/05/2023  Medication Sig    ascorbic acid (VITAMIN C ) 500 MG tablet Take 500 mg by mouth as needed. Off/on   aspirin  EC 81 MG tablet Take 1 tablet (81 mg total) by mouth daily. Swallow whole.   atenolol  (TENORMIN ) 25 MG tablet Take 1 tablet (25 mg total) by mouth daily.   atorvastatin  (LIPITOR) 10 MG tablet Take 1 tablet (10 mg total) by mouth daily.   B Complex-C (B-COMPLEX WITH VITAMIN C ) tablet Take 1 tablet by mouth daily.   Biotin 10 MG CAPS Take by mouth.   cholecalciferol (VITAMIN D  INFANT) 10 MCG/ML LIQD Take 400 Units by mouth daily.   citalopram  (CELEXA ) 40 MG tablet TAKE 1 TABLET BY MOUTH DAILY   Coenzyme Q10 300 MG CAPS Take 1 capsule by mouth daily.   Collagenase POWD by Does not apply route. Vital Protein Collagen Peptides 2 scoops into coffee daily   cyanocobalamin (VITAMIN B12) 1000 MCG tablet Take 1,000 mcg by mouth daily.   DENTA 5000 PLUS 1.1 % CREA dental cream Take by mouth.   diazepam  (VALIUM ) 5 MG tablet Take 1 tablet (5 mg total) by mouth as needed for anxiety. For dental procedure   folic acid (FOLVITE) 400 MCG tablet Take 400 mcg by mouth daily.   ibuprofen (ADVIL) 800 MG tablet Take by mouth as needed.   ipratropium (ATROVENT ) 0.03 % nasal spray Place 2 sprays into both nostrils every 12 (twelve) hours.   Magnesium Bisglycinate (MAG GLYCINATE PO) Take 360 mg by mouth at bedtime.   methocarbamol  (ROBAXIN ) 500 MG tablet Take 1 tablet (500 mg total) by mouth daily as needed for muscle spasms.   Multiple Vitamins-Minerals (ZINC PO) Take 1 capsule by mouth daily.   Omega-3 Fatty Acids (OMEGA-3 PO) Take 1 capsule by mouth daily.   Oral Electrolytes (LIQUID I.V. PO) Take by mouth as needed (dehydration).   saccharomyces boulardii (FLORASTOR) 250 MG capsule Take 1 capsule (250 mg total) by mouth 2 (two) times daily.   Specialty Vitamins Products (HAIR NOURISHING SUPPLEMENT PO) Take by mouth. Nutrafol Brand   traZODone  (DESYREL ) 50 MG tablet TAKE 1 TABLET BY MOUTH AT BEDTIME   Wheat Dextrin (BENEFIBER  DRINK MIX PO) Take 1 Package by mouth daily.   No facility-administered encounter medications on file as of 11/05/2023.    Review of Systems:  Review of Systems  Constitutional:  Positive for fatigue. Negative for appetite change, chills and fever.  HENT:  Negative for congestion, hearing loss, rhinorrhea and sore throat.   Eyes: Negative.   Respiratory:  Negative for cough, shortness of breath and wheezing.   Cardiovascular:  Negative for chest pain, palpitations and leg swelling.  Gastrointestinal:  Negative for abdominal pain, constipation, diarrhea, nausea and vomiting.  Genitourinary:  Negative for dysuria.  Musculoskeletal:  Negative for arthralgias, back pain and myalgias.  Skin:  Negative for color change, rash and wound.  Neurological:  Positive for weakness and light-headedness. Negative for dizziness and headaches.  Psychiatric/Behavioral:  Negative for behavioral problems. The patient is not nervous/anxious.     Health Maintenance  Topic Date Due   Medicare Annual Wellness (  AWV)  06/16/2023   COVID-19 Vaccine (4 - 2024-25 season) 06/01/2038 (Originally 01/31/2023)   INFLUENZA VACCINE  12/31/2023   MAMMOGRAM  09/20/2025   DTaP/Tdap/Td (4 - Td or Tdap) 03/08/2029   Colonoscopy  08/23/2033   Pneumonia Vaccine 90+ Years old  Completed   DEXA SCAN  Completed   Zoster Vaccines- Shingrix  Completed   HPV VACCINES  Aged Out   Meningococcal B Vaccine  Aged Out   Hepatitis C Screening  Discontinued    Physical Exam: Vitals:   11/05/23 1311  BP: 116/75  Pulse: 64  Resp: 18  Temp: (!) 97.1 F (36.2 C)  SpO2: 98%  Weight: 119 lb 3.2 oz (54.1 kg)  Height: 5\' 3"  (1.6 m)   Body mass index is 21.12 kg/m. Physical Exam Constitutional:      Appearance: Normal appearance.  HENT:     Head: Normocephalic and atraumatic.     Nose: Nose normal.     Mouth/Throat:     Mouth: Mucous membranes are moist.  Eyes:     Conjunctiva/sclera: Conjunctivae normal.  Cardiovascular:      Rate and Rhythm: Normal rate and regular rhythm.  Pulmonary:     Effort: Pulmonary effort is normal.     Breath sounds: Normal breath sounds.  Abdominal:     General: Bowel sounds are normal.     Palpations: Abdomen is soft.  Musculoskeletal:        General: Normal range of motion.     Cervical back: Normal range of motion.  Skin:    General: Skin is warm and dry.  Neurological:     General: No focal deficit present.     Mental Status: She is alert and oriented to person, place, and time.  Psychiatric:        Mood and Affect: Mood normal.        Behavior: Behavior normal.        Thought Content: Thought content normal.        Judgment: Judgment normal.     Labs reviewed: Basic Metabolic Panel: Recent Labs    03/26/23 1620 07/27/23 1117 11/01/23 1510  NA 136 136 133*  K 4.2 4.2 4.0  CL 97 99 96*  CO2 27 31 29   GLUCOSE 131* 85 92  BUN 20 14 11   CREATININE 0.95 0.75 0.59*  CALCIUM  10.0 10.7* 10.1  TSH  --   --  0.65   Liver Function Tests: Recent Labs    12/24/22 1410 07/27/23 1117 11/01/23 1510  AST 26 32 33  ALT 23 32* 31*  BILITOT 0.8 0.7 0.9  PROT 6.7 6.8 6.8   No results for input(s): "LIPASE", "AMYLASE" in the last 8760 hours. No results for input(s): "AMMONIA" in the last 8760 hours. CBC: Recent Labs    03/26/23 1620 07/27/23 1117 11/01/23 1510  WBC 4.2 3.8 3.5*  NEUTROABS  --  1,809 2,058  HGB 11.8 12.9 12.7  HCT 36.2 38.4 38.3  MCV 91 90.1 89.3  PLT 232 234 227   Lipid Panel: Recent Labs    12/24/22 1410 07/27/23 1117 11/01/23 1510  CHOL 195 228* 209*  HDL 107 109 115  LDLCALC 74 103* 79  TRIG 59 74 66  CHOLHDL 1.8 2.1 1.8   Lab Results  Component Value Date   HGBA1C 5.2 12/09/2021    Procedures since last visit: No results found.  Assessment/Plan  1. Fatigue, unspecified type (Primary) -  Unintentional weight loss with associated fatigue, lightheadedness, and  unsteadiness. Normal TSH, elevated B12 possibly from  over-supplementation, low sodium, and previously low vitamin D . Differential includes thyroid  dysfunction, vitamin deficiencies, and systemic issues. - Check vitamin D  and magnesium levels. - Repeat sodium level. - Advise reducing vitamin B12 supplementation. - Start electrolyte supplementation with non-sugary options like Liquid IV. - Magnesium - Basic Metabolic Panel with eGFR  2. Primary hypertension -  Blood pressure well-controlled with atenolol . Advised to monitor during unwell episodes. - Continue atenolol  25 mg daily. - Advise monitoring blood pressure at home, especially during episodes of feeling unwell. - magnesium level - Basic Metabolic Panel with eGFR  3. Vitamin D  deficiency -  continue vitamin D  supplementation - Vitamin D , 25-hydroxy  4. Primary insomnia -  sleeps 6-8 hours/night -  continue Trazodone  50 mg at bedtime   Follow-up Follow-up needed to evaluate lab results and adjust treatment. - Follow up with lab results for vitamin D , magnesium, and sodium. - Consider follow-up appointment based on lab results and symptom progression.     Labs/tests ordered:   magnesium, BMP, vitamin D  level   Return if symptoms worsen or fail to improve.  Meagan Spease Medina-Vargas, NP

## 2023-11-05 NOTE — Telephone Encounter (Signed)
 Noted

## 2023-11-06 ENCOUNTER — Other Ambulatory Visit: Payer: Self-pay | Admitting: Nurse Practitioner

## 2023-11-08 NOTE — Telephone Encounter (Signed)
Pharmacy requested refill. Pended Rx and sent to Jessica for approval due to HIGH ALERT Warning.  

## 2023-11-09 ENCOUNTER — Ambulatory Visit: Payer: Self-pay | Admitting: Adult Health

## 2023-11-09 NOTE — Progress Notes (Signed)
-     Electrolytes, magnesium, vitamin D , all within normal -Calcium  10.5, slightly elevated (normal 8.6-10.4) -  continue current medications, will needs to exercise for at least 150 minutes/week, eat a well balanced diet, keep good hydration

## 2023-11-15 ENCOUNTER — Ambulatory Visit: Payer: Medicare Other | Admitting: Nurse Practitioner

## 2023-11-29 ENCOUNTER — Encounter: Payer: Self-pay | Admitting: Nurse Practitioner

## 2023-11-29 ENCOUNTER — Ambulatory Visit: Admitting: Nurse Practitioner

## 2023-11-29 VITALS — BP 116/78 | HR 60 | Temp 94.9°F | Resp 18 | Ht 63.0 in | Wt 118.8 lb

## 2023-11-29 DIAGNOSIS — R634 Abnormal weight loss: Secondary | ICD-10-CM

## 2023-11-29 DIAGNOSIS — F330 Major depressive disorder, recurrent, mild: Secondary | ICD-10-CM

## 2023-11-29 DIAGNOSIS — R5383 Other fatigue: Secondary | ICD-10-CM | POA: Diagnosis not present

## 2023-11-29 DIAGNOSIS — R197 Diarrhea, unspecified: Secondary | ICD-10-CM | POA: Diagnosis not present

## 2023-11-29 MED ORDER — CITALOPRAM HYDROBROMIDE 10 MG PO TABS: 30.0000 mg | ORAL_TABLET | Freq: Every day | ORAL | 0 refills | Status: DC

## 2023-11-29 NOTE — Progress Notes (Signed)
 Careteam: Patient Care Team: Caro Harlene POUR, NP as PCP - General (Geriatric Medicine) Dann Candyce RAMAN, MD as PCP - Cardiology (Cardiology) Onita Duos, MD as Consulting Physician (Neurology)  PLACE OF SERVICE:  [facility]  Advanced Directive information    Allergies  Allergen Reactions   Codeine     RINGING IN THE EARS.    Other Other (See Comments)    PAIN KILLERS   Oxycodone Itching   Sulfa Antibiotics Other (See Comments)   Ibuprofen Rash    Chief Complaint  Patient presents with   Medical Management of Chronic Issues    4 month follow up    HPI:  Discussed the use of AI scribe software for clinical note transcription with the patient, who gave verbal consent to proceed.  History of Present Illness Mariah Romero is a 73 year old female who presents with unexplained weight loss and fatigue.  She has experienced a weight loss of approximately 15 pounds, initially noticing a loss of appetite. Despite efforts to consume more protein-rich foods and eating three meals a day, including a substantial dinner, she continues to lose weight. She makes herself eat even when not hungry. 1 lb weight loss in the last month.   She describes a significant decrease in energy levels, stating that her energy 'bombed' and she had none. This lack of energy has impacted her daily activities, making her feel incapable of doing tasks she used to manage easily. She has been feeling lightheaded and dizzy, particularly noting an episode while on a boat where she felt nauseous, leading her to rest for the remainder of the day.  She has experienced occasional diarrhea, sometimes with orangey stools, and has been having increased gas. She has a history of chronic diverticulitis and is currently taking probiotics. No abdominal pain or vomiting after eating.  Her past medical history includes mitral valve regurgitation,  with recent evaluation by cardiologist and she has had a colonoscopy  in March 2025. No chest pain, palpitations, or issues with swallowing. She has had a normal mammogram and no vaginal bleeding or blood in the urine.  She is currently taking Celexa  40 mg daily and Trazodone  for sleep. She recently stopped taking vitamin B12 supplements after noticing high levels in her blood tests, which she believes may have contributed to her fatigue. She reports a slight increase in energy after discontinuing B12.  She has a history of caring for her mother, which she describes as consuming her day, and now feels anxious about her current health issues. She denies depression but is grieving her loss of energy. She has a supportive family, including a son who is an Ship broker and a daughter-in-law who is a Health and safety inspector, who have been advising her on dietary changes.    Review of Systems:  Review of Systems  Constitutional:  Positive for malaise/fatigue and weight loss. Negative for chills and fever.  HENT:  Negative for tinnitus.   Respiratory:  Negative for cough, sputum production and shortness of breath.   Cardiovascular:  Negative for chest pain, palpitations and leg swelling.  Gastrointestinal:  Negative for abdominal pain, constipation, diarrhea and heartburn.  Genitourinary:  Negative for dysuria, frequency and urgency.  Musculoskeletal:  Negative for back pain, falls, joint pain and myalgias.  Skin: Negative.   Neurological:  Negative for dizziness and headaches.  Psychiatric/Behavioral:  Negative for depression and memory loss. The patient does not have insomnia.     Past Medical History:  Diagnosis Date  Anxiety and depression    Breast cancer (HCC) 2001   RIGHT SIDE. S/P CHEMOTHERAPY   COVID 11/29/2020   Headache    Mitral valve regurgitation    Osteoporosis    Personal history of chemotherapy    Personal history of radiation therapy    Past Surgical History:  Procedure Laterality Date   APPENDECTOMY     AT AGE 32 YEARS OLD   BREAST LUMPECTOMY  Right 2001   AT AGE 23 YEARS OLD   CHOLECYSTECTOMY     GALLBLADDER SURGERY  2012   TEE WITHOUT CARDIOVERSION N/A 03/29/2023   Procedure: TRANSESOPHAGEAL ECHOCARDIOGRAM;  Surgeon: Mona Vinie BROCKS, MD;  Location: [facility];  Service: Cardiovascular;  Laterality: N/A;   Social History:   reports that she has never smoked. She has never used smokeless tobacco. She reports current alcohol use. She reports that she does not use drugs.  Family History  Problem Relation Age of Onset   Dementia Mother 67   Diabetes Mother    Hypertension Mother    Melanoma Father    Mental illness Father    CVA Father    Breast cancer Sister    Emphysema Paternal Grandmother        never smoker   Asthma Sister        had asthma as a child    Medications: Patient's Medications  New Prescriptions   No medications on file  Previous Medications   ASCORBIC ACID (VITAMIN C ) 500 MG TABLET    Take 500 mg by mouth as needed. Off/on   ASPIRIN  EC 81 MG TABLET    Take 1 tablet (81 mg total) by mouth daily. Swallow whole.   ATENOLOL  (TENORMIN ) 25 MG TABLET    Take 1 tablet (25 mg total) by mouth daily.   ATORVASTATIN  (LIPITOR) 10 MG TABLET    Take 1 tablet (10 mg total) by mouth daily.   B COMPLEX-C (B-COMPLEX WITH VITAMIN C ) TABLET    Take 1 tablet by mouth daily.   BIOTIN 10 MG CAPS    Take by mouth.   CHOLECALCIFEROL (VITAMIN D  INFANT) 10 MCG/ML LIQD    Take 400 Units by mouth daily.   CITALOPRAM  (CELEXA ) 40 MG TABLET    TAKE 1 TABLET BY MOUTH DAILY   COENZYME Q10 300 MG CAPS    Take 1 capsule by mouth daily.   COLLAGENASE POWD    by Does not apply route. Vital Protein Collagen Peptides 2 scoops into coffee daily   CYANOCOBALAMIN (VITAMIN B12) 1000 MCG TABLET    Take 1,000 mcg by mouth daily.   DENTA 5000 PLUS 1.1 % CREA DENTAL CREAM    Take by mouth.   DIAZEPAM  (VALIUM ) 5 MG TABLET    Take 1 tablet (5 mg total) by mouth as needed for anxiety. For dental procedure   FOLIC ACID (FOLVITE) 400 MCG  TABLET    Take 400 mcg by mouth daily.   IBUPROFEN (ADVIL) 800 MG TABLET    Take by mouth as needed.   IPRATROPIUM (ATROVENT ) 0.03 % NASAL SPRAY    Place 2 sprays into both nostrils every 12 (twelve) hours.   MAGNESIUM BISGLYCINATE (MAG GLYCINATE PO)    Take 360 mg by mouth at bedtime.   METHOCARBAMOL  (ROBAXIN ) 500 MG TABLET    Take 1 tablet (500 mg total) by mouth daily as needed for muscle spasms.   MULTIPLE VITAMINS-MINERALS (ZINC PO)    Take 1 capsule by mouth daily.  OMEGA-3 FATTY ACIDS (OMEGA-3 PO)    Take 1 capsule by mouth daily.   ORAL ELECTROLYTES (LIQUID I.V. PO)    Take by mouth as needed (dehydration).   SACCHAROMYCES BOULARDII (FLORASTOR) 250 MG CAPSULE    Take 1 capsule (250 mg total) by mouth 2 (two) times daily.   SPECIALTY VITAMINS PRODUCTS (HAIR NOURISHING SUPPLEMENT PO)    Take by mouth. Nutrafol Brand   TRAZODONE  (DESYREL ) 50 MG TABLET    TAKE 1 TABLET BY MOUTH AT BEDTIME   WHEAT DEXTRIN (BENEFIBER DRINK MIX PO)    Take 1 Package by mouth daily.  Modified Medications   No medications on file  Discontinued Medications   No medications on file    Physical Exam:  Vitals:   11/29/23 1336  BP: 126/78  Pulse: 61  Resp: 18  Temp: (!) 94.9 F (34.9 C)  SpO2: 98%  Weight: 118 lb 12.8 oz (53.9 kg)  Height: 5' 3 (1.6 m)   Body mass index is 21.04 kg/m. Wt Readings from Last 3 Encounters:  11/29/23 118 lb 12.8 oz (53.9 kg)  11/05/23 119 lb 3.2 oz (54.1 kg)  11/01/23 119 lb (54 kg)    Physical Exam Constitutional:      General: She is not in acute distress.    Appearance: She is well-developed. She is not diaphoretic.  HENT:     Head: Normocephalic and atraumatic.     Mouth/Throat:     Pharynx: No oropharyngeal exudate.   Eyes:     Conjunctiva/sclera: Conjunctivae normal.     Pupils: Pupils are equal, round, and reactive to light.    Cardiovascular:     Rate and Rhythm: Normal rate and regular rhythm.     Heart sounds: Normal heart sounds.  Pulmonary:      Effort: Pulmonary effort is normal.     Breath sounds: Normal breath sounds.  Abdominal:     General: Bowel sounds are normal.     Palpations: Abdomen is soft.   Musculoskeletal:     Cervical back: Normal range of motion and neck supple.     Right lower leg: No edema.     Left lower leg: No edema.   Skin:    General: Skin is warm and dry.   Neurological:     Mental Status: She is alert.   Psychiatric:        Mood and Affect: Mood normal.     Labs reviewed: Basic Metabolic Panel: Recent Labs    07/27/23 1117 11/01/23 1510 11/05/23 1417  NA 136 133* 135  K 4.2 4.0 4.0  CL 99 96* 98  CO2 31 29 29   GLUCOSE 85 92 110*  BUN 14 11 10   CREATININE 0.75 0.59* 0.67  CALCIUM  10.7* 10.1 10.5*  MG  --   --  2.1  TSH  --  0.65  --    Liver Function Tests: Recent Labs    12/24/22 1410 07/27/23 1117 11/01/23 1510  AST 26 32 33  ALT 23 32* 31*  BILITOT 0.8 0.7 0.9  PROT 6.7 6.8 6.8   No results for input(s): LIPASE, AMYLASE in the last 8760 hours. No results for input(s): AMMONIA in the last 8760 hours. CBC: Recent Labs    03/26/23 1620 07/27/23 1117 11/01/23 1510  WBC 4.2 3.8 3.5*  NEUTROABS  --  1,809 2,058  HGB 11.8 12.9 12.7  HCT 36.2 38.4 38.3  MCV 91 90.1 89.3  PLT 232 234 227   Lipid Panel:  Recent Labs    12/24/22 1410 07/27/23 1117 11/01/23 1510  CHOL 195 228* 209*  HDL 107 109 115  LDLCALC 74 103* 79  TRIG 59 74 66  CHOLHDL 1.8 2.1 1.8   TSH: Recent Labs    11/01/23 1510  TSH 0.65   A1C: Lab Results  Component Value Date   HGBA1C 5.2 12/09/2021     Assessment/Plan Assessment and Plan Assessment & Plan Unintentional Weight Loss and Fatigue Unintentional weight loss and fatigue with decreased appetite. Differential includes nutritional deficiencies, endocrine disorders, or gastrointestinal issues such as EPI. - Order pancreatic elastase test for EPI due to diarrhea - Check electrolytes, kidney function, and liver  function with a complete metabolic panel. - Follow up in six weeks to reassess symptoms and test results.  Depression Desires to wean off citalopram . Discussed gradual reduction plan to minimize withdrawal effects and monitor mood and energy levels. - Prescribe 10 mg citalopram  tablets for gradual reduction. - Reduce citalopram  to 30 mg daily for one week, then consider reduction to 20 mg daily.  Vitamin B12 Supplementation Previously elevated B12 levels after supplementation. Discontinued supplement with slight increase in energy. - Monitor energy levels and dietary intake without B12 supplementation.  General Health Maintenance Up to date on mammogram and colonoscopy. Mitral valve regurgitation requires cardiology follow-up. Discussed hormone replacement therapy risks. - Consider gynecologist referral for hormone evaluation if interested. - Encourage regular cardiologist follow-up for mitral valve regurgitation.   Return in about 6 weeks (around 01/10/2024) for weight, mood, fatigue .:  Shanon Seawright K. Caro BODILY [facility] [phone]

## 2023-11-30 ENCOUNTER — Ambulatory Visit: Payer: Self-pay | Admitting: Nurse Practitioner

## 2023-11-30 DIAGNOSIS — R197 Diarrhea, unspecified: Secondary | ICD-10-CM | POA: Diagnosis not present

## 2023-11-30 LAB — COMPREHENSIVE METABOLIC PANEL WITH GFR
AG Ratio: 2.1 (calc) (ref 1.0–2.5)
ALT: 31 U/L — ABNORMAL HIGH (ref 6–29)
AST: 32 U/L (ref 10–35)
Albumin: 4.4 g/dL (ref 3.6–5.1)
Alkaline phosphatase (APISO): 45 U/L (ref 37–153)
BUN: 10 mg/dL (ref 7–25)
CO2: 31 mmol/L (ref 20–32)
Calcium: 9.7 mg/dL (ref 8.6–10.4)
Chloride: 98 mmol/L (ref 98–110)
Creat: 0.61 mg/dL (ref 0.60–1.00)
Globulin: 2.1 g/dL (ref 1.9–3.7)
Glucose, Bld: 100 mg/dL — ABNORMAL HIGH (ref 65–99)
Potassium: 3.8 mmol/L (ref 3.5–5.3)
Sodium: 134 mmol/L — ABNORMAL LOW (ref 135–146)
Total Bilirubin: 0.9 mg/dL (ref 0.2–1.2)
Total Protein: 6.5 g/dL (ref 6.1–8.1)
eGFR: 95 mL/min/{1.73_m2} (ref >=60)

## 2023-12-07 LAB — PANCREATIC ELASTASE, FECAL: Pancreatic Elastase-1, Stool: >800 ug/g (ref >200)

## 2023-12-11 ENCOUNTER — Other Ambulatory Visit: Payer: Self-pay | Admitting: Nurse Practitioner

## 2023-12-11 DIAGNOSIS — J310 Chronic rhinitis: Secondary | ICD-10-CM

## 2023-12-15 DIAGNOSIS — M791 Myalgia, unspecified site: Secondary | ICD-10-CM | POA: Diagnosis not present

## 2023-12-15 DIAGNOSIS — M545 Low back pain, unspecified: Secondary | ICD-10-CM | POA: Diagnosis not present

## 2023-12-20 ENCOUNTER — Encounter: Payer: Medicare Other | Admitting: Nurse Practitioner

## 2024-01-06 ENCOUNTER — Other Ambulatory Visit: Payer: Self-pay | Admitting: Nurse Practitioner

## 2024-01-06 DIAGNOSIS — F330 Major depressive disorder, recurrent, mild: Secondary | ICD-10-CM

## 2024-01-06 NOTE — Telephone Encounter (Signed)
Pharmacy requested refill. Pended Rx and sent to Jessica for approval due to HIGH ALERT Warning.  

## 2024-01-21 ENCOUNTER — Ambulatory Visit (INDEPENDENT_AMBULATORY_CARE_PROVIDER_SITE_OTHER): Admitting: Nurse Practitioner

## 2024-01-21 ENCOUNTER — Encounter: Payer: Self-pay | Admitting: Nurse Practitioner

## 2024-01-21 VITALS — BP 128/78 | HR 75 | Temp 97.5°F | Ht 63.0 in | Wt 112.8 lb

## 2024-01-21 DIAGNOSIS — R634 Abnormal weight loss: Secondary | ICD-10-CM | POA: Diagnosis not present

## 2024-01-21 DIAGNOSIS — I340 Nonrheumatic mitral (valve) insufficiency: Secondary | ICD-10-CM

## 2024-01-21 DIAGNOSIS — F5101 Primary insomnia: Secondary | ICD-10-CM | POA: Diagnosis not present

## 2024-01-21 DIAGNOSIS — R002 Palpitations: Secondary | ICD-10-CM

## 2024-01-21 MED ORDER — MIRTAZAPINE 7.5 MG PO TABS: 7.5000 mg | ORAL_TABLET | Freq: Every day | ORAL | 1 refills | Status: DC

## 2024-01-21 MED ORDER — TRAZODONE HCL 50 MG PO TABS: 50.0000 mg | ORAL_TABLET | Freq: Every day | ORAL | Status: AC

## 2024-01-21 NOTE — Progress Notes (Signed)
 Careteam: Patient Care Team: Caro Harlene POUR, NP as PCP - General (Geriatric Medicine) Dann Candyce RAMAN, MD as PCP - Cardiology (Cardiology) Onita Duos, MD as Consulting Physician (Neurology)  PLACE OF SERVICE:  [facility]  Advanced Directive information    Allergies  Allergen Reactions   Codeine     RINGING IN THE EARS.    Other Other (See Comments)    PAIN KILLERS   Oxycodone Itching   Sulfa Antibiotics Other (See Comments)   Ibuprofen Rash    Chief Complaint  Patient presents with   Follow-up    6 week follow up Patient has concerns about Bp being low, continue to lose weight without trying. Trimmers, very weak no energy Discussed the need for AWV    HPI:  Discussed the use of AI scribe software for clinical note transcription with the patient, who gave verbal consent to proceed.  History of Present Illness Mariah Romero is a 73 year old female who presents with ongoing weight loss.  She is experiencing ongoing weight loss despite efforts to maintain caloric intake. Eating has become a chore, and she lacks enjoyment in meals. She can consume certain foods like bacon sandwiches and fried egg sandwiches on sourdough bread. She strives to eat three meals a day, incorporating protein-rich foods such as hamburgers, chicken, and yogurt with protein powder. Despite these efforts, her weight has decreased from 110 pounds a week ago to 109 pounds today.  She experiences fluctuations in energy levels, often feeling lightheaded and weak. A particularly severe episode of weakness and debilitation lasted eight hours after a day out. Thankfully this has not reoccurred since this event which was prior to last visit. No recent diarrhea, but she reports occasional nausea. Her appetite has decreased, and she notes a change in taste preferences, with some foods and drinks, including wine, no longer tasting good. She consumes a half glass of wine daily but often does not finish  it.  She has been working with a nutritionist since experiencing vertigo. She reports a history of irregular heartbeats, although she does not feel palpitations. She is currently taking atenolol  25 mg daily for palpitations and blood pressure   continues on Celexa , as she feels she needs to be stronger before weaning off.   Her past medical history includes breast cancer treated with lumpectomy, chemotherapy, and radiation in 2001. She maintains regular mammograms as advised. She also has a history of colonoscopy with removal of two precancerous polyps, and she reports no significant changes in appetite at that time.  Her social history includes a daily intake of wine, and she has a son who is currently in prison, which she identifies as a significant source of stress. She reports that her son has been diagnosed with mental health conditions, including paranoid schizophrenia and bipolar disorder, and she maintains contact with him.  She reports she would like to have thyroid  panel completed even if she has to pay out of pocket.  TSH T3 T4 fT3 fT4 rT3 Anti-TPO Anti-Tg  Review of Systems:  Review of Systems  Constitutional:  Positive for malaise/fatigue and weight loss. Negative for chills and fever.  HENT:  Negative for tinnitus.   Respiratory:  Negative for cough, sputum production and shortness of breath.   Cardiovascular:  Negative for chest pain, palpitations and leg swelling.  Gastrointestinal:  Negative for abdominal pain, constipation, diarrhea and heartburn.  Genitourinary:  Negative for dysuria, frequency and urgency.  Musculoskeletal:  Negative for back pain,  falls, joint pain and myalgias.  Skin: Negative.   Neurological:  Negative for dizziness and headaches.  Psychiatric/Behavioral:  Negative for depression and memory loss. The patient does not have insomnia.     Past Medical History:  Diagnosis Date   Anxiety and depression    Breast cancer (HCC) 2001   RIGHT SIDE.  S/P CHEMOTHERAPY   COVID 11/29/2020   Headache    Mitral valve regurgitation    Osteoporosis    Personal history of chemotherapy    Personal history of radiation therapy    Past Surgical History:  Procedure Laterality Date   APPENDECTOMY     AT AGE 35 YEARS OLD   BREAST LUMPECTOMY Right 2001   AT AGE 80 YEARS OLD   CHOLECYSTECTOMY     GALLBLADDER SURGERY  2012   TEE WITHOUT CARDIOVERSION N/A 03/29/2023   Procedure: TRANSESOPHAGEAL ECHOCARDIOGRAM;  Surgeon: Mona Vinie BROCKS, MD;  Location: [facility];  Service: Cardiovascular;  Laterality: N/A;   Social History:   reports that she has never smoked. She has never used smokeless tobacco. She reports current alcohol use. She reports that she does not use drugs.  Family History  Problem Relation Age of Onset   Dementia Mother 19   Diabetes Mother    Hypertension Mother    Melanoma Father    Mental illness Father    CVA Father    Breast cancer Sister    Emphysema Paternal Grandmother        never smoker   Asthma Sister        had asthma as a child    Medications: Patient's Medications  New Prescriptions   No medications on file  Previous Medications   ASCORBIC ACID (VITAMIN C ) 500 MG TABLET    Take 500 mg by mouth as needed. Off/on   ASPIRIN  EC 81 MG TABLET    Take 1 tablet (81 mg total) by mouth daily. Swallow whole.   ATENOLOL  (TENORMIN ) 25 MG TABLET    Take 1 tablet (25 mg total) by mouth daily.   ATORVASTATIN  (LIPITOR) 10 MG TABLET    Take 1 tablet (10 mg total) by mouth daily.   B COMPLEX-C (B-COMPLEX WITH VITAMIN C ) TABLET    Take 1 tablet by mouth daily.   BIOTIN 10 MG CAPS    Take by mouth.   CHOLECALCIFEROL (VITAMIN D  INFANT) 10 MCG/ML LIQD    Take 400 Units by mouth daily.   CITALOPRAM  (CELEXA ) 10 MG TABLET    TAKE 3 TABLETS BY MOUTH DAILY   COENZYME Q10 300 MG CAPS    Take 1 capsule by mouth daily.   COLLAGENASE POWD    by Does not apply route. Vital Protein Collagen Peptides 2 scoops into coffee daily    DENTA 5000 PLUS 1.1 % CREA DENTAL CREAM    Take by mouth.   DIAZEPAM  (VALIUM ) 5 MG TABLET    Take 1 tablet (5 mg total) by mouth as needed for anxiety. For dental procedure   IBUPROFEN (ADVIL) 800 MG TABLET    Take by mouth as needed.   IPRATROPIUM (ATROVENT ) 0.03 % NASAL SPRAY    PLACE 2 SPRAYS INTO BOTH NOSTRILS EVERY 12 HOURS   MAGNESIUM BISGLYCINATE (MAG GLYCINATE PO)    Take 360 mg by mouth at bedtime.   METHOCARBAMOL  (ROBAXIN ) 500 MG TABLET    Take 1 tablet (500 mg total) by mouth daily as needed for muscle spasms.   MULTIPLE VITAMINS-MINERALS (ZINC PO)  Take 1 capsule by mouth daily.   OMEGA-3 FATTY ACIDS (OMEGA-3 PO)    Take 1 capsule by mouth daily.   ORAL ELECTROLYTES (LIQUID I.V. PO)    Take by mouth as needed (dehydration).   SACCHAROMYCES BOULARDII (FLORASTOR) 250 MG CAPSULE    Take 1 capsule (250 mg total) by mouth 2 (two) times daily.   SPECIALTY VITAMINS PRODUCTS (HAIR NOURISHING SUPPLEMENT PO)    Take by mouth. Nutrafol Brand   TRAZODONE  (DESYREL ) 50 MG TABLET    TAKE 1 TABLET BY MOUTH AT BEDTIME   WHEAT DEXTRIN (BENEFIBER DRINK MIX PO)    Take 1 Package by mouth daily.  Modified Medications   No medications on file  Discontinued Medications   No medications on file    Physical Exam:  Vitals:   01/21/24 1348  BP: 128/78  Pulse: 75  Temp: (!) 97.5 F (36.4 C)  SpO2: 98%  Weight: 112 lb 12.8 oz (51.2 kg)  Height: 5' 3 (1.6 m)   Body mass index is 19.98 kg/m. Wt Readings from Last 3 Encounters:  01/21/24 112 lb 12.8 oz (51.2 kg)  11/29/23 118 lb 12.8 oz (53.9 kg)  11/05/23 119 lb 3.2 oz (54.1 kg)    Physical Exam Constitutional:      General: She is not in acute distress.    Appearance: She is well-developed. She is not diaphoretic.  HENT:     Head: Normocephalic and atraumatic.     Mouth/Throat:     Pharynx: No oropharyngeal exudate.  Eyes:     Conjunctiva/sclera: Conjunctivae normal.     Pupils: Pupils are equal, round, and reactive to light.   Cardiovascular:     Rate and Rhythm: Normal rate and regular rhythm.     Heart sounds: Normal heart sounds.  Pulmonary:     Effort: Pulmonary effort is normal.     Breath sounds: Normal breath sounds.  Abdominal:     General: Bowel sounds are normal.     Palpations: Abdomen is soft.  Musculoskeletal:     Cervical back: Normal range of motion and neck supple.     Right lower leg: No edema.     Left lower leg: No edema.  Skin:    General: Skin is warm and dry.  Neurological:     Mental Status: She is alert and oriented to person, place, and time.     Motor: Weakness present.  Psychiatric:        Mood and Affect: Mood normal.     Labs reviewed: Basic Metabolic Panel: Recent Labs    11/01/23 1510 11/05/23 1417 11/29/23 1442  NA 133* 135 134*  K 4.0 4.0 3.8  CL 96* 98 98  CO2 29 29 31   GLUCOSE 92 110* 100*  BUN 11 10 10   CREATININE 0.59* 0.67 0.61  CALCIUM  10.1 10.5* 9.7  MG  --  2.1  --   TSH 0.65  --   --    Liver Function Tests: Recent Labs    07/27/23 1117 11/01/23 1510 11/29/23 1442  AST 32 33 32  ALT 32* 31* 31*  BILITOT 0.7 0.9 0.9  PROT 6.8 6.8 6.5   No results for input(s): LIPASE, AMYLASE in the last 8760 hours. No results for input(s): AMMONIA in the last 8760 hours. CBC: Recent Labs    03/26/23 1620 07/27/23 1117 11/01/23 1510  WBC 4.2 3.8 3.5*  NEUTROABS  --  1,809 2,058  HGB 11.8 12.9 12.7  HCT 36.2 38.4 38.3  MCV 91 90.1 89.3  PLT 232 234 227   Lipid Panel: Recent Labs    07/27/23 1117 11/01/23 1510  CHOL 228* 209*  HDL 109 115  LDLCALC 103* 79  TRIG 74 66  CHOLHDL 2.1 1.8   TSH: Recent Labs    11/01/23 1510  TSH 0.65   A1C: Lab Results  Component Value Date   HGBA1C 5.2 12/09/2021     Assessment/Plan  Assessment & Plan Unintentional weight loss with decreased appetite and fatigue Continued weight loss despite increased caloric intake. Decreased appetite and fatigue persist. Hesitant to add new  medication. - Discussed potential use of Remeron  for appetite stimulation - Follow up in 3 months or sooner if symptoms worsen. TSH normal but she request thyroid  panel- Will follow up thyroid  panel per request- if normal would like her to follow up with GI again.   Palpitations   Managed with atenolol  25 mg daily. Interested in consulting with Dr. Ladona. - Refer to Dr. Ladona for cardiology evaluation. - Continue atenolol  25 mg daily.  Depression Continues to experience symptoms of frustration and low appetite despite Celexa  and trazodone . Discussed potential switch from trazodone  to Remeron  for appetite and sleep issues. Prefers to research Remeron  before deciding. - Continue Celexa . - Discuss potential switch from trazodone  to Remeron .  History of breast cancer, status post lumpectomy, chemotherapy, and radiation Treated with lumpectomy, chemotherapy, and radiation in 2001. Up to date with annual mammograms. - Ensure annual mammograms are up to date.   Return in about 3 months (around 04/22/2024) for routine follow up.  Quinisha Mould K. Caro BODILY [place] [facility] [phone]

## 2024-01-21 NOTE — Patient Instructions (Addendum)
 1.) Schedule annual wellness visit at checkout   Get back to me on the thyroid  labs and starting remeron  to help with appetite.

## 2024-01-25 ENCOUNTER — Encounter: Payer: Self-pay | Admitting: Nurse Practitioner

## 2024-01-25 ENCOUNTER — Other Ambulatory Visit: Payer: Self-pay | Admitting: Nurse Practitioner

## 2024-01-25 DIAGNOSIS — F330 Major depressive disorder, recurrent, mild: Secondary | ICD-10-CM

## 2024-01-25 DIAGNOSIS — R634 Abnormal weight loss: Secondary | ICD-10-CM

## 2024-02-01 ENCOUNTER — Other Ambulatory Visit: Payer: Self-pay | Admitting: Nurse Practitioner

## 2024-02-01 DIAGNOSIS — K219 Gastro-esophageal reflux disease without esophagitis: Secondary | ICD-10-CM

## 2024-02-03 ENCOUNTER — Telehealth: Payer: Self-pay

## 2024-02-03 ENCOUNTER — Other Ambulatory Visit

## 2024-02-03 ENCOUNTER — Telehealth: Payer: Self-pay | Admitting: Cardiology

## 2024-02-03 NOTE — Telephone Encounter (Signed)
 Patient called and notified per PCP Caro Harlene POUR, NP she doesn't need to be fasting for upcoming labs.

## 2024-02-03 NOTE — Telephone Encounter (Signed)
 I am okay with this. I am happy to see her. I did review Dr. Casimiro note and fortunately, she is doing very well and may not even need long term follow up. Just FYI so she can continue to see Dr. Jeffrie or see me.

## 2024-02-03 NOTE — Telephone Encounter (Signed)
 Patient would like to do a Provider switch from Dr Jeffrie to Dr Ladona. She said she use to see Dr Ladona years ago and found out he was here now.Please advise

## 2024-02-03 NOTE — Telephone Encounter (Signed)
 Copied from CRM [id number]. Topic: Clinical - Medical Advice >> Feb 02, 2024  4:40 PM Mercer PEDLAR wrote: Reason for CRM: Patient would like a callback to let her know if she needs to be fasting for labs scheduled for 02/03/24 at 1:45pm.

## 2024-02-04 ENCOUNTER — Other Ambulatory Visit: Payer: Self-pay | Admitting: Nurse Practitioner

## 2024-02-04 ENCOUNTER — Other Ambulatory Visit (HOSPITAL_BASED_OUTPATIENT_CLINIC_OR_DEPARTMENT_OTHER): Payer: Self-pay | Admitting: Cardiology

## 2024-02-04 DIAGNOSIS — F330 Major depressive disorder, recurrent, mild: Secondary | ICD-10-CM

## 2024-02-07 ENCOUNTER — Ambulatory Visit: Payer: Self-pay | Admitting: Nurse Practitioner

## 2024-02-07 LAB — T4, FREE: Free T4: 1.2 ng/dL (ref 0.8–1.8)

## 2024-02-07 LAB — T3, FREE: T3, Free: 3 pg/mL (ref 2.3–4.2)

## 2024-02-07 LAB — T4: T4, Total: 6.1 ug/dL (ref 5.1–11.9)

## 2024-02-07 LAB — T3: T3, Total: 81 ng/dL (ref 76–181)

## 2024-02-07 LAB — THYROID PEROXIDASE ANTIBODIES (TPO) (REFL): Thyroperoxidase Ab SerPl-aCnc: 3 [IU]/mL (ref <9)

## 2024-02-07 LAB — TSH: TSH: 0.75 m[IU]/L (ref 0.40–4.50)

## 2024-02-07 LAB — T3, REVERSE: T3, Reverse: 11 ng/dL (ref 8–25)

## 2024-02-07 LAB — THYROGLOBULIN ANTIBODY: Thyroglobulin Ab: <1 [IU]/mL (ref <=1)

## 2024-02-11 ENCOUNTER — Other Ambulatory Visit: Payer: Self-pay | Admitting: Nurse Practitioner

## 2024-02-11 DIAGNOSIS — F330 Major depressive disorder, recurrent, mild: Secondary | ICD-10-CM

## 2024-02-11 MED ORDER — CITALOPRAM HYDROBROMIDE 10 MG PO TABS: 30.0000 mg | ORAL_TABLET | Freq: Every day | ORAL | 0 refills | Status: DC

## 2024-02-11 NOTE — Telephone Encounter (Signed)
 Copied from CRM #[id number]. Topic: Clinical - Medication Refill >> Feb 11, 2024  1:17 PM Vivian Z wrote: Medication: citalopram  (CELEXA ) 40 MG tablet  Patient stated that she discussed the dosage of medication with provider. She is requesting refill for the 40 MG.   Has the patient contacted their pharmacy? Yes (Agent: If no, request that the patient contact the pharmacy for the refill. If patient does not wish to contact the pharmacy document the reason why and proceed with request.) (Agent: If yes, when and what did the pharmacy advise?)  This is the patient's preferred pharmacy:   [facility] [id number] [place], [place] - [address] [place] [address] Phone: [phone] Fax: [phone]  Is this the correct pharmacy for this prescription? Yes If no, delete pharmacy and type the correct one.   Has the prescription been filled recently? No  Is the patient out of the medication? No  Has the patient been seen for an appointment in the last year OR does the patient have an upcoming appointment? Yes  Can we respond through MyChart? No  Agent: Please be advised that Rx refills may take up to 3 business days. We ask that you follow-up with your pharmacy.

## 2024-02-11 NOTE — Telephone Encounter (Signed)
 Medication: citalopram  (CELEXA ) 40 MG tablet   Patient stated that she discussed the dosage of medication with provider. She is requesting refill for the 40 MG.    Please Advise.

## 2024-02-15 ENCOUNTER — Other Ambulatory Visit: Payer: Self-pay | Admitting: Nurse Practitioner

## 2024-02-15 DIAGNOSIS — F330 Major depressive disorder, recurrent, mild: Secondary | ICD-10-CM

## 2024-02-17 ENCOUNTER — Other Ambulatory Visit: Payer: Self-pay | Admitting: Nurse Practitioner

## 2024-02-17 DIAGNOSIS — F330 Major depressive disorder, recurrent, mild: Secondary | ICD-10-CM

## 2024-02-18 ENCOUNTER — Ambulatory Visit: Payer: Self-pay

## 2024-02-18 ENCOUNTER — Other Ambulatory Visit: Payer: Self-pay | Admitting: *Deleted

## 2024-02-18 MED ORDER — CITALOPRAM HYDROBROMIDE 40 MG PO TABS: 40.0000 mg | ORAL_TABLET | Freq: Every day | ORAL | 1 refills | Status: AC

## 2024-02-18 NOTE — Telephone Encounter (Signed)
 Sent Rx in for 40 mg

## 2024-02-18 NOTE — Telephone Encounter (Signed)
 Updated current medication list and Pended Rx for Jessica's approval.

## 2024-02-18 NOTE — Telephone Encounter (Signed)
  FYI Only or Action Required?: Action required by provider: clinical question for provider, update on patient condition, and 40 mg refill of citalopram .  Patient was last seen in primary care on 01/21/2024 by Caro Harlene POUR, NP.  Called Nurse Triage reporting Dizziness.  Symptoms began several months ago.  Interventions attempted: Prescription medications: citalopram .  Symptoms are: unchanged.  Triage Disposition: Home Care  Patient/caregiver understands and will follow disposition?: Yes Copied from CRM [id number]. Topic: Clinical - Red Word Triage >> Feb 18, 2024 11:52 AM Alfonso ORN wrote: Red Word that prompted transfer to Nurse Triage:  extreme fatigue , lightheadness, no appettie , losing weight went from 130 down to 110 lbs  7 months into the extreme fatigue  Patient originally call regarding the medication issue   (Pt have been on the Citalopram  40 mg , and her provider Harlene Caro want to  wean pt. off the medication  to a 10 mg , pt. Stated she is not ready to be weaned off until she feel better ) Reason for Disposition  [1] Depression AND [2] NO worsening or change  Answer Assessment - Initial Assessment Questions 1. DESCRIPTION: Describe your dizziness.     Not dizzy, light headed 2. LIGHTHEADED: Do you feel lightheaded? (e.g., somewhat faint, woozy, weak upon standing)     Light headedness that has improved over the past two weeks, she states that she is not experiencing dizziness at this time.  Answer Assessment - Initial Assessment Questions 1. CONCERN: What happened that made you call today?     Concern for citalopram  dosage lowering. Wants to stay at 40 mg dosage 2. DEPRESSION SYMPTOM SCREENING: How are you feeling overall? (e.g., decreased energy, increased sleeping or difficulty sleeping, difficulty concentrating, feelings of sadness, guilt, hopelessness, or worthlessness)     Low energy, fatigue, loss of appetite, loss of motivation to complete tasks,  weight loss 5. FUNCTIONAL IMPAIRMENT: How have things been going for you overall? Have you had more difficulty than usual doing your normal daily activities?  (e.g., better, same, worse; self-care, school, work, interactions)     Low energy, states that she only does essential activities.  Sometimes too tired to pay her bills  Patient does not want to wean off of citalopram .  Feel like her symptoms are not symptoms of depression, but that she is depressed because of the symptoms.  She does not want to try another medication  Patient prefers to use [facility] and would like her refill 40 mg called into this location  Protocols used: Dizziness - Lightheadedness-A-AH, Depression-A-AH

## 2024-02-18 NOTE — Telephone Encounter (Signed)
 Copied from CRM #[id number]. Topic: Clinical - Prescription Issue >> Feb 17, 2024  4:55 PM Corin V wrote: Reason for CRM: Patient called in stated she had requested that the Citalopram  be changed to 40mg  daily and it was still sent in as 30mg  daily with the 10mg  tablets. She stated she discussed this with Harlene and would like this changed per Jessica's approval in the last visit. Please contact at [phone] when this is approved.

## 2024-02-18 NOTE — Telephone Encounter (Signed)
 Patient notified and agreed.

## 2024-02-23 DIAGNOSIS — R5383 Other fatigue: Secondary | ICD-10-CM | POA: Diagnosis not present

## 2024-02-23 DIAGNOSIS — E559 Vitamin D deficiency, unspecified: Secondary | ICD-10-CM | POA: Diagnosis not present

## 2024-03-03 ENCOUNTER — Encounter: Payer: Self-pay | Admitting: Cardiology

## 2024-03-03 ENCOUNTER — Ambulatory Visit: Attending: Cardiology | Admitting: Cardiology

## 2024-03-03 ENCOUNTER — Other Ambulatory Visit: Payer: Self-pay | Admitting: Cardiology

## 2024-03-03 VITALS — BP 118/76 | HR 59 | Resp 16 | Ht 63.0 in | Wt 115.8 lb

## 2024-03-03 DIAGNOSIS — R5381 Other malaise: Secondary | ICD-10-CM

## 2024-03-03 DIAGNOSIS — R002 Palpitations: Secondary | ICD-10-CM

## 2024-03-03 DIAGNOSIS — R630 Anorexia: Secondary | ICD-10-CM | POA: Diagnosis not present

## 2024-03-03 DIAGNOSIS — I341 Nonrheumatic mitral (valve) prolapse: Secondary | ICD-10-CM | POA: Diagnosis not present

## 2024-03-03 DIAGNOSIS — R5383 Other fatigue: Secondary | ICD-10-CM

## 2024-03-03 MED ORDER — ATENOLOL 25 MG PO TABS: 25.0000 mg | ORAL_TABLET | Freq: Every day | ORAL | 3 refills | Status: AC

## 2024-03-03 NOTE — Patient Instructions (Signed)
 Medication Instructions:  Your physician recommends that you continue on your current medications as directed. Please refer to the Current Medication list given to you today.  *If you need a refill on your cardiac medications before your next appointment, please call your pharmacy*  Lab Work: None ordered.  If you have labs (blood work) drawn today and your tests are completely normal, you will receive your results only by: MyChart Message (if you have MyChart) OR A paper copy in the mail If you have any lab test that is abnormal or we need to change your treatment, we will call you to review the results.  Testing/Procedures: None ordered.   Follow-Up: At [facility], you and your health needs are our priority.  As part of our continuing mission to provide you with exceptional heart care, our providers are all part of one team.  This team includes your primary Cardiologist (physician) and Advanced Practice Providers or APPs (Physician Assistants and Nurse Practitioners) who all work together to provide you with the care you need, when you need it.  Your next appointment:   Follow up as needed with Dr Ladona

## 2024-03-03 NOTE — Progress Notes (Signed)
 Cardiology Office Note:  .   Date:  03/03/2024  ID:  Mariah Romero, DOB 01/27/1951, MRN [id number] PCP: Delayne Artist PARAS, MD  [facility] Providers Cardiologist:  None   History of Present Illness: .   Mariah Romero is a 73 y.o. Caucasian female patient with chronic palpitations presently on low-dose of beta-blocker therapy, echocardiogram in September 2024 had revealed moderately severe mitral regurgitation for which he underwent TEE on 03/29/2023 this revealing only mild to moderate mitral regurgitation secondary to MVP and trace aortic regurgitation.  Medical therapy was recommended.    She also has coronary calcium  score of 0 on 03/26/2022.  She normally follows Dr. Oneil Parchment, wants to switch to me.   He main complaint is fatigue and malaise and palpitations.  Cardiac Studies relevent.    ECHOCARDIOGRAM COMPLETE 03/15/2023  1. Left ventricular ejection fraction, by estimation, is 60 to 65%. The left ventricle has normal function. The left ventricle has no regional wall motion abnormalities. Left ventricular diastolic parameters are consistent with Grade I diastolic dysfunction (impaired relaxation). 2. Right ventricular systolic function is normal. The right ventricular size is normal. 3. Left atrial size was mildly dilated. 4. The mitral valve is abnormal. Moderate to severe mitral valve regurgitation. No evidence of mitral stenosis. 5. The aortic valve is tricuspid. Aortic valve regurgitation is mild. No aortic stenosis is present. 6. Multiple jets of pulmonic regurgitation.  ECHO TEE 03/29/2023  1. Left ventricular ejection fraction, by estimation, is 60 to 65%. The left ventricle has normal function. There is mild left ventricular hypertrophy. 2. Right ventricular systolic function is normal. The right ventricular size is normal. 3. Left atrial size was mildly dilated. No left atrial/left atrial appendage thrombus was detected. 4. Mild late systolic bowing of the  AMVL. The mitral valve is abnormal. Mild to moderate mitral valve regurgitation. 5. The aortic valve is tricuspid. Aortic valve regurgitation is trivial.    Discussed the use of AI scribe software for clinical note transcription with the patient, who gave verbal consent to proceed.  History of Present Illness Mariah Romero is a 73 year old female with mitral valve prolapse who presents with chronic palpitations and fatigue. She was referred by Dr. Artist Delayne for evaluation of her cardiac symptoms.  She has mitral valve prolapse and experiences chronic palpitations, managed with atenolol  25 mg at night. An echocardiogram in September 2024 showed a severe mitral valve leak, while a TEE on March 29, 2023, indicated mild to moderate leakage. Palpitations and heart racing occur, especially at rest or when lying on her left side.  She experiences extreme fatigue since early last year, worsening over time. Initially postprandial, it is now accompanied by a loss of appetite and weight loss from 130 to 111-112 pounds. Lightheadedness occurs after eating or showering, sometimes requiring assistance due to instability.  Her sleep is disrupted by insomnia, and she uses trazodone  to aid sleep. She wakes feeling unrefreshed and only feels energetic in the late afternoon. She monitors her blood pressure, noting it is sometimes low, particularly after eating or showering, which she attributes to atenolol  use. She also takes atorvastatin  and citalopram .  Labs   Lab Results  Component Value Date   CHOL 209 (H) 11/01/2023   HDL 115 11/01/2023   LDLCALC 79 11/01/2023   TRIG 66 11/01/2023   CHOLHDL 1.8 11/01/2023   No results found for: LIPOA  Recent Labs    11/01/23 1510 11/05/23 1417 11/29/23 1442  NA 133*  135 134*  K 4.0 4.0 3.8  CL 96* 98 98  CO2 29 29 31   GLUCOSE 92 110* 100*  BUN 11 10 10   CREATININE 0.59* 0.67 0.61  CALCIUM  10.1 10.5* 9.7    Lab Results  Component Value Date   ALT  31 (H) 11/29/2023   AST 32 11/29/2023   ALKPHOS 58 03/27/2021   BILITOT 0.9 11/29/2023      Latest Ref Rng & Units 11/01/2023    3:10 PM 07/27/2023   11:17 AM 03/26/2023    4:20 PM  CBC  WBC 3.8 - 10.8 Thousand/uL 3.5  3.8  4.2   Hemoglobin 11.7 - 15.5 g/dL 87.2  87.0  88.1   Hematocrit 35.0 - 45.0 % 38.3  38.4  36.2   Platelets 140 - 400 Thousand/uL 227  234  232    Lab Results  Component Value Date   HGBA1C 5.2 12/09/2021    Lab Results  Component Value Date   TSH 0.75 02/03/2024     ROS  Review of Systems  Constitutional: Positive for malaise/fatigue and weight loss.  Cardiovascular:  Positive for palpitations. Negative for chest pain, dyspnea on exertion and leg swelling.   Physical Exam:   VS:  BP 118/76 (BP Location: Left Arm, Patient Position: Sitting, Cuff Size: Normal)   Pulse (!) 59   Resp 16   Ht 5' 3 (1.6 m)   Wt 115 lb 12.8 oz (52.5 kg)   BMI 20.51 kg/m    Wt Readings from Last 3 Encounters:  03/03/24 115 lb 12.8 oz (52.5 kg)  01/21/24 112 lb 12.8 oz (51.2 kg)  11/29/23 118 lb 12.8 oz (53.9 kg)    BP Readings from Last 3 Encounters:  03/03/24 118/76  01/21/24 128/78  11/29/23 116/78   Physical Exam Neck:     Vascular: No JVD.  Cardiovascular:     Rate and Rhythm: Normal rate and regular rhythm.     Pulses: Intact distal pulses.     Heart sounds: S1 normal and S2 normal. Murmur heard.     Mid to late systolic murmur is present with a grade of 2/6 at the apex.     No gallop.  Pulmonary:     Effort: Pulmonary effort is normal.     Breath sounds: Normal breath sounds.  Abdominal:     General: Bowel sounds are normal.     Palpations: Abdomen is soft.  Musculoskeletal:     Right lower leg: No edema.     Left lower leg: No edema.    EKG:    EKG Interpretation Date/Time:  Friday March 03 2024 10:48:47 EDT Ventricular Rate:  59 PR Interval:  144 QRS Duration:  128 QT Interval:  456 QTC Calculation: 451 R Axis:   86  Text  Interpretation: EKG 03/03/2024: Normal sinus rhythm at rate of 59 bpm, normal axis.  Right bundle branch block.  Otherwise normal EKG.  No significant change from 03/26/2023. Confirmed by Ling Flesch, Jagadeesh [phone]) on 03/03/2024 10:52:59 AM    ASSESSMENT AND PLAN: .      ICD-10-CM   1. Palpitations  R00.2 EKG 12-Lead    2. Malaise and fatigue  R53.81    R53.83     3. MVP (mitral valve prolapse)  I34.1     4. Loss of appetite for more than 2 weeks  R63.0       Assessment and Plan Assessment & Plan Palpitations and low blood pressure episodes Chronic palpitations with episodes  of hypotension, particularly postprandial and post-shower. Palpitations are benign, occurring at rest, and not during exertion. Hypotension likely due to atenolol  and weight loss. Explained that post-shower hypotension is a normal physiological response to vasodilation from heat exposure. - Change atenolol  dosing to morning to assess impact on palpitations and blood pressure. - If no improvement, consider splitting atenolol  dose to half in the morning and half at night. - Advise caution during showers to prevent falls due to hypotension. - Ensure adequate hydration.  Mitral valve prolapse with mild to moderate mitral regurgitation Mitral valve prolapse with mild to moderate regurgitation confirmed by recent TEE. Auscultation reveals a very mild murmur, not concerning for significant mitral regurgitation. No immediate intervention required unless symptoms such as increased dyspnea or changes in murmur occur. - Monitor for symptoms such as increased dyspnea or changes in murmur. - No need for dental prophylaxis.  Unintentional weight loss and decreased appetite Unintentional weight loss of approximately 20 pounds over the past year due to decreased appetite. No signs of malignancy. Reassurance provided regarding non-cardiac nature of symptoms. Discussed potential for appetite stimulant. - Refer to GI specialist for  evaluation and potential appetite stimulant. - Patient to send message to GI specialist regarding appetite stimulant options.  Fatigue and malaise Chronic fatigue and malaise, possibly related to sleep disturbances and decreased appetite. Non-cardiac in nature. Consideration of sleep evaluation if lifestyle modifications do not improve symptoms. - Recommend sleep hygiene improvements, including avoiding electronics before bed and establishing a consistent bedtime routine. - Consider melatonin in addition to trazodone  for sleep. - If no improvement, consider nocturnal oximetry to evaluate for sleep apnea/nocturnal hypoxemia.  Insomnia Difficulty falling asleep, possibly exacerbated by electronic use before bed. Currently using trazodone  for sleep with partial effectiveness. Discussed the importance of sleep hygiene and potential use of melatonin. - Advise against electronic use before bed, recommend reading or listening to music instead. - Continue trazodone  and consider adding melatonin. - Evaluate sleep habits over a month and reassess if no improvement.   Follow up: As needed unless lobe murmur, dyspnea on exertion or any clinical signs and symptoms of heart failure.  Consider repeating echocardiogram in 3 to 5 years  Signed,  Gordy Bergamo, MD, [facility] 03/03/2024, 11:24 AM [facility] [address] [place], [place] [address] Phone: [phone]. Fax:  [phone]

## 2024-03-08 DIAGNOSIS — I10 Essential (primary) hypertension: Secondary | ICD-10-CM | POA: Diagnosis not present

## 2024-03-09 DIAGNOSIS — L821 Other seborrheic keratosis: Secondary | ICD-10-CM | POA: Diagnosis not present

## 2024-03-09 DIAGNOSIS — D225 Melanocytic nevi of trunk: Secondary | ICD-10-CM | POA: Diagnosis not present

## 2024-03-09 DIAGNOSIS — L918 Other hypertrophic disorders of the skin: Secondary | ICD-10-CM | POA: Diagnosis not present

## 2024-03-09 DIAGNOSIS — D692 Other nonthrombocytopenic purpura: Secondary | ICD-10-CM | POA: Diagnosis not present

## 2024-03-09 DIAGNOSIS — L570 Actinic keratosis: Secondary | ICD-10-CM | POA: Diagnosis not present

## 2024-03-09 DIAGNOSIS — D1801 Hemangioma of skin and subcutaneous tissue: Secondary | ICD-10-CM | POA: Diagnosis not present

## 2024-04-03 ENCOUNTER — Encounter: Payer: Self-pay | Admitting: Nurse Practitioner

## 2024-04-06 LAB — VITAMIN D 25 HYDROXY (VIT D DEFICIENCY, FRACTURES): Vit D, 25-Hydroxy: 76 ng/mL (ref 30–100)

## 2024-04-06 LAB — BASIC METABOLIC PANEL WITHOUT GFR
BUN: 10 mg/dL (ref 7–25)
CO2: 29 mmol/L (ref 20–32)
Calcium: 10.5 mg/dL — ABNORMAL HIGH (ref 8.6–10.4)
Chloride: 98 mmol/L (ref 98–110)
Creat: 0.67 mg/dL (ref 0.60–1.00)
Glucose, Bld: 110 mg/dL (ref 65–139)
Potassium: 4 mmol/L (ref 3.5–5.3)
Sodium: 135 mmol/L (ref 135–146)

## 2024-04-06 LAB — MAGNESIUM: Magnesium: 2.1 mg/dL (ref 1.5–2.5)

## 2024-04-21 ENCOUNTER — Ambulatory Visit: Payer: Self-pay | Admitting: Nurse Practitioner

## 2024-05-06 ENCOUNTER — Other Ambulatory Visit: Payer: Self-pay | Admitting: Nurse Practitioner

## 2024-05-06 DIAGNOSIS — F5101 Primary insomnia: Secondary | ICD-10-CM

## 2024-05-13 ENCOUNTER — Other Ambulatory Visit: Payer: Self-pay | Admitting: Nurse Practitioner

## 2024-06-08 ENCOUNTER — Other Ambulatory Visit: Payer: Self-pay | Admitting: Nurse Practitioner
